# Patient Record
Sex: Female | Born: 1960
Health system: Southern US, Community
[De-identification: ages and names within clinical notes are randomized; demographics above are authoritative.]

## PROBLEM LIST (undated history)

## (undated) DIAGNOSIS — R0989 Other specified symptoms and signs involving the circulatory and respiratory systems: Secondary | ICD-10-CM

## (undated) DIAGNOSIS — H269 Unspecified cataract: Secondary | ICD-10-CM

## (undated) DIAGNOSIS — E119 Type 2 diabetes mellitus without complications: Secondary | ICD-10-CM

## (undated) DIAGNOSIS — E039 Hypothyroidism, unspecified: Secondary | ICD-10-CM

## (undated) DIAGNOSIS — M1990 Unspecified osteoarthritis, unspecified site: Secondary | ICD-10-CM

## (undated) DIAGNOSIS — E079 Disorder of thyroid, unspecified: Secondary | ICD-10-CM

## (undated) DIAGNOSIS — E785 Hyperlipidemia, unspecified: Secondary | ICD-10-CM

## (undated) DIAGNOSIS — R112 Nausea with vomiting, unspecified: Secondary | ICD-10-CM

## (undated) DIAGNOSIS — Z9889 Other specified postprocedural states: Secondary | ICD-10-CM

## (undated) DIAGNOSIS — J45909 Unspecified asthma, uncomplicated: Secondary | ICD-10-CM

## (undated) HISTORY — DX: Other specified symptoms and signs involving the circulatory and respiratory systems: R09.89

## (undated) HISTORY — PX: CATARACT EXTRACTION: SUR2

## (undated) HISTORY — PX: APPENDECTOMY: SHX54

## (undated) HISTORY — DX: Hyperlipidemia, unspecified: E78.5

## (undated) HISTORY — PX: BILATERAL CARPAL TUNNEL RELEASE: SHX6508

## (undated) HISTORY — DX: Type 2 diabetes mellitus without complications: E11.9

## (undated) HISTORY — DX: Unspecified cataract: H26.9

## (undated) HISTORY — PX: TUBAL LIGATION: SHX77

## (undated) HISTORY — DX: Disorder of thyroid, unspecified: E07.9

---

## 1998-01-28 ENCOUNTER — Other Ambulatory Visit: Admission: RE | Admit: 1998-01-28 | Discharge: 1998-01-28 | Payer: Self-pay | Admitting: Family Medicine

## 1999-10-29 ENCOUNTER — Encounter: Admission: RE | Admit: 1999-10-29 | Discharge: 2000-01-27 | Payer: Self-pay | Admitting: Endocrinology

## 2000-01-20 ENCOUNTER — Other Ambulatory Visit: Admission: RE | Admit: 2000-01-20 | Discharge: 2000-01-20 | Payer: Self-pay | Admitting: Family Medicine

## 2001-01-18 ENCOUNTER — Ambulatory Visit (HOSPITAL_BASED_OUTPATIENT_CLINIC_OR_DEPARTMENT_OTHER): Admission: RE | Admit: 2001-01-18 | Discharge: 2001-01-18 | Payer: Self-pay | Admitting: Orthopedic Surgery

## 2001-10-25 ENCOUNTER — Other Ambulatory Visit: Admission: RE | Admit: 2001-10-25 | Discharge: 2001-10-25 | Payer: Self-pay | Admitting: Family Medicine

## 2003-11-30 ENCOUNTER — Other Ambulatory Visit: Admission: RE | Admit: 2003-11-30 | Discharge: 2003-11-30 | Payer: Self-pay | Admitting: Family Medicine

## 2006-12-14 ENCOUNTER — Other Ambulatory Visit: Admission: RE | Admit: 2006-12-14 | Discharge: 2006-12-14 | Payer: Self-pay | Admitting: Family Medicine

## 2011-06-02 ENCOUNTER — Encounter (INDEPENDENT_AMBULATORY_CARE_PROVIDER_SITE_OTHER): Payer: Self-pay | Admitting: Internal Medicine

## 2011-06-02 ENCOUNTER — Ambulatory Visit (INDEPENDENT_AMBULATORY_CARE_PROVIDER_SITE_OTHER): Payer: BC Managed Care – PPO | Admitting: Internal Medicine

## 2011-06-02 VITALS — BP 130/60 | Temp 98.2°F | Ht 67.0 in | Wt 165.9 lb

## 2011-06-02 DIAGNOSIS — R197 Diarrhea, unspecified: Secondary | ICD-10-CM

## 2011-06-02 DIAGNOSIS — O2432 Unspecified pre-existing diabetes mellitus in childbirth: Secondary | ICD-10-CM

## 2011-06-02 NOTE — Patient Instructions (Signed)
Stop Miralax. Fiber pills. Try FiberOne bars. Stool diary and take pictures. OV in 1 month.

## 2011-06-02 NOTE — Progress Notes (Signed)
Subjective:     Patient ID: Alejandra Mcdonald, female   DOB: Dec 24, 1960, 50 y.o.   MRN: [id number]  HPI  Alejandra Mcdonald presents today with c/o  diarrhea  She says her stools are loose. She is having one loose stool a week.  She says she is passing a lot of gas.  She says her stools are never solid. This has been going on for about 5 months. She is having a BM about once a week.  She did take a Doculax this weekend and it was liquid.   Her appetite is good. No weight loss. No melena or bright red rectal bleeding.   She says she was having a BM about twice a week 5 months ago .Her last colonoscopy was 3-4 yrs ago and was normal by Dr. Gabriel Cirri. She has not been on recent antibiotics and she has not been out of the country.  Review of Systems See hpi Current Outpatient Prescriptions  Medication Sig Dispense Refill  . aspirin 81 MG tablet Take 81 mg by mouth daily.        . calcium citrate-vitamin D 200-200 MG-UNIT TABS Take by mouth daily.        . insulin lispro (HUMALOG) 100 UNIT/ML injection Inject into the skin 3 (three) times daily before meals.        Marland Kitchen levothyroxine (SYNTHROID, LEVOTHROID) 175 MCG tablet Take 175 mcg by mouth daily.        . Multiple Vitamins-Minerals (MULTIVITAMIN,TX-MINERALS) tablet Take 1 tablet by mouth daily.        . polyethylene glycol (MIRALAX / GLYCOLAX) packet Take 17 g by mouth daily.        . rosuvastatin (CRESTOR) 10 MG tablet Take 10 mg by mouth daily.         her History   Social History Narrative  . No narrative on file   History   Social History  . Marital Status: Married    Spouse Name: N/A    Number of Children: N/A  . Years of Education: N/A   Occupational History  . Not on file.   Social History Main Topics  . Smoking status: Never Smoker   . Smokeless tobacco: Not on file  . Alcohol Use: No  . Drug Use: No  . Sexually Active: Not on file   Other Topics Concern  . Not on file   Social History Narrative  . No narrative on file   No family  history on file. No family status information on file.   No Known Allergies     Objective:   Physical Exam   Filed Vitals:   06/02/11 1619  BP: 130/60  Temp: 98.2 F (36.8 C)  Height: 5\' 7"  (1.702 m)  Weight: 165 lb 14.4 oz (75.252 kg)    Alert and oriented. Skin warm and dry. Oral mucosa is moist. Natural teeth in good condition. Sclera anicteric, conjunctivae is pink. Thyroid not enlarged. No cervical lymphadenopathy. Lungs clear. Heart regular rate and rhythm.  Abdomen is soft. Bowel sounds are positive. No hepatomegaly. No abdominal masses felt. No tenderness.  No edema to lower extremities. Patient is alert and oriented. Stool brown and guaiac negative.     Assessment:    Diarrhea, change in stools. Stool guaiac negative today. No impaction.     Plan:     Fiber daily. Stop Miralax. Stool diary. Take pictures of stool when you have one.   I discussed this case with Dr. Karilyn Cota. She will have  OV in one month.

## 2011-06-30 ENCOUNTER — Ambulatory Visit (INDEPENDENT_AMBULATORY_CARE_PROVIDER_SITE_OTHER): Payer: BC Managed Care – PPO | Admitting: Internal Medicine

## 2012-11-07 ENCOUNTER — Encounter: Payer: Self-pay | Admitting: General Practice

## 2012-11-07 ENCOUNTER — Ambulatory Visit (INDEPENDENT_AMBULATORY_CARE_PROVIDER_SITE_OTHER): Payer: BC Managed Care – PPO | Admitting: General Practice

## 2012-11-07 VITALS — BP 130/73 | HR 78 | Temp 98.1°F | Ht 67.0 in | Wt 158.0 lb

## 2012-11-07 DIAGNOSIS — L0390 Cellulitis, unspecified: Secondary | ICD-10-CM

## 2012-11-07 DIAGNOSIS — L0291 Cutaneous abscess, unspecified: Secondary | ICD-10-CM

## 2012-11-07 MED ORDER — CIPROFLOXACIN HCL 500 MG PO TABS: 500.0000 mg | ORAL_TABLET | Freq: Two times a day (BID) | ORAL | Status: DC

## 2012-11-07 NOTE — Patient Instructions (Addendum)
Abscess An abscess is an infected area that contains a collection of pus and debris.It can occur in almost any part of the body. An abscess is also known as a furuncle or boil. CAUSES  An abscess occurs when tissue gets infected. This can occur from blockage of oil or sweat glands, infection of hair follicles, or a minor injury to the skin. As the body tries to fight the infection, pus collects in the area and creates pressure under the skin. This pressure causes pain. People with weakened immune systems have difficulty fighting infections and get certain abscesses more often.  SYMPTOMS Usually an abscess develops on the skin and becomes a painful mass that is red, warm, and tender. If the abscess forms under the skin, you may feel a moveable soft area under the skin. Some abscesses break open (rupture) on their own, but most will continue to get worse without care. The infection can spread deeper into the body and eventually into the bloodstream, causing you to feel ill.  DIAGNOSIS  Your caregiver will take your medical history and perform a physical exam. A sample of fluid may also be taken from the abscess to determine what is causing your infection. TREATMENT  Your caregiver may prescribe antibiotic medicines to fight the infection. However, taking antibiotics alone usually does not cure an abscess. Your caregiver may need to make a small cut (incision) in the abscess to drain the pus. In some cases, gauze is packed into the abscess to reduce pain and to continue draining the area. HOME CARE INSTRUCTIONS   Only take over-the-counter or prescription medicines for pain, discomfort, or fever as directed by your caregiver.  If you were prescribed antibiotics, take them as directed. Finish them even if you start to feel better.  If gauze is used, follow your caregiver's directions for changing the gauze.  To avoid spreading the infection:  Keep your draining abscess covered with a  bandage.  Wash your hands well.  Do not share personal care items, towels, or whirlpools with others.  Avoid skin contact with others.  Keep your skin and clothes clean around the abscess.  Keep all follow-up appointments as directed by your caregiver. SEEK MEDICAL CARE IF:   You have increased pain, swelling, redness, fluid drainage, or bleeding.  You have muscle aches, chills, or a general ill feeling.  You have a fever. MAKE SURE YOU:   Understand these instructions.  Will watch your condition.  Will get help right away if you are not doing well or get worse. Document Released: 05/13/2005 Document Revised: 02/02/2012 Document Reviewed: 10/16/2011 [employer] Patient Information 2013 [employer]. Cellulitis Cellulitis is an infection of the skin and the tissue beneath it. The infected area is usually red and tender. Cellulitis occurs most often in the arms and lower legs.  CAUSES  Cellulitis is caused by bacteria that enter the skin through cracks or cuts in the skin. The most common types of bacteria that cause cellulitis are Staphylococcus and Streptococcus. SYMPTOMS   Redness and warmth.  Swelling.  Tenderness or pain.  Fever. DIAGNOSIS  Your caregiver can usually determine what is wrong based on a physical exam. Blood tests may also be done. TREATMENT  Treatment usually involves taking an antibiotic medicine. HOME CARE INSTRUCTIONS   Take your antibiotics as directed. Finish them even if you start to feel better.  Keep the infected arm or leg elevated to reduce swelling.  Apply a warm cloth to the affected area up to 4   times per day to relieve pain.  Only take over-the-counter or prescription medicines for pain, discomfort, or fever as directed by your caregiver.  Keep all follow-up appointments as directed by your caregiver. SEEK MEDICAL CARE IF:   You notice red streaks coming from the infected area.  Your red area gets larger or turns dark in  color.  Your bone or joint underneath the infected area becomes painful after the skin has healed.  Your infection returns in the same area or another area.  You notice a swollen bump in the infected area.  You develop new symptoms. SEEK IMMEDIATE MEDICAL CARE IF:   You have a fever.  You feel very sleepy.  You develop vomiting or diarrhea.  You have a general ill feeling (malaise) with muscle aches and pains. MAKE SURE YOU:   Understand these instructions.  Will watch your condition.  Will get help right away if you are not doing well or get worse. Document Released: 05/13/2005 Document Revised: 02/02/2012 Document Reviewed: 10/19/2011 [employer] Patient Information 2013 [employer].   

## 2012-11-07 NOTE — Progress Notes (Signed)
  Subjective:    Patient ID: Alejandra Mcdonald, female    DOB: February 15, 1961, 52 y.o.   MRN: [id number]  HPI Patient reports area to abdomen (right lower) was red and tender upon removing needle from insulin pump. This was noticed on Wednesday morning. Reports greenish drainage, very small amount which only occurred once. Patient reports cleansing area with soap water, peroxide, and applied neosporin. Reports changing sites every three days. Reports pain in area, worse with clothes rubbing against site.     Review of Systems  Constitutional: Negative for fever and chills.  Respiratory: Negative for chest tightness and shortness of breath.   Cardiovascular: Negative for chest pain and palpitations.  Gastrointestinal: Negative for abdominal pain and abdominal distention.  Genitourinary: Negative for difficulty urinating.  Skin:       Right lower abdomen area red, swollen, and warm to touch.   Neurological: Negative for dizziness and headaches.  Psychiatric/Behavioral: Positive for behavioral problems.       Objective:   Physical Exam  Constitutional: She is oriented to person, place, and time. She appears well-developed and well-nourished.  Cardiovascular: Normal rate, regular rhythm, normal heart sounds and intact distal pulses.   No murmur heard. Pulmonary/Chest: Effort normal and breath sounds normal. No respiratory distress. She exhibits no tenderness.  Abdominal: Soft. Bowel sounds are normal. She exhibits no distension. There is no tenderness.  Neurological: She is alert and oriented to person, place, and time.  Skin: Skin is warm and dry. There is erythema.  Right lower quadrant area has erythematous, firm, 1 inch diameter and warm to touch. Negative drainage noted.   Psychiatric: She has a normal mood and affect.          Assessment & Plan:  Complete antibiotics even if feeling better Increase fluid intake Return to office prn Keep appointment scheduled for next week  Raymon Mutton, FNP-C

## 2012-11-12 ENCOUNTER — Encounter: Payer: Self-pay | Admitting: Physician Assistant

## 2012-11-12 ENCOUNTER — Ambulatory Visit (INDEPENDENT_AMBULATORY_CARE_PROVIDER_SITE_OTHER): Payer: BC Managed Care – PPO | Admitting: Physician Assistant

## 2012-11-12 VITALS — BP 120/70 | HR 71 | Temp 98.0°F | Ht 67.0 in | Wt 157.0 lb

## 2012-11-12 DIAGNOSIS — L0291 Cutaneous abscess, unspecified: Secondary | ICD-10-CM

## 2012-11-12 DIAGNOSIS — E119 Type 2 diabetes mellitus without complications: Secondary | ICD-10-CM

## 2012-11-12 MED ORDER — SULFAMETHOXAZOLE-TRIMETHOPRIM 800-160 MG PO TABS: 1.0000 | ORAL_TABLET | Freq: Two times a day (BID) | ORAL | Status: DC

## 2012-11-14 NOTE — Progress Notes (Signed)
  Subjective:    Patient ID: Alejandra Mcdonald, female    DOB: 1961/01/15, 52 y.o.   MRN: [id number]  HPI Diabetic, using an insulin pump; red warm lesion on RLQ of abdomin at site of insulin pump; using Cipro    Review of Systems  Constitutional: Positive for chills.  Skin: Positive for color change, pallor and wound.  All other systems reviewed and are negative.       Objective:   Physical Exam  Vitals reviewed. Constitutional: She is oriented to person, place, and time. She appears well-developed and well-nourished.  Neurological: She is alert and oriented to person, place, and time.  Skin: There is erythema.  6cm erythematous lesion on RLQ abdomin  Psychiatric: She has a normal mood and affect. Her behavior is normal. Judgment and thought content normal.          Assessment & Plan:  Cellulitis and abscess - Plan: sulfamethoxazole-trimethoprim (BACTRIM DS,SEPTRA DS) 800-160 MG per tablet  Diabetes - Plan: sulfamethoxazole-trimethoprim (BACTRIM DS,SEPTRA DS) 800-160 MG per tablet

## 2012-11-18 ENCOUNTER — Ambulatory Visit (INDEPENDENT_AMBULATORY_CARE_PROVIDER_SITE_OTHER): Payer: BC Managed Care – PPO | Admitting: Nurse Practitioner

## 2012-11-18 ENCOUNTER — Encounter: Payer: Self-pay | Admitting: Nurse Practitioner

## 2012-11-18 VITALS — BP 120/69 | HR 70 | Temp 98.4°F | Ht 67.0 in | Wt 155.0 lb

## 2012-11-18 DIAGNOSIS — E109 Type 1 diabetes mellitus without complications: Secondary | ICD-10-CM

## 2012-11-18 DIAGNOSIS — K219 Gastro-esophageal reflux disease without esophagitis: Secondary | ICD-10-CM

## 2012-11-18 DIAGNOSIS — E039 Hypothyroidism, unspecified: Secondary | ICD-10-CM

## 2012-11-18 DIAGNOSIS — K5900 Constipation, unspecified: Secondary | ICD-10-CM

## 2012-11-18 DIAGNOSIS — E1069 Type 1 diabetes mellitus with other specified complication: Secondary | ICD-10-CM | POA: Insufficient documentation

## 2012-11-18 DIAGNOSIS — E119 Type 2 diabetes mellitus without complications: Secondary | ICD-10-CM | POA: Insufficient documentation

## 2012-11-18 DIAGNOSIS — E785 Hyperlipidemia, unspecified: Secondary | ICD-10-CM | POA: Insufficient documentation

## 2012-11-18 LAB — COMPLETE METABOLIC PANEL WITH GFR
Albumin: 4.2 g/dL (ref 3.5–5.2)
BUN: 10 mg/dL (ref 6–23)
CO2: 31 mEq/L (ref 19–32)
Calcium: 10 mg/dL (ref 8.4–10.5)
Chloride: 100 mEq/L (ref 96–112)
GFR, Est Non African American: 72 mL/min
Glucose, Bld: 153 mg/dL — ABNORMAL HIGH (ref 70–99)
Potassium: 4.7 mEq/L (ref 3.5–5.3)

## 2012-11-18 LAB — THYROID PANEL WITH TSH: Free Thyroxine Index: 4.2 — ABNORMAL HIGH (ref 1.0–3.9)

## 2012-11-18 MED ORDER — SIMVASTATIN 10 MG PO TABS: 10.0000 mg | ORAL_TABLET | Freq: Every day | ORAL | Status: DC

## 2012-11-18 MED ORDER — LEVOTHYROXINE SODIUM 175 MCG PO TABS: 175.0000 ug | ORAL_TABLET | Freq: Every day | ORAL | Status: DC

## 2012-11-18 MED ORDER — LACTULOSE 20 GM/30ML PO SOLN: 17.0000 g | Freq: Three times a day (TID) | ORAL | Status: DC

## 2012-11-18 MED ORDER — OMEPRAZOLE 40 MG PO CPDR: 40.0000 mg | DELAYED_RELEASE_CAPSULE | Freq: Every day | ORAL | Status: DC

## 2012-11-18 NOTE — Progress Notes (Signed)
Subjective:    Patient ID: Alejandra Mcdonald, female    DOB: 1960/08/27, 52 y.o.   MRN: [id number]  Diabetes She presents for her follow-up diabetic visit. She has type 1 diabetes mellitus. No MedicAlert identification noted. The initial diagnosis of diabetes was made 33 years ago. Her disease course has been fluctuating. There are no hypoglycemic associated symptoms. Pertinent negatives for hypoglycemia include no confusion, dizziness, nervousness/anxiousness or tremors. Pertinent negatives for diabetes include no blurred vision, no chest pain, no fatigue, no foot ulcerations, no polydipsia, no polyphagia, no weakness and no weight loss. There are no hypoglycemic complications. Symptoms are stable. There are no diabetic complications. Risk factors for coronary artery disease include dyslipidemia. Current diabetic treatment includes insulin pump. She is compliant with treatment all of the time. Her weight is stable. She is following a generally healthy diet. When asked about meal planning, she reported none. She has not had a previous visit with a dietician. She participates in exercise daily. She monitors blood glucose at home 5+ x per day. Her home blood glucose trend is fluctuating dramatically. Her overall blood glucose range is >200 mg/dl. She does not see a podiatrist.Eye exam is current.  Hyperlipidemia This is a chronic problem. The current episode started more than 1 year ago. The problem is uncontrolled. Exacerbating diseases include diabetes and hypothyroidism. Pertinent negatives include no chest pain, leg pain, myalgias or shortness of breath. Current antihyperlipidemic treatment includes statins. There are no compliance problems.  Risk factors for coronary artery disease include diabetes mellitus.  Hypothyroidism This is a chronic problem. Currently on Zocor. Taking daily as Rx. Exercises 5x/week. Walks 10,000 steps a day. Weight watchers diet  Complaint of dry cough, worse at night and while  lying down. Was on Ace-Inhibitor but stopped due to cough but cough still remains.  Review of Systems  Constitutional: Negative for weight loss and fatigue.  Eyes: Negative for blurred vision.  Respiratory: Positive for cough (Nonproductive). Negative for shortness of breath.   Cardiovascular: Negative for chest pain.  Endocrine: Negative for polydipsia and polyphagia.  Musculoskeletal: Negative for myalgias.  Neurological: Negative for dizziness, tremors and weakness.  Psychiatric/Behavioral: Negative for confusion. The patient is not nervous/anxious.   All other systems reviewed and are negative.       Objective:   Physical Exam  Constitutional: She is oriented to person, place, and time. She appears well-developed and well-nourished.  HENT:  Head: Normocephalic.  Right Ear: External ear normal.  Left Ear: External ear normal.  Nose: Nose normal.  Mouth/Throat: Oropharynx is clear and moist.  Eyes: Conjunctivae and EOM are normal. Pupils are equal, round, and reactive to light.  Neck: Normal range of motion. Neck supple.  Cardiovascular: Normal rate, regular rhythm, normal heart sounds and intact distal pulses.   Pulmonary/Chest: Effort normal and breath sounds normal.  Abdominal: Soft. Bowel sounds are normal.  Musculoskeletal: Normal range of motion.  Neurological: She is alert and oriented to person, place, and time.  Skin: Skin is warm and dry.  Psychiatric: She has a normal mood and affect. Her behavior is normal. Judgment and thought content normal.     Results for orders placed in visit on 11/18/12  POCT GLYCOSYLATED HEMOGLOBIN (HGB A1C)      Result Value Range   Hemoglobin A1C 7.7     BP 120/69  Pulse 70  Temp(Src) 98.4 F (36.9 C) (Oral)  Ht 5\' 7"  (1.702 m)  Wt 155 lb (70.308 kg)  BMI 24.27 kg/m2  Assessment & Plan:  1. Other and unspecified hyperlipidemia Continue diet and exercise - COMPLETE METABOLIC PANEL WITH GFR - NMR Lipoprofile with  Lipids - simvastatin (ZOCOR) 10 MG tablet; Take 1 tablet (10 mg total) by mouth at bedtime.  Dispense: 30 tablet; Refill: 5  2. Unspecified hypothyroidism  - COMPLETE METABOLIC PANEL WITH GFR - NMR Lipoprofile with Lipids - Thyroid Panel With TSH - levothyroxine (SYNTHROID, LEVOTHROID) 175 MCG tablet; Take 1 tablet (175 mcg total) by mouth daily.  Dispense: 30 tablet; Refill: 5   3. Type 1 diabetes mellitus with target hemoglobin A1c of less than 7.5 percent Pt to increase basal dose Call if blood sugars not improving - Microalbumin, urine - POCT glycosylated hemoglobin (Hb A1C) - COMPLETE METABOLIC PANEL WITH GFR  5. GERD (gastroesophageal reflux disease) Avoid spicy and fatty foods Avoid eating 2 hrs prior to bedtime - omeprazole (PRILOSEC) 40 MG capsule; Take 1 capsule (40 mg total) by mouth daily.  Dispense: 30 capsule; Refill: 5  6. Unspecified constipation Increase fiber - Lactulose 20 GM/30ML SOLN; Take 25.5 mLs (17 g total) by mouth 3 (three) times daily.  Dispense: 90 mL; Refill: 5  Mary-Margaret Daphine Deutscher, FNP

## 2012-11-18 NOTE — Patient Instructions (Addendum)
Other and unspecified hyperlipidemia Continue diet and exercise - COMPLETE METABOLIC PANEL WITH GFR - NMR Lipoprofile with Lipids - simvastatin (ZOCOR) 10 MG tablet; Take 1 tablet (10 mg total) by mouth at bedtime.  Dispense: 30 tablet; Refill: 5  2. Unspecified hypothyroidism  - COMPLETE METABOLIC PANEL WITH GFR - NMR Lipoprofile with Lipids - Thyroid Panel With TSH - levothyroxine (SYNTHROID, LEVOTHROID) 175 MCG tablet; Take 1 tablet (175 mcg total) by mouth daily.  Dispense: 30 tablet; Refill: 5   3. Type 1 diabetes mellitus with target hemoglobin A1c of less than 7.5 percent Pt to increase basal dose Call if blood sugars not improving - Microalbumin, urine - POCT glycosylated hemoglobin (Hb A1C) - COMPLETE METABOLIC PANEL WITH GFR  5. GERD (gastroesophageal reflux disease) Avoid spicy and fatty foods Avoid eating 2 hrs prior to bedtime - omeprazole (PRILOSEC) 40 MG capsule; Take 1 capsule (40 mg total) by mouth daily.  Dispense: 30 capsule; Refill: 5  6. Unspecified constipation Increase fiber - Lactulose 20 GM/30ML SOLN; Take 25.5 mLs (17 g total) by mouth 3 (three) times daily.  Dispense: 90 mL; Refill: 5

## 2012-11-19 LAB — MICROALBUMIN, URINE: Microalb, Ur: 0.5 mg/dL (ref 0.00–1.89)

## 2012-11-21 ENCOUNTER — Other Ambulatory Visit: Payer: Self-pay | Admitting: Nurse Practitioner

## 2012-11-21 LAB — NMR LIPOPROFILE WITH LIPIDS
HDL Size: 10 nm (ref >=9.2)
LDL Particle Number: 739 nmol/L (ref <1000)
Large HDL-P: 14 umol/L (ref >=4.8)
Large VLDL-P: 0.8 nmol/L (ref <=2.7)
Small LDL Particle Number: 172 nmol/L (ref <=527)
VLDL Size: 43 nm (ref <=46.6)

## 2012-11-21 MED ORDER — LEVOTHYROXINE SODIUM 150 MCG PO TABS: 150.0000 ug | ORAL_TABLET | Freq: Every day | ORAL | Status: DC

## 2012-12-28 ENCOUNTER — Other Ambulatory Visit: Payer: Self-pay | Admitting: Nurse Practitioner

## 2013-01-23 ENCOUNTER — Other Ambulatory Visit: Payer: Self-pay | Admitting: Nurse Practitioner

## 2013-02-14 ENCOUNTER — Ambulatory Visit: Payer: BC Managed Care – PPO | Admitting: Family Medicine

## 2013-02-20 ENCOUNTER — Telehealth: Payer: Self-pay | Admitting: Family Medicine

## 2013-02-21 ENCOUNTER — Ambulatory Visit: Payer: BC Managed Care – PPO | Admitting: Nurse Practitioner

## 2013-03-17 ENCOUNTER — Other Ambulatory Visit: Payer: Self-pay | Admitting: Nurse Practitioner

## 2013-03-17 NOTE — Telephone Encounter (Signed)
Last seen 11/18/12 MMM last glucose level 11/18/12

## 2013-04-10 ENCOUNTER — Ambulatory Visit (INDEPENDENT_AMBULATORY_CARE_PROVIDER_SITE_OTHER): Payer: BC Managed Care – PPO | Admitting: Nurse Practitioner

## 2013-04-10 ENCOUNTER — Encounter: Payer: Self-pay | Admitting: Nurse Practitioner

## 2013-04-10 VITALS — BP 137/65 | HR 95 | Temp 97.7°F | Ht 67.0 in | Wt 158.0 lb

## 2013-04-10 DIAGNOSIS — M4306 Spondylolysis, lumbar region: Secondary | ICD-10-CM

## 2013-04-10 DIAGNOSIS — E785 Hyperlipidemia, unspecified: Secondary | ICD-10-CM

## 2013-04-10 DIAGNOSIS — E109 Type 1 diabetes mellitus without complications: Secondary | ICD-10-CM

## 2013-04-10 DIAGNOSIS — M4310 Spondylolisthesis, site unspecified: Secondary | ICD-10-CM

## 2013-04-10 DIAGNOSIS — E039 Hypothyroidism, unspecified: Secondary | ICD-10-CM

## 2013-04-10 DIAGNOSIS — K5900 Constipation, unspecified: Secondary | ICD-10-CM

## 2013-04-10 LAB — POCT GLYCOSYLATED HEMOGLOBIN (HGB A1C): Hemoglobin A1C: 6.7

## 2013-04-10 MED ORDER — SIMVASTATIN 40 MG PO TABS: 40.0000 mg | ORAL_TABLET | Freq: Every evening | ORAL | Status: DC

## 2013-04-10 MED ORDER — LINACLOTIDE 145 MCG PO CAPS: 145.0000 ug | ORAL_CAPSULE | Freq: Every day | ORAL | Status: DC

## 2013-04-10 MED ORDER — INSULIN GLULISINE 100 UNIT/ML IJ SOLN: INTRAMUSCULAR | Status: DC

## 2013-04-10 MED ORDER — CELECOXIB 200 MG PO CAPS: 200.0000 mg | ORAL_CAPSULE | Freq: Every day | ORAL | Status: DC

## 2013-04-10 MED ORDER — LEVOTHYROXINE SODIUM 200 MCG PO TABS: 200.0000 ug | ORAL_TABLET | Freq: Every day | ORAL | Status: DC

## 2013-04-10 NOTE — Progress Notes (Signed)
Subjective:    Patient ID: Alejandra Mcdonald, female    DOB: 24-Mar-1961, 52 y.o.   MRN: [id number]  Diabetes She presents for her follow-up diabetic visit. She has type 1 diabetes mellitus. No MedicAlert identification noted. The initial diagnosis of diabetes was made 33 years ago. Her disease course has been fluctuating. There are no hypoglycemic associated symptoms. Pertinent negatives for hypoglycemia include no confusion, dizziness, nervousness/anxiousness or tremors. Pertinent negatives for diabetes include no blurred vision, no chest pain, no fatigue, no foot ulcerations, no polydipsia, no polyphagia, no weakness and no weight loss. There are no hypoglycemic complications. Symptoms are stable. There are no diabetic complications. Risk factors for coronary artery disease include dyslipidemia. Current diabetic treatment includes insulin pump. She is compliant with treatment all of the time. Her weight is stable. She is following a generally healthy diet. When asked about meal planning, she reported none. She has not had a previous visit with a dietician. She participates in exercise daily. She monitors blood glucose at home 5+ x per day. Her home blood glucose trend is fluctuating dramatically. Her breakfast blood glucose is taken between 8-9 am. Her breakfast blood glucose range is generally 110-130 mg/dl. Her lunch blood glucose is taken between 1-2 pm. Her lunch blood glucose range is generally 130-140 mg/dl. Her dinner blood glucose is taken between 7-8 pm. Her dinner blood glucose range is generally 130-140 mg/dl. Her highest blood glucose is >200 mg/dl. Her overall blood glucose range is 130-140 mg/dl. She does not see a podiatrist.Eye exam is current.  Hyperlipidemia This is a chronic problem. The current episode started more than 1 year ago. The problem is uncontrolled. Exacerbating diseases include diabetes and hypothyroidism. Pertinent negatives include no chest pain, leg pain, myalgias or shortness  of breath. Current antihyperlipidemic treatment includes statins. There are no compliance problems.  Risk factors for coronary artery disease include diabetes mellitus.  Hypothyroidism This is a chronic problem. Currently on Zocor. Taking daily as Rx. Exercises 5x/week. Walks 10,000 steps a day. Weight watchers diet Lumbar Pars defect Recently diagnosed and is going to see specialist in  About 2 weeks. Constipation Wants to try linzess- Patient says she stays constipated Review of Systems  Constitutional: Negative for weight loss and fatigue.  Eyes: Negative for blurred vision.  Respiratory: Positive for cough (Nonproductive). Negative for shortness of breath.   Cardiovascular: Negative for chest pain.  Endocrine: Negative for polydipsia and polyphagia.  Musculoskeletal: Negative for myalgias.  Neurological: Negative for dizziness, tremors and weakness.  Psychiatric/Behavioral: Negative for confusion. The patient is not nervous/anxious.   All other systems reviewed and are negative.       Objective:   Physical Exam  Constitutional: She is oriented to person, place, and time. She appears well-developed and well-nourished.  HENT:  Head: Normocephalic.  Right Ear: External ear normal.  Left Ear: External ear normal.  Nose: Nose normal.  Mouth/Throat: Oropharynx is clear and moist.  Eyes: Conjunctivae and EOM are normal. Pupils are equal, round, and reactive to light.  Neck: Normal range of motion. Neck supple.  Cardiovascular: Normal rate, regular rhythm, normal heart sounds and intact distal pulses.   Pulmonary/Chest: Effort normal and breath sounds normal.  Abdominal: Soft. Bowel sounds are normal.  Musculoskeletal: Normal range of motion.  Neurological: She is alert and oriented to person, place, and time.  Skin: Skin is warm and dry.  Psychiatric: She has a normal mood and affect. Her behavior is normal. Judgment and thought content normal.  BP 137/65  Pulse 95   Temp(Src) 97.7 F (36.5 C) (Oral)  Ht 5\' 7"  (1.702 m)  Wt 158 lb (71.668 kg)  BMI 24.74 kg/m2     Results for orders placed in visit on 04/10/13  POCT GLYCOSYLATED HEMOGLOBIN (HGB A1C)      Result Value Range   Hemoglobin A1C 6.7%      Assessment & Plan:  1. Other and unspecified hyperlipidemia Continue diet and exercise - COMPLETE METABOLIC PANEL WITH GFR - NMR Lipoprofile with Lipids - simvastatin (ZOCOR) 10 MG tablet; Take 1 tablet (10 mg total) by mouth at bedtime.  Dispense: 30 tablet; Refill: 5  2. Unspecified hypothyroidism  - COMPLETE METABOLIC PANEL WITH GFR - NMR Lipoprofile with Lipids - Thyroid Panel With TSH - levothyroxine (SYNTHROID, LEVOTHROID) 175 MCG tablet; Take 1 tablet (175 mcg total) by mouth daily.  Dispense: 30 tablet; Refill: 5   3. Type 1 diabetes mellitus with target hemoglobin A1c of less than 7.5 percent Pt to increase basal dose Call if blood sugars not improving - Microalbumin, urine - POCT glycosylated hemoglobin (Hb A1C) - COMPLETE METABOLIC PANEL WITH GFR  5. GERD (gastroesophageal reflux disease) Avoid spicy and fatty foods Avoid eating 2 hrs prior to bedtime - omeprazole (PRILOSEC) 40 MG capsule; Take 1 capsule (40 mg total) by mouth daily.  Dispense: 30 capsule; Refill: 5  6. Unspecified constipation Increase fiber -linzess 145mg  1 po qd samples  Mary-Margaret Daphine Deutscher, FNP

## 2013-04-10 NOTE — Patient Instructions (Addendum)

## 2013-04-12 LAB — SPECIMEN STATUS REPORT

## 2013-04-12 LAB — CMP14+EGFR
AST: 24 IU/L (ref 0–40)
Albumin/Globulin Ratio: 2.4 (ref 1.1–2.5)
BUN: 10 mg/dL (ref 6–24)
CO2: 27 mmol/L (ref 18–29)
Calcium: 9.6 mg/dL (ref 8.7–10.2)
Creatinine, Ser: 0.75 mg/dL (ref 0.57–1.00)
GFR calc non Af Amer: 91 mL/min/{1.73_m2} (ref >59)
Globulin, Total: 1.7 g/dL (ref 1.5–4.5)
Glucose: 243 mg/dL — ABNORMAL HIGH (ref 65–99)
Sodium: 140 mmol/L (ref 134–144)
Total Protein: 5.8 g/dL — ABNORMAL LOW (ref 6.0–8.5)

## 2013-04-12 LAB — NMR, LIPOPROFILE
HDL Cholesterol by NMR: 84 mg/dL (ref >=40)
HDL Particle Number: 44.9 umol/L (ref >=30.5)
LDLC SERPL CALC-MCNC: 86 mg/dL (ref <100)
Small LDL Particle Number: <90 nmol/L (ref <=527)
Triglycerides by NMR: 132 mg/dL (ref <150)

## 2013-04-12 LAB — THYROID PANEL WITH TSH
Free Thyroxine Index: 3.1 (ref 1.2–4.9)
T4, Total: 8.7 ug/dL (ref 4.5–12.0)
TSH: 0.073 u[IU]/mL — ABNORMAL LOW (ref 0.450–4.500)

## 2013-04-24 ENCOUNTER — Telehealth: Payer: Self-pay | Admitting: Nurse Practitioner

## 2013-04-24 DIAGNOSIS — K5900 Constipation, unspecified: Secondary | ICD-10-CM

## 2013-04-24 NOTE — Telephone Encounter (Signed)
Give linzess samples 240 mg

## 2013-04-25 MED ORDER — LINACLOTIDE 145 MCG PO CAPS: 145.0000 ug | ORAL_CAPSULE | Freq: Every day | ORAL | Status: DC

## 2013-04-25 NOTE — Telephone Encounter (Signed)
Currently do not have any samples

## 2013-04-25 NOTE — Telephone Encounter (Signed)
Only have samples of 290mg 

## 2013-04-25 NOTE — Telephone Encounter (Signed)
Can you please call it in then?  She also has very chapped lips her dentist gave her her loungers but it made it worse can  You please think of something else she can do?

## 2013-04-25 NOTE — Telephone Encounter (Signed)
vaseline for lips Linzess rx sent to pharmacy

## 2013-06-07 ENCOUNTER — Encounter: Payer: Self-pay | Admitting: Family Medicine

## 2013-06-07 ENCOUNTER — Ambulatory Visit (INDEPENDENT_AMBULATORY_CARE_PROVIDER_SITE_OTHER): Payer: BC Managed Care – PPO | Admitting: Family Medicine

## 2013-06-07 VITALS — BP 139/76 | HR 80 | Temp 98.5°F | Ht 66.5 in | Wt 162.4 lb

## 2013-06-07 DIAGNOSIS — M129 Arthropathy, unspecified: Secondary | ICD-10-CM

## 2013-06-07 DIAGNOSIS — M1990 Unspecified osteoarthritis, unspecified site: Secondary | ICD-10-CM

## 2013-06-07 MED ORDER — MELOXICAM 7.5 MG PO TABS: 7.5000 mg | ORAL_TABLET | Freq: Every day | ORAL | Status: DC

## 2013-06-07 MED ORDER — KETOROLAC TROMETHAMINE 30 MG/ML IJ SOLN
30.0000 mg | Freq: Once | INTRAMUSCULAR | Status: AC
  Administered 2013-06-07: 30 mg via INTRAMUSCULAR

## 2013-06-07 NOTE — Patient Instructions (Addendum)
Shoulder Pain The shoulder is the joint that connects your arms to your body. The bones that form the shoulder joint include the upper arm bone (humerus), the shoulder blade (scapula), and the collarbone (clavicle). The top of the humerus is shaped like a ball and fits into a rather flat socket on the scapula (glenoid cavity). A combination of muscles and strong, fibrous tissues that connect muscles to bones (tendons) support your shoulder joint and hold the ball in the socket. Small, fluid-filled sacs (bursae) are located in different areas of the joint. They act as cushions between the bones and the overlying soft tissues and help reduce friction between the gliding tendons and the bone as you move your arm. Your shoulder joint allows a wide range of motion in your arm. This range of motion allows you to do things like scratch your back or throw a ball. However, this range of motion also makes your shoulder more prone to pain from overuse and injury. Causes of shoulder pain can originate from both injury and overuse and usually can be grouped in the following four categories:  Redness, swelling, and pain (inflammation) of the tendon (tendinitis) or the bursae (bursitis).  Instability, such as a dislocation of the joint.  Inflammation of the joint (arthritis).  Broken bone (fracture). HOME CARE INSTRUCTIONS   Apply ice to the sore area.  Put ice in a plastic bag.  Place a towel between your skin and the bag.  Leave the ice on for 15-20 minutes, 3-4 times per day for the first 2 days.  Stop using cold packs if they do not help with the pain.  If you have a shoulder sling or immobilizer, wear it as long as your caregiver instructs. Only remove it to shower or bathe. Move your arm as little as possible, but keep your hand moving to prevent swelling.  Squeeze a soft ball or foam pad as much as possible to help prevent swelling.  Only take over-the-counter or prescription medicines for pain,  discomfort, or fever as directed by your caregiver. SEEK MEDICAL CARE IF:   Your shoulder pain increases, or new pain develops in your arm, hand, or fingers.  Your hand or fingers become cold and numb.  Your pain is not relieved with medicines. SEEK IMMEDIATE MEDICAL CARE IF:   Your arm, hand, or fingers are numb or tingling.  Your arm, hand, or fingers are significantly swollen or turn white or blue. MAKE SURE YOU:   Understand these instructions.  Will watch your condition.  Will get help right away if you are not doing well or get worse. Document Released: 05/13/2005 Document Revised: 04/27/2012 Document Reviewed: 07/18/2011 [facility] Patient Information 2014 [place], [place]. Ketorolac injection What is this medicine? KETOROLAC (kee toe ROLE ak) is a non-steroidal anti-inflammatory drug (NSAID). It is used to treat moderate to severe pain for up to 5 days. It is commonly used after surgery. This medicine should not be used for more than 5 days. This medicine may be used for other purposes; ask your health care provider or pharmacist if you have questions. What should I tell my health care provider before I take this medicine? They need to know if you have any of these conditions: -asthma, especially aspirin-sensitive asthma -bleeding problems -kidney disease -stomach bleed, ulcer, or other problem -taking aspirin, other NSAID, or probenecid -an unusual or allergic reaction to ketorolac, tromethamine, aspirin, other NSAIDs, other medicines, foods, dyes or preservatives -pregnant or trying to get pregnant -breast-feeding How should  I use this medicine? This medicine is for injection into a muscle or into a vein. It is given by a health care professional in a hospital or clinic setting. Talk to your pediatrician regarding the use of this medicine in children. While this drug may be prescribed for children as young as 82 years old for selected conditions, precautions do  apply. Patients over 18 years old may have a stronger reaction and need a smaller dose. Overdosage: If you think you have taken too much of this medicine contact a poison control center or emergency room at once. NOTE: This medicine is only for you. Do not share this medicine with others. What if I miss a dose? This does not apply. What may interact with this medicine? Do not take this medicine with any of the following medications: -aspirin and aspirin-like medicines -cidofovir -methotrexate -NSAIDs, medicines for pain and inflammation, like ibuprofen or naproxen -pentoxifylline -probenecid This medicine may also interact with the following medications: -alcohol -alendronate -alprazolam -carbamazepine -diuretics -flavocoxid -fluoxetine -ginkgo -lithium -medicines for blood pressure like enalapril -medicines that affect platelets like pentoxifylline -medicines that treat or prevent blood clots like heparin, warfarin -muscle relaxants -pemetrexed -phenytoin -thiothixene This list may not describe all possible interactions. Give your health care provider a list of all the medicines, herbs, non-prescription drugs, or dietary supplements you use. Also tell them if you smoke, drink alcohol, or use illegal drugs. Some items may interact with your medicine. What should I watch for while using this medicine? Tell your doctor or healthcare professional if your symptoms do not start to get better or if they get worse. This medicine does not prevent heart attack or stroke. In fact, this medicine may increase the chance of a heart attack or stroke. The chance may increase with longer use of this medicine and in people who have heart disease. If you take aspirin to prevent heart attack or stroke, talk with your doctor or health care professional. Do not take medicines such as ibuprofen and naproxen with this medicine. Side effects such as stomach upset, nausea, or ulcers may be more likely to  occur. Many medicines available without a prescription should not be taken with this medicine. This medicine can cause ulcers and bleeding in the stomach and intestines at any time during treatment. Do not smoke cigarettes or drink alcohol. These increase irritation to your stomach and can make it more susceptible to damage from this medicine. Ulcers and bleeding can happen without warning symptoms and can cause death. This medicine can cause you to bleed more easily. Try to avoid damage to your teeth and gums when you brush or floss your teeth. What side effects may I notice from receiving this medicine? Side effects that you should report to your doctor or health care professional as soon as possible: -allergic reactions like skin rash, itching or hives, swelling of the face, lips, or tongue -black or tarry stools -breathing problems -changes in vision -chest pain -high blood pressure -nausea, vomiting -redness, blistering, peeling or loosening of the skin, including inside the mouth -severe abdominal pain -slurred speech or weakness on one side of the body -trouble passing urine or change in the amount of urine -unexplained weight gain or swelling -unusual bleeding or bruising -unusually weak or tired -yellowing of eyes or skin Side effects that usually do not require medical attention (report to your doctor or health care professional if they continue or are bothersome): -diarrhea -dizziness -headache -heartburn This list may not describe all  possible side effects. Call your doctor for medical advice about side effects. You may report side effects to FDA at 1-800-FDA-1088. Where should I keep my medicine? This drug is given in a hospital or clinic and will not be stored at home. NOTE: This sheet is a summary. It may not cover all possible information. If you have questions about this medicine, talk to your doctor, pharmacist, or health care provider.  2013, [employer]vier/[employer].  (12/22/2007 5:24:50 PM)

## 2013-06-07 NOTE — Progress Notes (Signed)
Tolerated toradol 30mg  well without difficulty

## 2013-06-07 NOTE — Progress Notes (Signed)
  Subjective:    Patient ID: Alejandra Mcdonald, female    DOB: [birth year], 52 y.o.   MRN: [id number]  HPI This 52 y.o. female presents for evaluation of right shoulder discomfort for 2 days.  She denies Any injury.  She has hx of OA and she is having arthritis issues especially when she gets up in the Am.   Review of Systems C/o arthritis No chest pain, SOB, HA, dizziness, vision change, N/V, diarrhea, constipation, dysuria, urinary urgency  or rash.     Objective:   Physical Exam Vital signs noted  Well developed well nourished female.  HEENT - Head atraumatic Normocephalic                Eyes - PERRLA, Conjuctiva - clear Sclera- Clear EOMI                Ears - EAC's Wnl TM's Wnl Gross Hearing WNL                Nose - Nares patent                 Throat - oropharanx wnl Respiratory - Lungs CTA bilateral Cardiac - RRR S1 and S2 without murmur MS - Tenderness with abduction, external and internal rotation right arm.       Assessment & Plan:  Arthritis - Plan: ketorolac (TORADOL) 30 MG/ML injection 30 mg, meloxicam (MOBIC) 7.5 MG tablet, DISCONTINUED: meloxicam (MOBIC) 7.5 MG tablet Discussed doing circumduction and walk up the wall exercises.  Discussed follow up if not better.  Deatra Canter FNP

## 2013-07-19 ENCOUNTER — Other Ambulatory Visit: Payer: Self-pay | Admitting: Nurse Practitioner

## 2013-08-28 ENCOUNTER — Emergency Department (HOSPITAL_COMMUNITY)
Admission: EM | Admit: 2013-08-28 | Discharge: 2013-08-28 | Disposition: A | Discharge status: Home / Self Care | Payer: BC Managed Care – PPO | Attending: Emergency Medicine | Admitting: Emergency Medicine

## 2013-08-28 ENCOUNTER — Emergency Department (HOSPITAL_COMMUNITY): Payer: BC Managed Care – PPO

## 2013-08-28 ENCOUNTER — Encounter (HOSPITAL_COMMUNITY): Payer: Self-pay | Admitting: Emergency Medicine

## 2013-08-28 ENCOUNTER — Ambulatory Visit (INDEPENDENT_AMBULATORY_CARE_PROVIDER_SITE_OTHER): Payer: BC Managed Care – PPO | Admitting: Family Medicine

## 2013-08-28 ENCOUNTER — Encounter: Payer: Self-pay | Admitting: Family Medicine

## 2013-08-28 VITALS — BP 143/77 | HR 81 | Temp 98.7°F | Ht 67.0 in | Wt 162.4 lb

## 2013-08-28 DIAGNOSIS — Z79899 Other long term (current) drug therapy: Secondary | ICD-10-CM | POA: Insufficient documentation

## 2013-08-28 DIAGNOSIS — E119 Type 2 diabetes mellitus without complications: Secondary | ICD-10-CM | POA: Insufficient documentation

## 2013-08-28 DIAGNOSIS — Z7982 Long term (current) use of aspirin: Secondary | ICD-10-CM | POA: Insufficient documentation

## 2013-08-28 DIAGNOSIS — J209 Acute bronchitis, unspecified: Secondary | ICD-10-CM | POA: Insufficient documentation

## 2013-08-28 DIAGNOSIS — E079 Disorder of thyroid, unspecified: Secondary | ICD-10-CM | POA: Insufficient documentation

## 2013-08-28 DIAGNOSIS — J40 Bronchitis, not specified as acute or chronic: Secondary | ICD-10-CM

## 2013-08-28 DIAGNOSIS — R059 Cough, unspecified: Secondary | ICD-10-CM

## 2013-08-28 DIAGNOSIS — Z8669 Personal history of other diseases of the nervous system and sense organs: Secondary | ICD-10-CM | POA: Insufficient documentation

## 2013-08-28 DIAGNOSIS — Z794 Long term (current) use of insulin: Secondary | ICD-10-CM | POA: Insufficient documentation

## 2013-08-28 DIAGNOSIS — R05 Cough: Secondary | ICD-10-CM

## 2013-08-28 DIAGNOSIS — E785 Hyperlipidemia, unspecified: Secondary | ICD-10-CM | POA: Insufficient documentation

## 2013-08-28 LAB — URINALYSIS, ROUTINE W REFLEX MICROSCOPIC
BILIRUBIN URINE: NEGATIVE
Glucose, UA: NEGATIVE mg/dL
Hgb urine dipstick: NEGATIVE
KETONES UR: 15 mg/dL — AB
Leukocytes, UA: NEGATIVE
Nitrite: NEGATIVE
PROTEIN: NEGATIVE mg/dL
Specific Gravity, Urine: 1.01 (ref 1.005–1.030)
UROBILINOGEN UA: 0.2 mg/dL (ref 0.0–1.0)
pH: 6 (ref 5.0–8.0)

## 2013-08-28 LAB — CBC
HEMATOCRIT: 41.1 % (ref 36.0–46.0)
HEMOGLOBIN: 14.6 g/dL (ref 12.0–15.0)
MCH: 32.2 pg (ref 26.0–34.0)
MCHC: 35.5 g/dL (ref 30.0–36.0)
MCV: 90.7 fL (ref 78.0–100.0)
Platelets: 239 10*3/uL (ref 150–400)
RBC: 4.53 MIL/uL (ref 3.87–5.11)
RDW: 12.1 % (ref 11.5–15.5)
WBC: 4.6 10*3/uL (ref 4.0–10.5)

## 2013-08-28 LAB — GLUCOSE, CAPILLARY
Glucose-Capillary: 128 mg/dL — ABNORMAL HIGH (ref 70–99)
Glucose-Capillary: 182 mg/dL — ABNORMAL HIGH (ref 70–99)

## 2013-08-28 LAB — POCT INFLUENZA A/B
Influenza A, POC: NEGATIVE
Influenza B, POC: NEGATIVE

## 2013-08-28 LAB — BASIC METABOLIC PANEL
BUN: 10 mg/dL (ref 6–23)
CO2: 31 meq/L (ref 19–32)
CREATININE: 0.79 mg/dL (ref 0.50–1.10)
Calcium: 9.4 mg/dL (ref 8.4–10.5)
Chloride: 98 mEq/L (ref 96–112)
GFR calc Af Amer: >90 mL/min (ref >90)
Glucose, Bld: 141 mg/dL — ABNORMAL HIGH (ref 70–99)
Potassium: 4 mEq/L (ref 3.7–5.3)
SODIUM: 141 meq/L (ref 137–147)

## 2013-08-28 LAB — TROPONIN I

## 2013-08-28 MED ORDER — AMOXICILLIN 875 MG PO TABS: 875.0000 mg | ORAL_TABLET | Freq: Two times a day (BID) | ORAL | Status: DC

## 2013-08-28 MED ORDER — LEVOFLOXACIN 500 MG PO TABS: ORAL_TABLET | ORAL | Status: DC

## 2013-08-28 MED ORDER — GUAIFENESIN-DM 100-10 MG/5ML PO SYRP
5.0000 mL | ORAL_SOLUTION | Freq: Once | ORAL | Status: AC
  Administered 2013-08-28: 5 mL via ORAL
  Filled 2013-08-28: qty 5

## 2013-08-28 MED ORDER — SODIUM CHLORIDE 0.9 % IV BOLUS (SEPSIS)
1000.0000 mL | Freq: Once | INTRAVENOUS | Status: AC
  Administered 2013-08-28: 1000 mL via INTRAVENOUS

## 2013-08-28 MED ORDER — ALBUTEROL SULFATE (2.5 MG/3ML) 0.083% IN NEBU: 2.5000 mg | INHALATION_SOLUTION | Freq: Four times a day (QID) | RESPIRATORY_TRACT | Status: DC | PRN

## 2013-08-28 MED ORDER — LEVALBUTEROL HCL 1.25 MG/0.5ML IN NEBU: 1.2500 mg | INHALATION_SOLUTION | Freq: Once | RESPIRATORY_TRACT | Status: DC

## 2013-08-28 MED ORDER — HYDROCOD POLST-CHLORPHEN POLST 10-8 MG/5ML PO LQCR: ORAL | Status: DC

## 2013-08-28 NOTE — ED Notes (Signed)
MD in to speak with patient and her spouse- will discharge

## 2013-08-28 NOTE — Discharge Instructions (Signed)
Follow up if not improving.  Do not fill the amoxil,  You have been given a different prescription

## 2013-08-28 NOTE — ED Notes (Addendum)
Pt states cold symptoms began over the weekend, then became very SOB over the weekend. PT states CBG's have also been running high. Current CBG of 128. Pt states, "I was sent over here by [facility] so I could be admitted" NAD at this time. Pt also states dizziness. Pt is not orthostatic on arrival. Pt also states chest discomfort.

## 2013-08-28 NOTE — ED Notes (Signed)
Ambulatory to bathroom, pt c/o feeling weak.  Continues to have constant dry cough

## 2013-08-28 NOTE — ED Provider Notes (Signed)
CSN: [id number]     Arrival date & time 08/28/13  1621 History   First MD Initiated Contact with Patient 08/28/13 1850     Chief Complaint  Patient presents with  . Shortness of Breath  . Hyperglycemia   (Consider location/radiation/quality/duration/timing/severity/associated sxs/prior Treatment) Patient is a 53 y.o. female presenting with shortness of breath and hyperglycemia. The history is provided by the patient (the pt complains of  a cough and fatique).  Shortness of Breath Severity:  Moderate Onset quality:  Sudden Timing:  Intermittent Progression:  Waxing and waning Chronicity:  New Context: activity   Relieved by:  Nothing Associated symptoms: no abdominal pain, no chest pain, no cough, no headaches and no rash   Hyperglycemia Associated symptoms: shortness of breath   Associated symptoms: no abdominal pain, no chest pain and no fatigue     Past Medical History  Diagnosis Date  . Diabetes mellitus without complication   . Thyroid disease   . Hyperlipidemia   . Vasomotor phenomenon    Past Surgical History  Procedure Laterality Date  . Appendectomy    . Tubal ligation     Family History  Problem Relation Age of Onset  . Hypertension Mother   . Diabetes Mother   . Hypertension Father   . Healthy Brother   . Healthy Brother   . Healthy Brother    History  Substance Use Topics  . Smoking status: Never Smoker   . Smokeless tobacco: Not on file  . Alcohol Use: No   OB History   Grav Para Term Preterm Abortions TAB SAB Ect Mult Living                 Review of Systems  Constitutional: Negative for appetite change and fatigue.  HENT: Negative for congestion, ear discharge and sinus pressure.   Eyes: Negative for discharge.  Respiratory: Positive for shortness of breath. Negative for cough.   Cardiovascular: Negative for chest pain.  Gastrointestinal: Negative for abdominal pain and diarrhea.  Genitourinary: Negative for frequency and hematuria.   Musculoskeletal: Negative for back pain.  Skin: Negative for rash.  Neurological: Negative for seizures and headaches.  Psychiatric/Behavioral: Negative for hallucinations.    Allergies  Ace inhibitors; Codeine; and Lipitor  Home Medications   Current Outpatient Rx  Name  Route  Sig  Dispense  Refill  . albuterol (PROVENTIL) (2.5 MG/3ML) 0.083% nebulizer solution   Nebulization   Take 3 mLs (2.5 mg total) by nebulization every 6 (six) hours as needed for wheezing or shortness of breath.   150 mL   1   . aspirin 81 MG tablet   Oral   Take 81 mg by mouth daily.           . calcium citrate-vitamin D 200-200 MG-UNIT TABS   Oral   Take 1 tablet by mouth 2 (two) times daily.          . insulin glulisine (APIDRA) 100 UNIT/ML injection   Pump Prime   by Pump Prime route continuous.         Marland Kitchen levothyroxine (SYNTHROID, LEVOTHROID) 200 MCG tablet   Oral   Take 1 tablet (200 mcg total) by mouth daily before breakfast.   30 tablet   5   . meloxicam (MOBIC) 7.5 MG tablet   Oral   Take 1 tablet (7.5 mg total) by mouth daily.   30 tablet   5   . Multiple Vitamins-Minerals (MULTIVITAMIN,TX-MINERALS) tablet   Oral   Take  1 tablet by mouth daily.           . polyethylene glycol (MIRALAX / GLYCOLAX) packet   Oral   Take 17 g by mouth 3 (three) times daily.         . simvastatin (ZOCOR) 40 MG tablet   Oral   Take 1 tablet (40 mg total) by mouth every evening.   30 tablet   5   . amoxicillin (AMOXIL) 875 MG tablet   Oral   Take 1 tablet (875 mg total) by mouth 2 (two) times daily.   20 tablet   0   . chlorpheniramine-HYDROcodone (TUSSIONEX PENNKINETIC ER) 10-8 MG/5ML LQCR      Take one teaspoon every 12 hours for cougn   115 mL   0   . levofloxacin (LEVAQUIN) 500 MG tablet      Take one pill a day   7 tablet   0    BP 134/71  Pulse 81  Temp(Src) 98.5 F (36.9 C) (Oral)  Resp 20  Ht 5\' 6"  (1.676 m)  Wt 160 lb (72.576 kg)  BMI 25.84 kg/m2  SpO2  99% Physical Exam  Constitutional: She is oriented to person, place, and time. She appears well-developed.  HENT:  Head: Normocephalic.  Eyes: Conjunctivae and EOM are normal. No scleral icterus.  Neck: Neck supple. No thyromegaly present.  Cardiovascular: Normal rate and regular rhythm.  Exam reveals no gallop and no friction rub.   No murmur heard. Pulmonary/Chest: No stridor. She has no wheezes. She has no rales. She exhibits no tenderness.  Abdominal: She exhibits no distension. There is no tenderness. There is no rebound.  Musculoskeletal: Normal range of motion. She exhibits no edema.  Lymphadenopathy:    She has no cervical adenopathy.  Neurological: She is oriented to person, place, and time. She exhibits normal muscle tone. Coordination normal.  Skin: No rash noted. No erythema.  Psychiatric: She has a normal mood and affect. Her behavior is normal.    ED Course  Procedures (including critical care time) Labs Review Labs Reviewed  BASIC METABOLIC PANEL - Abnormal; Notable for the following:    Glucose, Bld 141 (*)    All other components within normal limits  URINALYSIS, ROUTINE W REFLEX MICROSCOPIC - Abnormal; Notable for the following:    Ketones, ur 15 (*)    All other components within normal limits  GLUCOSE, CAPILLARY - Abnormal; Notable for the following:    Glucose-Capillary 128 (*)    All other components within normal limits  GLUCOSE, CAPILLARY - Abnormal; Notable for the following:    Glucose-Capillary 182 (*)    All other components within normal limits  CBC  TROPONIN I   Imaging Review Dg Chest 2 View  08/28/2013   CLINICAL DATA:  Cough. Shortness of breath. . Labile blood sugar levels. Diabetes.  EXAM: CHEST  2 VIEW  COMPARISON:  CT ABD-PELV W/O CM dated 10/22/2007; DG CHEST 1V PORT dated 08/28/2011  FINDINGS: Upper thoracic spondylosis. The lungs appear clear. Cardiac and mediastinal contours normal. No pleural effusion identified.  IMPRESSION: 1. No acute  thoracic findings.  Mild upper thoracic spondylosis.   Electronically Signed   By: Sherryl Barters M.D.   On: 08/28/2013 17:21    EKG Interpretation    Date/Time:  Monday August 28 2013 17:01:30 EST Ventricular Rate:  85 PR Interval:  118 QRS Duration: 82 QT Interval:  364 QTC Calculation: 433 R Axis:   63 Text Interpretation:  Normal sinus  rhythm Normal ECG No previous ECGs available Confirmed by Laurie Lovejoy  MD, Daeshawn Redmann ([id number]) on 08/28/2013 9:57:59 PM            MDM   1. Bronchitis       Maudry Diego, MD 08/28/13 2158

## 2013-08-28 NOTE — Addendum Note (Signed)
Addended by: Pollyann Kennedy F on: 08/28/2013 04:11 PM   Modules accepted: Orders

## 2013-08-28 NOTE — ED Notes (Signed)
Constant dry hacking cough, patient states she feels worse now than when she arrived.  Also, c/o back pain, body aches

## 2013-08-28 NOTE — Patient Instructions (Signed)

## 2013-08-28 NOTE — Progress Notes (Signed)
   Subjective:    Patient ID: Alejandra Mcdonald, female    DOB: 28-Dec-1960, 53 y.o.   MRN: [id number]  HPI This 54 y.o. female presents for evaluation of persistent cough and uri sx's.  She is a  typer 1 diabetic and is on an insulin pump.  Her fsbs have been elevated up to 500 yesterday And she has adjusted her meds and now are running in the 200's.  She is short of breath And is coughing alot.  She has hx of asthma as child.  She states she has DDD of the Lumbar spine and has an appointment to see a back doctor.   Review of Systems C/o uri and cough and SOB No chest pain, HA, dizziness, vision change, N/V, diarrhea, constipation, dysuria, urinary urgency or frequency, myalgias, arthralgias or rash.     Objective:   Physical Exam Vital signs noted  Well developed well nourished female.  HEENT - Head atraumatic Normocephalic                Eyes - PERRLA, Conjuctiva - clear Sclera- Clear EOMI                Ears - EAC's Wnl TM's Wnl Gross Hearing WNL                Throat - oropharanx wnl Respiratory - Lungs diminished bilateral. Cardiac - RRR S1 and S2 without murmur GI - Abdomen soft Nontender and bowel sounds active x 4 Extremities - No edema. Neuro - Grossly intact.   Post neb tx - Patient c/o orthostasis and has to be lied in recumbant position.  She is taken to get CXR and cannot stand up and feels faint and states she will faint if she stands up.    Assessment & Plan:  Bronchitis - Plan: albuterol (PROVENTIL) (2.5 MG/3ML) 0.083% nebulizer solution, DME Nebulizer machine, amoxicillin (AMOXIL) 875 MG tablet  Cough - Plan: She Cannot tolerate standing up to get cxr in office.  She is dyspniec.  She has resp rate Of 30 and her oxygen saturation is 95% on room air.  She is weak and feels terrible.  I discuss with her That she needs to go to the ED since she is so SOB and feels so bad.  Her husband is called and he Will take her [facility] the ED at [facility].  She does not want to go  via ambulance.  I recommend she be  Evaluated in the ED first  Lysbeth Penner FNP

## 2013-10-25 ENCOUNTER — Telehealth: Payer: Self-pay | Admitting: Nurse Practitioner

## 2013-10-25 MED ORDER — INSULIN ASPART 100 UNIT/ML ~~LOC~~ SOLN: SUBCUTANEOUS | Status: DC

## 2013-10-25 NOTE — Telephone Encounter (Signed)
rx sent to pharmacy

## 2013-12-06 ENCOUNTER — Telehealth: Payer: Self-pay | Admitting: Nurse Practitioner

## 2013-12-06 MED ORDER — LINACLOTIDE 290 MCG PO CAPS: 290.0000 ug | ORAL_CAPSULE | Freq: Every day | ORAL | Status: DC

## 2013-12-06 NOTE — Telephone Encounter (Signed)
rx sent to pharmacy

## 2013-12-08 ENCOUNTER — Other Ambulatory Visit: Payer: Self-pay | Admitting: *Deleted

## 2013-12-08 DIAGNOSIS — E039 Hypothyroidism, unspecified: Secondary | ICD-10-CM

## 2013-12-08 MED ORDER — LEVOTHYROXINE SODIUM 200 MCG PO TABS: 200.0000 ug | ORAL_TABLET | Freq: Every day | ORAL | Status: DC

## 2013-12-25 ENCOUNTER — Encounter: Payer: Self-pay | Admitting: Nurse Practitioner

## 2013-12-25 ENCOUNTER — Ambulatory Visit (INDEPENDENT_AMBULATORY_CARE_PROVIDER_SITE_OTHER): Payer: BC Managed Care – PPO

## 2013-12-25 ENCOUNTER — Ambulatory Visit (INDEPENDENT_AMBULATORY_CARE_PROVIDER_SITE_OTHER): Payer: BC Managed Care – PPO | Admitting: Nurse Practitioner

## 2013-12-25 ENCOUNTER — Telehealth: Payer: Self-pay | Admitting: Nurse Practitioner

## 2013-12-25 VITALS — BP 127/68 | Temp 99.1°F | Wt 159.0 lb

## 2013-12-25 DIAGNOSIS — K649 Unspecified hemorrhoids: Secondary | ICD-10-CM

## 2013-12-25 DIAGNOSIS — K5900 Constipation, unspecified: Secondary | ICD-10-CM

## 2013-12-25 MED ORDER — HYDROCORTISONE ACETATE 25 MG RE SUPP: 25.0000 mg | Freq: Two times a day (BID) | RECTAL | Status: DC

## 2013-12-25 MED ORDER — LUBIPROSTONE 24 MCG PO CAPS: 24.0000 ug | ORAL_CAPSULE | Freq: Two times a day (BID) | ORAL | Status: DC

## 2013-12-25 NOTE — Patient Instructions (Signed)
Hemorrhoids Hemorrhoids are swollen veins around the rectum or anus. There are two types of hemorrhoids:   Internal hemorrhoids. These occur in the veins just inside the rectum. They may poke through to the outside and become irritated and painful.  External hemorrhoids. These occur in the veins outside the anus and can be felt as a painful swelling or hard lump near the anus. CAUSES  Pregnancy.   Obesity.   Constipation or diarrhea.   Straining to have a bowel movement.   Sitting for long periods on the toilet.  Heavy lifting or other activity that caused you to strain.  Anal intercourse. SYMPTOMS   Pain.   Anal itching or irritation.   Rectal bleeding.   Fecal leakage.   Anal swelling.   One or more lumps around the anus.  DIAGNOSIS  Your caregiver may be able to diagnose hemorrhoids by visual examination. Other examinations or tests that may be performed include:   Examination of the rectal area with a gloved hand (digital rectal exam).   Examination of anal canal using a small tube (scope).   A blood test if you have lost a significant amount of blood.  A test to look inside the colon (sigmoidoscopy or colonoscopy). TREATMENT Most hemorrhoids can be treated at home. However, if symptoms do not seem to be getting better or if you have a lot of rectal bleeding, your caregiver may perform a procedure to help make the hemorrhoids get smaller or remove them completely. Possible treatments include:   Placing a rubber band at the base of the hemorrhoid to cut off the circulation (rubber band ligation).   Injecting a chemical to shrink the hemorrhoid (sclerotherapy).   Using a tool to burn the hemorrhoid (infrared light therapy).   Surgically removing the hemorrhoid (hemorrhoidectomy).   Stapling the hemorrhoid to block blood flow to the tissue (hemorrhoid stapling).  HOME CARE INSTRUCTIONS   Eat foods with fiber, such as whole grains, beans,  nuts, fruits, and vegetables. Ask your doctor about taking products with added fiber in them (fibersupplements).  Increase fluid intake. Drink enough water and fluids to keep your urine clear or pale yellow.   Exercise regularly.   Go to the bathroom when you have the urge to have a bowel movement. Do not wait.   Avoid straining to have bowel movements.   Keep the anal area dry and clean. Use wet toilet paper or moist towelettes after a bowel movement.   Medicated creams and suppositories may be used or applied as directed.   Only take over-the-counter or prescription medicines as directed by your caregiver.   Take warm sitz baths for 15 20 minutes, 3 4 times a day to ease pain and discomfort.   Place ice packs on the hemorrhoids if they are tender and swollen. Using ice packs between sitz baths may be helpful.   Put ice in a plastic bag.   Place a towel between your skin and the bag.   Leave the ice on for 15 20 minutes, 3 4 times a day.   Do not use a donut-shaped pillow or sit on the toilet for long periods. This increases blood pooling and pain.  SEEK MEDICAL CARE IF:  You have increasing pain and swelling that is not controlled by treatment or medicine.  You have uncontrolled bleeding.  You have difficulty or you are unable to have a bowel movement.  You have pain or inflammation outside the area of the hemorrhoids. MAKE SURE YOU:    Understand these instructions.  Will watch your condition.  Will get help right away if you are not doing well or get worse. Document Released: 07/31/2000 Document Revised: 07/20/2012 Document Reviewed: 06/07/2012 [employer] Patient Information 2014 [employer].  

## 2013-12-25 NOTE — Telephone Encounter (Signed)
appt given for today at 2

## 2013-12-25 NOTE — Progress Notes (Signed)
   Subjective:    Patient ID: Alejandra Mcdonald, female    DOB: [birth year], 54 y.o.   MRN: [id number]  HPI Patient in c/o hemorrhoids that are starting to bleed- She has a long history of constipation currently on linzess which helps a little. SHe always strains to go to the restroom.    Review of Systems  Constitutional: Negative.   Respiratory: Negative.   Cardiovascular: Negative.   Gastrointestinal: Positive for constipation.  Psychiatric/Behavioral: Negative.   All other systems reviewed and are negative.      Objective:   Physical Exam  Constitutional: She is oriented to person, place, and time. She appears well-developed and well-nourished.  Cardiovascular: Normal rate, regular rhythm and normal heart sounds.   Pulmonary/Chest: Effort normal and breath sounds normal.  Abdominal: Soft. Bowel sounds are normal. There is tenderness.  Diminished breaths sounds  Genitourinary:  External mildly thrombosed hemorrhoids  Neurological: She is alert and oriented to person, place, and time.  Skin: Skin is warm and dry.  Psychiatric: She has a normal mood and affect. Her behavior is normal. Judgment and thought content normal.   BP 127/68  Temp(Src) 99.1 F (37.3 C) (Oral)  Wt 159 lb (72.122 kg)        Assessment & Plan:   1. Constipation   2. Hemorrhoids    Meds ordered this encounter  Medications  . hydrocortisone (ANUSOL-HC) 25 MG suppository    Sig: Place 1 suppository (25 mg total) rectally 2 (two) times daily.    Dispense:  12 suppository    Refill:  0    Order Specific Question:  Supervising Provider    Answer:  Chipper Herb [[id number]]  . lubiprostone (AMITIZA) 24 MCG capsule    Sig: Take 1 capsule (24 mcg total) by mouth 2 (two) times daily with a meal.    Dispense:  60 capsule    Refill:  2    Order Specific Question:  Supervising Provider    Answer:  Joycelyn Man   Discussed use of comfrey tea bath for hemorrhoids Increase fiber in diet Added  amitza and d/c'd linzess Follow up prn Patient to make appointment for CPE, PAP and diabetes check up. Mary-Margaret Hassell Done, FNP

## 2014-01-04 ENCOUNTER — Telehealth: Payer: Self-pay | Admitting: Nurse Practitioner

## 2014-01-04 ENCOUNTER — Encounter: Payer: Self-pay | Admitting: Family Medicine

## 2014-01-04 ENCOUNTER — Ambulatory Visit (INDEPENDENT_AMBULATORY_CARE_PROVIDER_SITE_OTHER): Payer: BC Managed Care – PPO | Admitting: Family Medicine

## 2014-01-04 VITALS — BP 122/67 | HR 73 | Temp 98.5°F | Ht 66.0 in | Wt 157.6 lb

## 2014-01-04 DIAGNOSIS — R21 Rash and other nonspecific skin eruption: Secondary | ICD-10-CM

## 2014-01-04 MED ORDER — METHYLPREDNISOLONE (PAK) 4 MG PO TABS: ORAL_TABLET | ORAL | Status: DC

## 2014-01-04 MED ORDER — HYDROXYZINE HCL 25 MG PO TABS: 25.0000 mg | ORAL_TABLET | Freq: Three times a day (TID) | ORAL | Status: DC | PRN

## 2014-01-04 NOTE — Telephone Encounter (Signed)
Patient will call back around 5

## 2014-01-04 NOTE — Progress Notes (Signed)
   Subjective:    Patient ID: Alejandra Mcdonald, female    DOB: [birth year], 53 y.o.   MRN: [id number]  HPI  This 52 y.o. female presents for evaluation of rash on chest and face.  She is not aware of any  Irritants nor has she been taking any new medications.  Review of Systems C/o rash   No chest pain, SOB, HA, dizziness, vision change, N/V, diarrhea, constipation, dysuria, urinary urgency or frequency, myalgias, arthralgias.  Objective:   Physical Exam   Vital signs noted  Well developed well nourished female.  HEENT - Head atraumatic Normocephalic                Eyes - PERRLA, Conjuctiva - clear Sclera- Clear EOMI                Ears - EAC's Wnl TM's Wnl Gross Hearing WNL                Throat - oropharanx wnl Respiratory - Lungs CTA bilateral Cardiac - RRR S1 and S2 without murmur GI - Abdomen soft Nontender and bowel sounds active x 4 Skin - Rash on face and chest     Assessment & Plan:  Rash and nonspecific skin eruption - Plan: methylPREDNIsolone (MEDROL DOSPACK) 4 MG tablet, hydrOXYzine (ATARAX/VISTARIL) 25 MG tablet  Lysbeth Penner FNP

## 2014-01-31 ENCOUNTER — Ambulatory Visit: Payer: BC Managed Care – PPO | Admitting: Nurse Practitioner

## 2014-02-02 ENCOUNTER — Telehealth: Payer: Self-pay | Admitting: Nurse Practitioner

## 2014-02-14 NOTE — Telephone Encounter (Signed)
Will need to go to PCP to sign - I don't regularly see this patient.  I will be glad to assist PCP with filling out paper work. Needs A1c.

## 2014-02-15 NOTE — Telephone Encounter (Signed)
i have nit gotten anything about a pump on her .

## 2014-02-15 NOTE — Telephone Encounter (Signed)
Patient aware.

## 2014-02-15 NOTE — Telephone Encounter (Signed)
Please call patient and ell her i have not gotten rquest fr new pm- Needs hgba1c

## 2014-02-27 ENCOUNTER — Encounter: Payer: Self-pay | Admitting: Nurse Practitioner

## 2014-02-27 ENCOUNTER — Ambulatory Visit (INDEPENDENT_AMBULATORY_CARE_PROVIDER_SITE_OTHER): Payer: BC Managed Care – PPO | Admitting: Nurse Practitioner

## 2014-02-27 ENCOUNTER — Ambulatory Visit (INDEPENDENT_AMBULATORY_CARE_PROVIDER_SITE_OTHER): Payer: BC Managed Care – PPO

## 2014-02-27 VITALS — BP 126/72 | HR 78 | Temp 97.6°F | Ht 66.0 in | Wt 163.0 lb

## 2014-02-27 DIAGNOSIS — Z0000 Encounter for general adult medical examination without abnormal findings: Secondary | ICD-10-CM

## 2014-02-27 DIAGNOSIS — E109 Type 1 diabetes mellitus without complications: Secondary | ICD-10-CM

## 2014-02-27 DIAGNOSIS — M25519 Pain in unspecified shoulder: Secondary | ICD-10-CM

## 2014-02-27 DIAGNOSIS — E039 Hypothyroidism, unspecified: Secondary | ICD-10-CM

## 2014-02-27 DIAGNOSIS — E785 Hyperlipidemia, unspecified: Secondary | ICD-10-CM

## 2014-02-27 DIAGNOSIS — M25511 Pain in right shoulder: Secondary | ICD-10-CM

## 2014-02-27 DIAGNOSIS — Z01419 Encounter for gynecological examination (general) (routine) without abnormal findings: Secondary | ICD-10-CM

## 2014-02-27 DIAGNOSIS — K5900 Constipation, unspecified: Secondary | ICD-10-CM

## 2014-02-27 LAB — POCT URINALYSIS DIPSTICK
BILIRUBIN UA: NEGATIVE
Blood, UA: NEGATIVE
Glucose, UA: NEGATIVE
KETONES UA: NEGATIVE
Nitrite, UA: NEGATIVE
PROTEIN UA: NEGATIVE
SPEC GRAV UA: 1.01
Urobilinogen, UA: NEGATIVE
pH, UA: 7.5

## 2014-02-27 LAB — POCT UA - MICROALBUMIN: Microalbumin Ur, POC: NEGATIVE mg/L

## 2014-02-27 LAB — POCT UA - MICROSCOPIC ONLY
Bacteria, U Microscopic: NEGATIVE
Casts, Ur, LPF, POC: NEGATIVE
Crystals, Ur, HPF, POC: NEGATIVE
MUCUS UA: NEGATIVE
RBC, URINE, MICROSCOPIC: NEGATIVE
Yeast, UA: NEGATIVE

## 2014-02-27 LAB — POCT GLYCOSYLATED HEMOGLOBIN (HGB A1C): HEMOGLOBIN A1C: 7.7

## 2014-02-27 MED ORDER — MELOXICAM 15 MG PO TABS: 15.0000 mg | ORAL_TABLET | Freq: Every day | ORAL | Status: DC

## 2014-02-27 MED ORDER — SIMVASTATIN 40 MG PO TABS: 40.0000 mg | ORAL_TABLET | Freq: Every evening | ORAL | Status: DC

## 2014-02-27 MED ORDER — POLYETHYLENE GLYCOL 3350 17 G PO PACK: 17.0000 g | PACK | Freq: Three times a day (TID) | ORAL | Status: DC

## 2014-02-27 NOTE — Progress Notes (Signed)
Subjective:    Patient ID: Alejandra Mcdonald, female    DOB: 06-20-1961, 53 y.o.   MRN: [id number]  Patient is here today for annual physical with pap. She is complaining of right shoulder pain- rates pain 7-9/10- hurts all the time- movement eases it some- worse at night. Diabetes She presents for her follow-up diabetic visit. She has type 1 diabetes mellitus. No MedicAlert identification noted. The initial diagnosis of diabetes was made 33 years ago. Her disease course has been fluctuating. There are no hypoglycemic associated symptoms. Pertinent negatives for hypoglycemia include no confusion, dizziness, nervousness/anxiousness or tremors. Pertinent negatives for diabetes include no blurred vision, no chest pain, no fatigue, no foot ulcerations, no polydipsia, no polyphagia, no weakness and no weight loss. There are no hypoglycemic complications. Symptoms are stable. There are no diabetic complications. Risk factors for coronary artery disease include dyslipidemia. Current diabetic treatment includes insulin pump. She is compliant with treatment all of the time. Her weight is stable. She is following a generally healthy diet. When asked about meal planning, she reported none. She has not had a previous visit with a dietician. She participates in exercise daily. She monitors blood glucose at home 5+ x per day. Her home blood glucose trend is fluctuating dramatically. Her breakfast blood glucose is taken between 8-9 am. Her breakfast blood glucose range is generally 110-130 mg/dl. Her lunch blood glucose is taken between 1-2 pm. Her lunch blood glucose range is generally 130-140 mg/dl. Her dinner blood glucose is taken between 7-8 pm. Her dinner blood glucose range is generally 130-140 mg/dl. Her highest blood glucose is >200 mg/dl. Her overall blood glucose range is 130-140 mg/dl. She does not see a podiatrist.Eye exam is current.  Hyperlipidemia This is a chronic problem. The current episode started more than  1 year ago. The problem is uncontrolled. Exacerbating diseases include diabetes and hypothyroidism. Pertinent negatives include no chest pain, leg pain, myalgias or shortness of breath. Current antihyperlipidemic treatment includes statins. There are no compliance problems.  Risk factors for coronary artery disease include diabetes mellitus.  Hypothyroidism This is a chronic problem. Currently on Zocor. Taking daily as Rx. Exercises 5x/week. Walks 10,000 steps a day. Weight watchers diet Lumbar Pars defect Recently diagnosed and is going to see specialist in  About 2 weeks. Constipation Has tried linzess- miralax is the only thing that has helped her   Review of Systems  Constitutional: Negative for weight loss and fatigue.  Eyes: Negative for blurred vision.  Respiratory: Positive for cough (Nonproductive). Negative for shortness of breath.   Cardiovascular: Negative for chest pain.  Endocrine: Negative for polydipsia and polyphagia.  Musculoskeletal: Negative for myalgias.  Neurological: Negative for dizziness, tremors and weakness.  Psychiatric/Behavioral: Negative for confusion. The patient is not nervous/anxious.   All other systems reviewed and are negative.      Objective:   Physical Exam  Constitutional: She is oriented to person, place, and time. She appears well-developed and well-nourished.  HENT:  Head: Normocephalic.  Right Ear: External ear normal.  Left Ear: External ear normal.  Nose: Nose normal.  Mouth/Throat: Oropharynx is clear and moist.  Eyes: Conjunctivae and EOM are normal. Pupils are equal, round, and reactive to light.  Neck: Normal range of motion. Neck supple.  Cardiovascular: Normal rate, regular rhythm, normal heart sounds and intact distal pulses.   Pulmonary/Chest: Effort normal and breath sounds normal.  Abdominal: Soft. Bowel sounds are normal.  Musculoskeletal: Normal range of motion.  Neurological: She is alert  and oriented to person, place, and  time.  Skin: Skin is warm and dry.  Psychiatric: She has a normal mood and affect. Her behavior is normal. Judgment and thought content normal.     BP 126/72  Pulse 78  Temp(Src) 97.6 F (36.4 C) (Oral)  Ht 5' 6"  (1.676 m)  Wt 163 lb (73.936 kg)  BMI 26.32 kg/m2 Results for orders placed in visit on 02/27/14  POCT GLYCOSYLATED HEMOGLOBIN (HGB A1C)      Result Value Ref Range   Hemoglobin A1C 7.7    POCT UA - MICROSCOPIC ONLY      Result Value Ref Range   WBC, Ur, HPF, POC 5-10     RBC, urine, microscopic neg     Bacteria, U Microscopic neg     Mucus, UA neg     Epithelial cells, urine per micros occ     Crystals, Ur, HPF, POC neg     Casts, Ur, LPF, POC neg     Yeast, UA neg    POCT URINALYSIS DIPSTICK      Result Value Ref Range   Color, UA yellow     Clarity, UA clear     Glucose, UA neg     Bilirubin, UA neg     Ketones, UA neg     Spec Grav, UA 1.010     Blood, UA neg     pH, UA 7.5     Protein, UA neg     Urobilinogen, UA negative     Nitrite, UA neg     Leukocytes, UA Trace    POCT UA - MICROALBUMIN      Result Value Ref Range   Microalbumin Ur, POC neg       Right shoulder x ray- normal-Preliminary reading by Ronnald Collum, FNP  [facility]     Assessment & Plan:   1. Other and unspecified hyperlipidemia   2. Encounter for routine gynecological examination   3. Annual physical exam   4. Type 1 diabetes mellitus with target hemoglobin A1c of less than 7.5 percent   5. Unspecified hypothyroidism   6. Constipation, unspecified constipation type   7. Right shoulder pain    Orders Placed This Encounter  Procedures  . DG Shoulder Right    Standing Status: Future     Number of Occurrences: 1     Standing Expiration Date: 04/29/2015    Order Specific Question:  Reason for Exam (SYMPTOM  OR DIAGNOSIS REQUIRED)    Answer:  right  shoulder pain    Order Specific Question:  Is the patient pregnant?    Answer:  No    Order Specific Question:  Preferred imaging  location?    Answer:  Internal  . CMP14+EGFR  . NMR, lipoprofile  . C-peptide  . POCT glycosylated hemoglobin (Hb A1C)  . POCT UA - Microscopic Only  . POCT urinalysis dipstick  . POCT UA - Microalbumin   Meds ordered this encounter  Medications  . meloxicam (MOBIC) 15 MG tablet    Sig: Take 1 tablet (15 mg total) by mouth daily.    Dispense:  30 tablet    Refill:  3    Order Specific Question:  Supervising Provider    Answer:  Chipper Herb [[id number]]  . simvastatin (ZOCOR) 40 MG tablet    Sig: Take 1 tablet (40 mg total) by mouth every evening.    Dispense:  30 tablet    Refill:  5  Order Specific Question:  Supervising Provider    Answer:  Chipper Herb [[id number]]  . polyethylene glycol (MIRALAX / GLYCOLAX) packet    Sig: Take 17 g by mouth 3 (three) times daily.    Dispense:  14 each    Refill:  11    Order Specific Question:  Supervising Provider    Answer:  Chipper Herb [[id number]]    Increased dose of mobic to 39m- mosit heat to shoulder- if no bettter needs MRI or ortho referral Labs pending Health maintenance reviewed Diet and exercise encouraged Continue all meds Follow up  In 3 months   MHardy FNP

## 2014-02-27 NOTE — Patient Instructions (Signed)

## 2014-02-28 LAB — CMP14+EGFR
A/G RATIO: 2.4 (ref 1.1–2.5)
ALBUMIN: 4.3 g/dL (ref 3.5–5.5)
ALT: 27 IU/L (ref 0–32)
AST: 22 IU/L (ref 0–40)
Alkaline Phosphatase: 69 IU/L (ref 39–117)
BUN/Creatinine Ratio: 14 (ref 9–23)
BUN: 12 mg/dL (ref 6–24)
CO2: 27 mmol/L (ref 18–29)
Calcium: 9.7 mg/dL (ref 8.7–10.2)
Chloride: 96 mmol/L — ABNORMAL LOW (ref 97–108)
Creatinine, Ser: 0.83 mg/dL (ref 0.57–1.00)
GFR, EST AFRICAN AMERICAN: 92 mL/min/{1.73_m2} (ref >59)
GFR, EST NON AFRICAN AMERICAN: 80 mL/min/{1.73_m2} (ref >59)
GLUCOSE: 158 mg/dL — AB (ref 65–99)
Globulin, Total: 1.8 g/dL (ref 1.5–4.5)
Potassium: 4.7 mmol/L (ref 3.5–5.2)
Sodium: 138 mmol/L (ref 134–144)
TOTAL PROTEIN: 6.1 g/dL (ref 6.0–8.5)
Total Bilirubin: 0.6 mg/dL (ref 0.0–1.2)

## 2014-02-28 LAB — NMR, LIPOPROFILE
CHOLESTEROL: 242 mg/dL — AB (ref 100–199)
HDL Cholesterol by NMR: 99 mg/dL (ref >39)
HDL Particle Number: 41.8 umol/L (ref >=30.5)
LDL PARTICLE NUMBER: 1185 nmol/L — AB (ref <1000)
LDL Size: 21.4 nm (ref >20.5)
LDLC SERPL CALC-MCNC: 130 mg/dL — ABNORMAL HIGH (ref 0–99)
LP-IR Score: <25 (ref <=45)
Small LDL Particle Number: <90 nmol/L (ref <=527)
TRIGLYCERIDES BY NMR: 67 mg/dL (ref 0–149)

## 2014-02-28 LAB — PAP IG W/ RFLX HPV ASCU: PAP Smear Comment: 0

## 2014-02-28 LAB — C-PEPTIDE: C-Peptide: <0.1 ng/mL — ABNORMAL LOW (ref 1.1–4.4)

## 2014-03-02 ENCOUNTER — Telehealth: Payer: Self-pay | Admitting: Family Medicine

## 2014-03-02 NOTE — Telephone Encounter (Signed)
Message copied by Waverly Ferrari on Fri Mar 02, 2014  9:19 AM ------      Message from: Chevis Pretty      Created: Fri Mar 02, 2014  8:56 AM       PAP normal- repeat in 2 years      microalbumin negative      Urine- clear      Kidney and liver function stable      LDL particle numbers look good- ldl somewhat elevated      c peptide normal ------

## 2014-04-09 ENCOUNTER — Other Ambulatory Visit: Payer: Self-pay | Admitting: Nurse Practitioner

## 2014-05-31 ENCOUNTER — Ambulatory Visit: Payer: BC Managed Care – PPO | Admitting: Nurse Practitioner

## 2014-07-04 ENCOUNTER — Other Ambulatory Visit: Payer: Self-pay | Admitting: Nurse Practitioner

## 2014-08-27 ENCOUNTER — Ambulatory Visit (INDEPENDENT_AMBULATORY_CARE_PROVIDER_SITE_OTHER): Payer: BLUE CROSS/BLUE SHIELD | Admitting: *Deleted

## 2014-08-27 ENCOUNTER — Ambulatory Visit (INDEPENDENT_AMBULATORY_CARE_PROVIDER_SITE_OTHER): Payer: BLUE CROSS/BLUE SHIELD | Admitting: Nurse Practitioner

## 2014-08-27 ENCOUNTER — Encounter: Payer: Self-pay | Admitting: Nurse Practitioner

## 2014-08-27 VITALS — BP 124/76 | HR 85 | Temp 97.1°F | Ht 66.0 in | Wt 165.0 lb

## 2014-08-27 DIAGNOSIS — E785 Hyperlipidemia, unspecified: Secondary | ICD-10-CM

## 2014-08-27 DIAGNOSIS — E039 Hypothyroidism, unspecified: Secondary | ICD-10-CM

## 2014-08-27 DIAGNOSIS — K5900 Constipation, unspecified: Secondary | ICD-10-CM

## 2014-08-27 DIAGNOSIS — E109 Type 1 diabetes mellitus without complications: Secondary | ICD-10-CM

## 2014-08-27 DIAGNOSIS — Z23 Encounter for immunization: Secondary | ICD-10-CM

## 2014-08-27 DIAGNOSIS — M4306 Spondylolysis, lumbar region: Secondary | ICD-10-CM

## 2014-08-27 LAB — POCT GLYCOSYLATED HEMOGLOBIN (HGB A1C): Hemoglobin A1C: 7.5

## 2014-08-27 MED ORDER — SIMVASTATIN 40 MG PO TABS: 40.0000 mg | ORAL_TABLET | Freq: Every evening | ORAL | Status: DC

## 2014-08-27 MED ORDER — POLYETHYLENE GLYCOL 3350 17 GM/SCOOP PO POWD: 1.0000 | Freq: Once | ORAL | Status: DC

## 2014-08-27 MED ORDER — LEVOTHYROXINE SODIUM 200 MCG PO TABS: ORAL_TABLET | ORAL | Status: DC

## 2014-08-27 MED ORDER — INSULIN ASPART 100 UNIT/ML ~~LOC~~ SOLN: SUBCUTANEOUS | Status: DC

## 2014-08-27 NOTE — Progress Notes (Signed)
Subjective:    Patient ID: Alejandra Mcdonald, female    DOB: [birth year], 54 y.o.   MRN: [id number]   Patient here today for follow up of chronic medical problems. She says that her blood sugar has been running really high- when she bolus with pump blood sugar will not come down but if she gives herself a shot it comes down. She thinks she needs a new pump.   Diabetes She presents for her follow-up diabetic visit. She has type 1 diabetes mellitus. MedicAlert identification noted. Her disease course has been fluctuating. Pertinent negatives for hypoglycemia include no confusion, dizziness, nervousness/anxiousness or tremors. Pertinent negatives for diabetes include no chest pain, no fatigue, no polydipsia, no polyphagia and no weakness. There are no hypoglycemic complications. There are no diabetic complications. Risk factors for coronary artery disease include diabetes mellitus, dyslipidemia, hypertension and post-menopausal. Current diabetic treatment includes insulin pump and insulin injections. She is compliant with treatment all of the time. Her weight is stable. When asked about meal planning, she reported none. She has not had a previous visit with a dietitian. She rarely participates in exercise. There is no change in her home blood glucose trend. Her breakfast blood glucose range is generally >200 mg/dl. Her lunch blood glucose range is generally >200 mg/dl. An ACE inhibitor/angiotensin II receptor blocker is not being taken. She does not see a podiatrist.Eye exam is not current.  Hyperlipidemia This is a chronic problem. The current episode started more than 1 year ago. The problem is controlled. Recent lipid tests were reviewed and are normal. Pertinent negatives include no chest pain, myalgias or shortness of breath. Current antihyperlipidemic treatment includes statins. Compliance problems include adherence to diet and adherence to exercise.  Risk factors for coronary artery disease include diabetes  mellitus and dyslipidemia.  Hypothyroidism This is a chronic problem. Currently on Zocor. Taking daily as Rx. Exercises 5x/week. Walks 10,000 steps a day. Weight watchers diet Lumbar Pars defect Recently diagnosed and is going to see specialist in  About 2 weeks. Constipation Has tried linzess- miralax is the only thing that has helped her   Review of Systems  Constitutional: Negative for fatigue.  Respiratory: Positive for cough (Nonproductive). Negative for shortness of breath.   Cardiovascular: Negative for chest pain.  Endocrine: Negative for polydipsia and polyphagia.  Musculoskeletal: Negative for myalgias.  Neurological: Negative for dizziness, tremors and weakness.  Psychiatric/Behavioral: Negative for confusion. The patient is not nervous/anxious.   All other systems reviewed and are negative.      Objective:   Physical Exam  Constitutional: She is oriented to person, place, and time. She appears well-developed and well-nourished.  HENT:  Nose: Nose normal.  Mouth/Throat: Oropharynx is clear and moist.  Eyes: EOM are normal.  Neck: Trachea normal, normal range of motion and full passive range of motion without pain. Neck supple. No JVD present. Carotid bruit is not present. No thyromegaly present.  Cardiovascular: Normal rate, regular rhythm, normal heart sounds and intact distal pulses.  Exam reveals no gallop and no friction rub.   No murmur heard. Pulmonary/Chest: Effort normal and breath sounds normal.  Abdominal: Soft. Bowel sounds are normal. She exhibits no distension and no mass. There is no tenderness.  Musculoskeletal: Normal range of motion.  Lymphadenopathy:    She has no cervical adenopathy.  Neurological: She is alert and oriented to person, place, and time. She has normal reflexes.  Skin: Skin is warm and dry.  Psychiatric: She has a normal mood and affect.  Her behavior is normal. Judgment and thought content normal.     BP 140/77 mmHg  Pulse 85   Temp(Src) 97.1 F (36.2 C) (Oral)  Ht 5' 6"  (1.676 m)  Wt 165 lb (74.844 kg)  BMI 26.64 kg/m2 Results for orders placed or performed in visit on 08/27/14  POCT glycosylated hemoglobin (Hb A1C)  Result Value Ref Range   Hemoglobin A1C 7.5%           Assessment & Plan:   1. Type 1 diabetes mellitus with target hemoglobin A1c of less than 7.5 percent Patient will call company about getting new pum and will let me know what she needs for me to do. - POCT glycosylated hemoglobin (Hb A1C) - CMP14+EGFR - NMR, lipoprofile - insulin aspart (NOVOLOG) 100 UNIT/ML injection; Per pump  Dispense: 3 vial; Refill: 11  2. Hypothyroidism, unspecified hypothyroidism type - levothyroxine (SYNTHROID, LEVOTHROID) 200 MCG tablet; TAKE 1 TABLET BY MOUTH EVERY DAY BEFORE BREAKFAST  Dispense: 30 tablet; Refill: 5  3. Lumbar pars defect  4. Hyperlipidemia with target LDL less than 100 Low fta diet - simvastatin (ZOCOR) 40 MG tablet; Take 1 tablet (40 mg total) by mouth every evening.  Dispense: 30 tablet; Refill: 5  5. Constipation, unspecified constipation type Continue miralax - polyethylene glycol powder (GLYCOLAX/MIRALAX) powder; Take 255 g by mouth once.  Dispense: 850 g; Refill: 2    Labs pending Health maintenance reviewed Diet and exercise encouraged Continue all meds Follow up  In 3 months   Maury, FNP

## 2014-08-27 NOTE — Patient Instructions (Signed)
Bone Health Our bones do many things. They provide structure, protect organs, anchor muscles, and store calcium. Adequate calcium in your diet and weight-bearing physical activity help build strong bones, improve bone amounts, and may reduce the risk of weakening of bones (osteoporosis) later in life. PEAK BONE MASS By age 55, the average woman has acquired most of her skeletal bone mass. A large decline occurs in older adults which increases the risk of osteoporosis. In women this occurs around the time of menopause. It is important for young girls to reach their peak bone mass in order to maintain bone health throughout life. A person with high bone mass as a young adult will be more likely to have a higher bone mass later in life. Not enough calcium consumption and physical activity early on could result in a failure to achieve optimum bone mass in adulthood. OSTEOPOROSIS Osteoporosis is a disease of the bones. It is defined as low bone mass with deterioration of bone structure. Osteoporosis leads to an increase risk of fractures with falls. These fractures commonly happen in the wrist, hip, and spine. While men and women of all ages and background can develop osteoporosis, some of the risk factors for osteoporosis are:  Female.  White.  Postmenopausal.  Older adults.  Small in body size.  Eating a diet low in calcium.  Physically inactive.  Smoking.  Use of some medications.  Family history. CALCIUM Calcium is a mineral needed by the body for healthy bones, teeth, and proper function of the heart, muscles, and nerves. The body cannot produce calcium so it must be absorbed through food. Good sources of calcium include:  Dairy products (low fat or nonfat milk, cheese, and yogurt).  Dark green leafy vegetables (bok choy and broccoli).  Calcium fortified foods (orange juice, cereal, bread, soy beverages, and tofu products).  Nuts (almonds). Recommended amounts of calcium vary  for individuals. RECOMMENDED CALCIUM INTAKES Age and Amount in mg per day  Children 1 to 3 years / 700 mg  Children 4 to 8 years / 1,000 mg  Children 9 to 13 years / 1,300 mg  Teens 14 to 18 years / 1,300 mg  Adults 19 to 50 years / 1,000 mg  Adult women 51 to 70 years / 1,200 mg  Adults 71 years and older / 1,200 mg  Pregnant and breastfeeding teens / 1,300 mg  Pregnant and breastfeeding adults / 1,000 mg Vitamin D also plays an important role in healthy bone development. Vitamin D helps in the absorption of calcium. WEIGHT-BEARING PHYSICAL ACTIVITY Regular physical activity has many positive health benefits. Benefits include strong bones. Weight-bearing physical activity early in life is important in reaching peak bone mass. Weight-bearing physical activities cause muscles and bones to work against gravity. Some examples of weight bearing physical activities include:  Walking, jogging, or running.  Field Hockey.  Jumping rope.  Dancing.  Soccer.  Tennis or Racquetball.  Stair climbing.  Basketball.  Hiking.  Weight lifting.  Aerobic fitness classes. Including weight-bearing physical activity into an exercise plan is a great way to keep bones healthy. Adults: Engage in at least 30 minutes of moderate physical activity on most, preferably all, days of the week. Children: Engage in at least 60 minutes of moderate physical activity on most, preferably all, days of the week. FOR MORE INFORMATION [place] Department of Agriculture, Center for Nutrition Policy and Promotion: www.cnpp.usda.gov National Osteoporosis Foundation: www.nof.org Document Released: 10/24/2003 Document Revised: 11/28/2012 Document Reviewed: 01/23/2009 Exit[employer] Patient Information   2015 [employer]. This information is not intended to replace advice given to you by your health care provider. Make sure you discuss any questions you have with your health care provider.  

## 2014-08-28 LAB — NMR, LIPOPROFILE
CHOLESTEROL: 227 mg/dL — AB (ref 100–199)
HDL CHOLESTEROL BY NMR: 87 mg/dL (ref >39)
HDL PARTICLE NUMBER: 43.3 umol/L (ref >=30.5)
LDL Particle Number: 1048 nmol/L — ABNORMAL HIGH (ref <1000)
LDL Size: 21.5 nm (ref >20.5)
LDL-C: 121 mg/dL — ABNORMAL HIGH (ref 0–99)
LP-IR Score: <25 (ref <=45)
Triglycerides by NMR: 95 mg/dL (ref 0–149)

## 2014-08-28 LAB — CMP14+EGFR
A/G RATIO: 2 (ref 1.1–2.5)
ALT: 27 IU/L (ref 0–32)
AST: 27 IU/L (ref 0–40)
Albumin: 4.2 g/dL (ref 3.5–5.5)
Alkaline Phosphatase: 78 IU/L (ref 39–117)
BUN/Creatinine Ratio: 11 (ref 9–23)
BUN: 9 mg/dL (ref 6–24)
CALCIUM: 10 mg/dL (ref 8.7–10.2)
CO2: 26 mmol/L (ref 18–29)
Chloride: 100 mmol/L (ref 97–108)
Creatinine, Ser: 0.81 mg/dL (ref 0.57–1.00)
GFR calc Af Amer: 95 mL/min/{1.73_m2} (ref >59)
GFR, EST NON AFRICAN AMERICAN: 83 mL/min/{1.73_m2} (ref >59)
GLUCOSE: 208 mg/dL — AB (ref 65–99)
Globulin, Total: 2.1 g/dL (ref 1.5–4.5)
Potassium: 4.6 mmol/L (ref 3.5–5.2)
Sodium: 142 mmol/L (ref 134–144)
TOTAL PROTEIN: 6.3 g/dL (ref 6.0–8.5)
Total Bilirubin: 0.3 mg/dL (ref 0.0–1.2)

## 2014-10-29 ENCOUNTER — Ambulatory Visit (INDEPENDENT_AMBULATORY_CARE_PROVIDER_SITE_OTHER): Payer: BLUE CROSS/BLUE SHIELD | Admitting: Nurse Practitioner

## 2014-10-29 ENCOUNTER — Ambulatory Visit: Payer: BLUE CROSS/BLUE SHIELD | Admitting: Nurse Practitioner

## 2014-10-29 ENCOUNTER — Encounter (INDEPENDENT_AMBULATORY_CARE_PROVIDER_SITE_OTHER): Payer: Self-pay

## 2014-10-29 ENCOUNTER — Encounter: Payer: Self-pay | Admitting: Nurse Practitioner

## 2014-10-29 VITALS — BP 144/69 | HR 86 | Temp 97.3°F | Ht 66.0 in | Wt 167.0 lb

## 2014-10-29 DIAGNOSIS — J069 Acute upper respiratory infection, unspecified: Secondary | ICD-10-CM | POA: Diagnosis not present

## 2014-10-29 NOTE — Patient Instructions (Signed)

## 2014-10-29 NOTE — Progress Notes (Signed)
   Subjective:    Patient ID: ONDRIA OSWALD, female    DOB: [birth year], 54 y.o.   MRN: [id number]  HPI Patienyt in c/o of a tickle or "Hairball" in her throat . Was coughing so hard this morning that it made her throw up. SHe denies fever,cough and congestion.    Review of Systems  Constitutional: Negative for fever, appetite change and fatigue.  HENT: Negative for congestion, ear discharge, facial swelling, postnasal drip and sore throat.   Respiratory: Positive for cough.   Cardiovascular: Negative.   Gastrointestinal: Negative.   Genitourinary: Negative.   Neurological: Negative.   Psychiatric/Behavioral: Negative.   All other systems reviewed and are negative.      Objective:   Physical Exam  Constitutional: She is oriented to person, place, and time. She appears well-developed and well-nourished.  HENT:  Right Ear: External ear normal.  Left Ear: External ear normal.  Nose: Nose normal.  Mouth/Throat: Oropharynx is clear and moist.  Eyes: Pupils are equal, round, and reactive to light.  Neck: Normal range of motion. Neck supple.  Cardiovascular: Normal rate, regular rhythm and normal heart sounds.   Pulmonary/Chest: Effort normal and breath sounds normal.  Neurological: She is alert and oriented to person, place, and time.  Skin: Skin is warm and dry.  Psychiatric: She has a normal mood and affect. Her behavior is normal. Judgment and thought content normal.    BP 144/69 mmHg  Pulse 86  Temp(Src) 97.3 F (36.3 C) (Oral)  Ht 5\' 6"  (1.676 m)  Wt 167 lb (75.751 kg)  BMI 26.97 kg/m2         Assessment & Plan:   1. Viral upper respiratory infection    1. Take meds as prescribed 2. Use a cool mist humidifier especially during the winter months and when heat has been humid. 3. Use saline nose sprays frequently 4. Saline irrigations of the nose can be very helpful if done frequently.  * 4X daily for 1 week*  * Use of a nettie pot can be helpful with this.  Follow directions with this* 5. Drink plenty of fluids 6. Keep thermostat turn down low 7.For any cough or congestion  Use plain Mucinex- regular strength or max strength is fine   * Children- consult with Pharmacist for dosing 8. For fever or aces or pains- take tylenol or ibuprofen appropriate for age and weight.  * for fevers greater than 101 orally you may alternate ibuprofen and tylenol every  3 hours.   Mary-Margaret Hassell Done, FNP

## 2015-01-03 ENCOUNTER — Other Ambulatory Visit: Payer: Self-pay | Admitting: Nurse Practitioner

## 2015-01-31 ENCOUNTER — Other Ambulatory Visit: Payer: Self-pay | Admitting: Nurse Practitioner

## 2015-03-07 ENCOUNTER — Other Ambulatory Visit: Payer: Self-pay | Admitting: Nurse Practitioner

## 2015-03-07 NOTE — Telephone Encounter (Signed)
Last seen 10/29/14 MMM  Last thyroid level 04/10/13

## 2015-03-07 NOTE — Telephone Encounter (Signed)
no more refills without being seen Patient NTBS for follow up and lab work

## 2015-03-18 HISTORY — PX: BACK SURGERY: SHX140

## 2015-03-23 ENCOUNTER — Other Ambulatory Visit: Payer: Self-pay | Admitting: Nurse Practitioner

## 2015-03-23 NOTE — Telephone Encounter (Signed)
no more refills without being seen  

## 2015-03-23 NOTE — Telephone Encounter (Signed)
No TSH since 2014

## 2015-03-26 ENCOUNTER — Other Ambulatory Visit: Payer: Self-pay | Admitting: Orthopedic Surgery

## 2015-04-02 ENCOUNTER — Other Ambulatory Visit (HOSPITAL_COMMUNITY): Payer: Self-pay

## 2015-04-03 ENCOUNTER — Ambulatory Visit: Payer: BLUE CROSS/BLUE SHIELD | Admitting: Nurse Practitioner

## 2015-04-03 ENCOUNTER — Encounter (HOSPITAL_COMMUNITY)
Admission: RE | Admit: 2015-04-03 | Discharge: 2015-04-03 | Disposition: A | Discharge status: Home / Self Care | Payer: BLUE CROSS/BLUE SHIELD | Source: Ambulatory Visit | Attending: Orthopedic Surgery | Admitting: Orthopedic Surgery

## 2015-04-03 ENCOUNTER — Encounter (HOSPITAL_COMMUNITY): Payer: Self-pay

## 2015-04-03 DIAGNOSIS — Z01818 Encounter for other preprocedural examination: Secondary | ICD-10-CM

## 2015-04-03 HISTORY — DX: Unspecified osteoarthritis, unspecified site: M19.90

## 2015-04-03 HISTORY — DX: Other specified postprocedural states: Z98.890

## 2015-04-03 HISTORY — DX: Hypothyroidism, unspecified: E03.9

## 2015-04-03 HISTORY — DX: Nausea with vomiting, unspecified: R11.2

## 2015-04-03 HISTORY — DX: Unspecified asthma, uncomplicated: J45.909

## 2015-04-03 LAB — CBC WITH DIFFERENTIAL/PLATELET
BASOS ABS: 0.1 10*3/uL (ref 0.0–0.1)
Basophils Relative: 1 % (ref 0–1)
EOS PCT: 3 % (ref 0–5)
Eosinophils Absolute: 0.2 10*3/uL (ref 0.0–0.7)
HEMATOCRIT: 42.6 % (ref 36.0–46.0)
HEMOGLOBIN: 14.4 g/dL (ref 12.0–15.0)
LYMPHS ABS: 1.8 10*3/uL (ref 0.7–4.0)
LYMPHS PCT: 28 % (ref 12–46)
MCH: 30.6 pg (ref 26.0–34.0)
MCHC: 33.8 g/dL (ref 30.0–36.0)
MCV: 90.4 fL (ref 78.0–100.0)
Monocytes Absolute: 0.4 10*3/uL (ref 0.1–1.0)
Monocytes Relative: 6 % (ref 3–12)
NEUTROS ABS: 3.8 10*3/uL (ref 1.7–7.7)
NEUTROS PCT: 62 % (ref 43–77)
Platelets: 289 10*3/uL (ref 150–400)
RBC: 4.71 MIL/uL (ref 3.87–5.11)
RDW: 12.2 % (ref 11.5–15.5)
WBC: 6.3 10*3/uL (ref 4.0–10.5)

## 2015-04-03 LAB — COMPREHENSIVE METABOLIC PANEL
ALK PHOS: 73 U/L (ref 38–126)
ALT: 24 U/L (ref 14–54)
AST: 25 U/L (ref 15–41)
Albumin: 4.1 g/dL (ref 3.5–5.0)
Anion gap: 11 (ref 5–15)
BILIRUBIN TOTAL: 0.6 mg/dL (ref 0.3–1.2)
BUN: 11 mg/dL (ref 6–20)
CALCIUM: 9.7 mg/dL (ref 8.9–10.3)
CHLORIDE: 102 mmol/L (ref 101–111)
CO2: 26 mmol/L (ref 22–32)
CREATININE: 0.74 mg/dL (ref 0.44–1.00)
Glucose, Bld: 121 mg/dL — ABNORMAL HIGH (ref 65–99)
Potassium: 3.9 mmol/L (ref 3.5–5.1)
Sodium: 139 mmol/L (ref 135–145)
Total Protein: 6.8 g/dL (ref 6.5–8.1)

## 2015-04-03 LAB — URINALYSIS, ROUTINE W REFLEX MICROSCOPIC
Bilirubin Urine: NEGATIVE
GLUCOSE, UA: NEGATIVE mg/dL
Hgb urine dipstick: NEGATIVE
KETONES UR: NEGATIVE mg/dL
LEUKOCYTES UA: NEGATIVE
NITRITE: NEGATIVE
PH: 6.5 (ref 5.0–8.0)
PROTEIN: NEGATIVE mg/dL
Specific Gravity, Urine: 1.004 — ABNORMAL LOW (ref 1.005–1.030)
Urobilinogen, UA: 0.2 mg/dL (ref 0.0–1.0)

## 2015-04-03 LAB — SURGICAL PCR SCREEN
MRSA, PCR: NEGATIVE
STAPHYLOCOCCUS AUREUS: POSITIVE — AB

## 2015-04-03 LAB — PROTIME-INR
INR: 0.9 (ref 0.00–1.49)
PROTHROMBIN TIME: 12.4 s (ref 11.6–15.2)

## 2015-04-03 LAB — TYPE AND SCREEN
ABO/RH(D): A POS
Antibody Screen: NEGATIVE

## 2015-04-03 LAB — ABO/RH: ABO/RH(D): A POS

## 2015-04-03 LAB — APTT: APTT: 30 s (ref 24–37)

## 2015-04-03 LAB — GLUCOSE, CAPILLARY: Glucose-Capillary: 164 mg/dL — ABNORMAL HIGH (ref 65–99)

## 2015-04-03 MED ORDER — POVIDONE-IODINE 7.5 % EX SOLN
CUTANEOUS | Status: DC
  Filled 2015-04-03: qty 118

## 2015-04-03 MED ORDER — CEFAZOLIN SODIUM-DEXTROSE 2-3 GM-% IV SOLR
2.0000 g | INTRAVENOUS | Status: AC
  Administered 2015-04-04: 2 g via INTRAVENOUS
  Filled 2015-04-03: qty 50

## 2015-04-03 NOTE — H&P (Signed)
     PREOPERATIVE H&P  Chief Complaint: Right leg pain  HPI: Alejandra Mcdonald is a 54 y.o. female who presents with ongoing pain in the right leg  MRI reveals stenosis at L4/5 and xrays reveal instability  Patient has failed multiple forms of conservative care and continues to have pain (see office notes for additional details regarding the patient's full course of treatment)  Past Medical History  Diagnosis Date  . Diabetes mellitus without complication   . Thyroid disease   . Hyperlipidemia   . Vasomotor phenomenon   . PONV (postoperative nausea and vomiting)   . Asthma     as child   no problem in 3 years  . Hypothyroidism   . Arthritis    Past Surgical History  Procedure Laterality Date  . Appendectomy    . Tubal ligation    . Bilateral carpal tunnel release    . Cesarean section     Social History   Social History  . Marital Status: Married    Spouse Name: N/A  . Number of Children: N/A  . Years of Education: N/A   Social History Main Topics  . Smoking status: Never Smoker   . Smokeless tobacco: Not on file  . Alcohol Use: No  . Drug Use: No  . Sexual Activity: Not on file   Other Topics Concern  . Not on file   Social History Narrative   Family History  Problem Relation Age of Onset  . Hypertension Mother   . Diabetes Mother   . Hypertension Father   . Healthy Brother   . Healthy Brother   . Healthy Brother    Allergies  Allergen Reactions  . Ace Inhibitors Cough  . Codeine     Vomiting and nausea.  . Lipitor [Atorvastatin] Other (See Comments)    Myalgias   Prior to Admission medications   Medication Sig Start Date End Date Taking? Authorizing Provider  calcium citrate-vitamin D 200-200 MG-UNIT TABS Take 1 tablet by mouth 2 (two) times daily.    Yes Historical Provider, MD  insulin aspart (NOVOLOG) 100 UNIT/ML injection Per pump 08/27/14  Yes Mary-Margaret Hassell Done, FNP  levothyroxine (SYNTHROID, LEVOTHROID) 200 MCG tablet TAKE 1 TABLET BY  MOUTH DAILY BEFORE BREAKFAST 03/23/15  Yes Mary-Margaret Hassell Done, FNP  Multiple Vitamins-Minerals (MULTIVITAMIN,TX-MINERALS) tablet Take 1 tablet by mouth daily.     Yes Historical Provider, MD  polyethylene glycol powder (GLYCOLAX/MIRALAX) powder MIX 17GRAMS (1 CAPFUL) WITH 8 OUNCES OF LIQUID AND DRINK BY MOUTH 255 GRAMS ONCE DAILY 01/04/15  Yes Mary-Margaret Hassell Done, FNP  simvastatin (ZOCOR) 40 MG tablet Take 1 tablet (40 mg total) by mouth every evening. 08/27/14  Yes Mary-Margaret Hassell Done, FNP  aspirin 81 MG tablet Take 81 mg by mouth daily.      Historical Provider, MD     All other systems have been reviewed and were otherwise negative with the exception of those mentioned in the HPI and as above.  Physical Exam: There were no vitals filed for this visit.  General: Alert, no acute distress Cardiovascular: No pedal edema Respiratory: No cyanosis, no use of accessory musculature Skin: No lesions in the area of chief complaint Neurologic: Sensation intact distally Psychiatric: Patient is competent for consent with normal mood and affect Lymphatic: No axillary or cervical lymphadenopathy  MUSCULOSKELETAL: + SLR on right  Assessment/Plan: Right leg pain Plan for Procedure(s): POSTERIOR LUMBAR FUSION 1 LEVEL   Sinclair Ship, MD 04/03/2015 10:35 AM

## 2015-04-03 NOTE — Pre-Procedure Instructions (Addendum)
Alejandra Mcdonald  04/03/2015      [id number] - [place], [place] [address] [address] Phone: [phone] Fax: [phone]  [id number] - [place], [place] [address] [place] [address] Phone: [phone] Fax: [phone]    Your procedure is scheduled on Thursday, April 04, 2015  Report to [facility] Admitting at 5:30 A.M.  Call this number if you have problems the morning of surgery:  [phone]   Remember:  Do not eat food or drink liquids after midnight.  Take these medicines the morning of surgery with A SIP OF WATER :levothyroxine (SYNTHROID, LEVOTHROID) Stop taking Aspirin, vitamins and herbal medications. Do not take any NSAIDs ie: Ibuprofen, Advil, Naproxen or any medication containing Aspirin.  Do not wear jewelry, make-up or nail polish.  Do not wear lotions, powders, or perfumes.  You may not wear deodorant.  Do not shave 48 hours prior to surgery.    Do not bring valuables to the hospital.  [facility] is not responsible for any belongings or valuables.  Contacts, dentures or bridgework may not be worn into surgery.  Leave your suitcase in the car.  After surgery it may be brought to your room.  For patients admitted to the hospital, discharge time will be determined by your treatment team.  Patients discharged the day of surgery will not be allowed to drive home.   Name and phone number of your driver:  Special instructions: Shower the night before surgery and the morning of surgery with CHG.  Please read over the following fact sheets that you were given. Pain Booklet, Coughing and Deep Breathing, Blood Transfusion Information, MRSA Information and Surgical Site Infection Prevention      How to Manage Your Diabetes Before Surgery   Why is it important to control my blood sugar before and after surgery?   Improving blood  sugar levels before and after surgery helps healing and can limit problems.  A way of improving blood sugar control is eating a healthy diet by:  - Eating less sugar and carbohydrates  - Increasing activity/exercise  - Talk with your doctor about reaching your blood sugar goals  High blood sugars (greater than 180 mg/dL) can raise your risk of infections and slow down your recovery so you will need to focus on controlling your diabetes during the weeks before surgery.  Make sure that the doctor who takes care of your diabetes knows about your planned surgery including the date and location.  How do I manage my blood sugars before surgery?   Check your blood sugar at least 4 times a day, 2 days before surgery to make sure that they are not too high or low.   Check your blood sugar the morning of your surgery when you wake up and every 2               hours until you get to the Short-Stay unit.  If your blood sugar is less than 70 mg/dL, you will need to treat for low blood sugar by:  Treat a low blood sugar (less than 70 mg/dL) with 1/2 cup of clear juice (cranberry or apple), 4 glucose tablets, OR glucose gel.  Recheck blood sugar in 15 minutes after treatment (to make sure it is greater than 70 mg/dL).  If blood sugar is not greater than 70 mg/dL on  re-check, call [phone] for further instructions.   Report your blood sugar to the Short-Stay nurse when you get to Short-Stay.  References:  [facility], 2007 "How to Manage your Diabetes Before and After Surgery".  What do I do about my diabetes medications?   Do not take oral diabetes medicines (pills) the morning of surgery.  THE NIGHT BEFORE SURGERY,     THE MORNING OF SURGERY    Do not take other diabetes injectables the day of surgery including Byetta, Victoza, Bydureon, and Trulicity.    If your CBG is greater than 220 mg/dL, you may take 1/2 of your sliding scale (correction) dose of  insulin.   For patients with "Insulin Pumps":  Contact your diabetes doctor for specific instructions before surgery.   Decrease basal insulin rates by 20% at midnight the night before surgery.  Note that if your surgery is planned to be longer than 2 hours, your insulin pump will be removed and intravenous (IV) insulin will be started and managed by the nurses and anesthesiologist.  You will be able to restart your insulin pump once you are awake and able to manage it.  Make sure to bring insulin pump supplies to the hospital with you in case your site needs to be changed.

## 2015-04-03 NOTE — Progress Notes (Signed)
NOTIFIED DIABETES COORDINATOR (ANN) OF PATIENT HAVING INSULIN PUMP AND SCHEDULED FOR SURGERY TOMORROW 04/04/15 730 AM.

## 2015-04-04 ENCOUNTER — Inpatient Hospital Stay (HOSPITAL_COMMUNITY): Payer: BLUE CROSS/BLUE SHIELD

## 2015-04-04 ENCOUNTER — Inpatient Hospital Stay (HOSPITAL_COMMUNITY): Payer: BLUE CROSS/BLUE SHIELD | Admitting: Certified Registered"

## 2015-04-04 ENCOUNTER — Encounter (HOSPITAL_COMMUNITY)
Admission: RE | Disposition: A | Discharge status: Home / Self Care | Payer: BLUE CROSS/BLUE SHIELD | Source: Ambulatory Visit | Attending: Orthopedic Surgery

## 2015-04-04 ENCOUNTER — Inpatient Hospital Stay (HOSPITAL_COMMUNITY)
Admission: RE | Admit: 2015-04-04 | Discharge: 2015-04-06 | DRG: 460 | Disposition: A | Discharge status: Home / Self Care | Payer: BLUE CROSS/BLUE SHIELD | Source: Ambulatory Visit | Attending: Orthopedic Surgery | Admitting: Orthopedic Surgery

## 2015-04-04 ENCOUNTER — Encounter (HOSPITAL_COMMUNITY): Payer: Self-pay | Admitting: *Deleted

## 2015-04-04 DIAGNOSIS — M5416 Radiculopathy, lumbar region: Secondary | ICD-10-CM | POA: Diagnosis present

## 2015-04-04 DIAGNOSIS — Z7982 Long term (current) use of aspirin: Secondary | ICD-10-CM

## 2015-04-04 DIAGNOSIS — E785 Hyperlipidemia, unspecified: Secondary | ICD-10-CM | POA: Diagnosis present

## 2015-04-04 DIAGNOSIS — Z9641 Presence of insulin pump (external) (internal): Secondary | ICD-10-CM | POA: Diagnosis present

## 2015-04-04 DIAGNOSIS — E039 Hypothyroidism, unspecified: Secondary | ICD-10-CM | POA: Diagnosis present

## 2015-04-04 DIAGNOSIS — M4316 Spondylolisthesis, lumbar region: Secondary | ICD-10-CM | POA: Diagnosis present

## 2015-04-04 DIAGNOSIS — R059 Cough, unspecified: Secondary | ICD-10-CM

## 2015-04-04 DIAGNOSIS — M1990 Unspecified osteoarthritis, unspecified site: Secondary | ICD-10-CM | POA: Diagnosis present

## 2015-04-04 DIAGNOSIS — M4806 Spinal stenosis, lumbar region: Principal | ICD-10-CM | POA: Diagnosis present

## 2015-04-04 DIAGNOSIS — Z885 Allergy status to narcotic agent status: Secondary | ICD-10-CM

## 2015-04-04 DIAGNOSIS — Z419 Encounter for procedure for purposes other than remedying health state, unspecified: Secondary | ICD-10-CM

## 2015-04-04 DIAGNOSIS — Z79899 Other long term (current) drug therapy: Secondary | ICD-10-CM

## 2015-04-04 DIAGNOSIS — M79604 Pain in right leg: Secondary | ICD-10-CM | POA: Diagnosis present

## 2015-04-04 DIAGNOSIS — R05 Cough: Secondary | ICD-10-CM

## 2015-04-04 DIAGNOSIS — Z794 Long term (current) use of insulin: Secondary | ICD-10-CM

## 2015-04-04 DIAGNOSIS — M5410 Radiculopathy, site unspecified: Secondary | ICD-10-CM | POA: Diagnosis present

## 2015-04-04 DIAGNOSIS — E119 Type 2 diabetes mellitus without complications: Secondary | ICD-10-CM | POA: Diagnosis present

## 2015-04-04 DIAGNOSIS — Z888 Allergy status to other drugs, medicaments and biological substances status: Secondary | ICD-10-CM | POA: Diagnosis not present

## 2015-04-04 DIAGNOSIS — J40 Bronchitis, not specified as acute or chronic: Secondary | ICD-10-CM

## 2015-04-04 LAB — GLUCOSE, CAPILLARY
GLUCOSE-CAPILLARY: 105 mg/dL — AB (ref 65–99)
GLUCOSE-CAPILLARY: 128 mg/dL — AB (ref 65–99)
GLUCOSE-CAPILLARY: 188 mg/dL — AB (ref 65–99)
GLUCOSE-CAPILLARY: 189 mg/dL — AB (ref 65–99)
Glucose-Capillary: 181 mg/dL — ABNORMAL HIGH (ref 65–99)
Glucose-Capillary: 163 mg/dL — ABNORMAL HIGH (ref 65–99)
Glucose-Capillary: 156 mg/dL — ABNORMAL HIGH (ref 65–99)
Glucose-Capillary: 142 mg/dL — ABNORMAL HIGH (ref 65–99)
Glucose-Capillary: 178 mg/dL — ABNORMAL HIGH (ref 65–99)
Glucose-Capillary: 98 mg/dL (ref 65–99)

## 2015-04-04 LAB — HEMOGLOBIN A1C
HEMOGLOBIN A1C: 7.4 % — AB (ref 4.8–5.6)
MEAN PLASMA GLUCOSE: 166 mg/dL

## 2015-04-04 SURGERY — POSTERIOR LUMBAR FUSION 1 LEVEL
Anesthesia: General | Site: Back | Laterality: Right

## 2015-04-04 MED ORDER — MENTHOL 3 MG MT LOZG: 1.0000 | LOZENGE | OROMUCOSAL | Status: DC | PRN

## 2015-04-04 MED ORDER — LACTATED RINGERS IV SOLN
INTRAVENOUS | Status: DC | PRN
  Administered 2015-04-04: 07:00:00 via INTRAVENOUS

## 2015-04-04 MED ORDER — PROPOFOL INFUSION 10 MG/ML OPTIME
INTRAVENOUS | Status: DC | PRN
  Administered 2015-04-04: 100 ug/kg/min via INTRAVENOUS

## 2015-04-04 MED ORDER — ONDANSETRON HCL 4 MG/2ML IJ SOLN
4.0000 mg | INTRAMUSCULAR | Status: DC | PRN
  Administered 2015-04-05 (×4): 4 mg via INTRAVENOUS
  Filled 2015-04-04 (×4): qty 2

## 2015-04-04 MED ORDER — METHYLENE BLUE 1 % INJ SOLN
INTRAMUSCULAR | Status: AC
  Filled 2015-04-04: qty 10

## 2015-04-04 MED ORDER — SIMVASTATIN 40 MG PO TABS
40.0000 mg | ORAL_TABLET | Freq: Every evening | ORAL | Status: DC
  Administered 2015-04-05: 40 mg via ORAL
  Filled 2015-04-04 (×3): qty 1

## 2015-04-04 MED ORDER — PROPOFOL 10 MG/ML IV BOLUS
INTRAVENOUS | Status: DC | PRN
  Administered 2015-04-04: 150 mg via INTRAVENOUS

## 2015-04-04 MED ORDER — ZOLPIDEM TARTRATE 5 MG PO TABS: 5.0000 mg | ORAL_TABLET | Freq: Every evening | ORAL | Status: DC | PRN

## 2015-04-04 MED ORDER — HYDROMORPHONE HCL 1 MG/ML IJ SOLN
INTRAMUSCULAR | Status: AC
  Filled 2015-04-04: qty 1

## 2015-04-04 MED ORDER — PROMETHAZINE HCL 25 MG/ML IJ SOLN
INTRAMUSCULAR | Status: AC
  Filled 2015-04-04: qty 1

## 2015-04-04 MED ORDER — CEFAZOLIN SODIUM 1-5 GM-% IV SOLN
1.0000 g | Freq: Three times a day (TID) | INTRAVENOUS | Status: AC
  Administered 2015-04-04 (×2): 1 g via INTRAVENOUS
  Filled 2015-04-04 (×2): qty 50

## 2015-04-04 MED ORDER — ACETAMINOPHEN 325 MG PO TABS
ORAL_TABLET | ORAL | Status: DC | PRN
  Administered 2015-04-04: 1500 mg via ORAL

## 2015-04-04 MED ORDER — SUGAMMADEX SODIUM 200 MG/2ML IV SOLN
INTRAVENOUS | Status: DC | PRN
  Administered 2015-04-04: 152.2 mg via INTRAVENOUS

## 2015-04-04 MED ORDER — PROMETHAZINE HCL 25 MG/ML IJ SOLN
12.5000 mg | INTRAMUSCULAR | Status: DC | PRN
  Administered 2015-04-04: 12.5 mg via INTRAVENOUS
  Filled 2015-04-04: qty 1

## 2015-04-04 MED ORDER — PROPOFOL 10 MG/ML IV BOLUS
INTRAVENOUS | Status: AC
  Filled 2015-04-04: qty 20

## 2015-04-04 MED ORDER — LEVOTHYROXINE SODIUM 200 MCG PO TABS
200.0000 ug | ORAL_TABLET | Freq: Every day | ORAL | Status: DC
  Administered 2015-04-05 – 2015-04-06 (×2): 200 ug via ORAL
  Filled 2015-04-04 (×3): qty 1

## 2015-04-04 MED ORDER — SODIUM CHLORIDE 0.9 % IJ SOLN: 3.0000 mL | INTRAMUSCULAR | Status: DC | PRN

## 2015-04-04 MED ORDER — FENTANYL CITRATE (PF) 100 MCG/2ML IJ SOLN
INTRAMUSCULAR | Status: DC | PRN
  Administered 2015-04-04 (×2): 100 ug via INTRAVENOUS
  Administered 2015-04-04 (×2): 150 ug via INTRAVENOUS

## 2015-04-04 MED ORDER — MUPIROCIN 2 % EX OINT
1.0000 "application " | TOPICAL_OINTMENT | Freq: Two times a day (BID) | CUTANEOUS | Status: DC
  Administered 2015-04-04 – 2015-04-05 (×3): 1 via NASAL

## 2015-04-04 MED ORDER — KETOROLAC TROMETHAMINE 30 MG/ML IJ SOLN
INTRAMUSCULAR | Status: DC | PRN
  Administered 2015-04-04: 30 mg via INTRAVENOUS

## 2015-04-04 MED ORDER — SODIUM CHLORIDE 0.9 % IV SOLN
INTRAVENOUS | Status: DC
  Administered 2015-04-04: 75 mL/h via INTRAVENOUS

## 2015-04-04 MED ORDER — SODIUM CHLORIDE 0.9 % IV SOLN: 250.0000 mL | INTRAVENOUS | Status: DC

## 2015-04-04 MED ORDER — ROCURONIUM BROMIDE 100 MG/10ML IV SOLN
INTRAVENOUS | Status: DC | PRN
  Administered 2015-04-04: 50 mg via INTRAVENOUS

## 2015-04-04 MED ORDER — LEVALBUTEROL HCL 1.25 MG/0.5ML IN NEBU
1.2500 mg | INHALATION_SOLUTION | Freq: Once | RESPIRATORY_TRACT | Status: DC
  Filled 2015-04-04: qty 0.5

## 2015-04-04 MED ORDER — ACETAMINOPHEN 500 MG PO TABS
ORAL_TABLET | ORAL | Status: AC
  Filled 2015-04-04: qty 3

## 2015-04-04 MED ORDER — ACETAMINOPHEN 650 MG RE SUPP: 650.0000 mg | RECTAL | Status: DC | PRN

## 2015-04-04 MED ORDER — MIDAZOLAM HCL 2 MG/2ML IJ SOLN: 0.5000 mg | Freq: Once | INTRAMUSCULAR | Status: DC | PRN

## 2015-04-04 MED ORDER — BUPIVACAINE-EPINEPHRINE 0.25% -1:200000 IJ SOLN
INTRAMUSCULAR | Status: DC | PRN
  Administered 2015-04-04: 27 mL

## 2015-04-04 MED ORDER — MORPHINE SULFATE (PF) 2 MG/ML IV SOLN
1.0000 mg | INTRAVENOUS | Status: DC | PRN
  Administered 2015-04-04 – 2015-04-05 (×3): 2 mg via INTRAVENOUS
  Filled 2015-04-04 (×3): qty 1

## 2015-04-04 MED ORDER — PROMETHAZINE HCL 25 MG PO TABS
25.0000 mg | ORAL_TABLET | ORAL | Status: DC | PRN
  Administered 2015-04-04: 25 mg via ORAL
  Filled 2015-04-04: qty 1

## 2015-04-04 MED ORDER — 0.9 % SODIUM CHLORIDE (POUR BTL) OPTIME
TOPICAL | Status: DC | PRN
  Administered 2015-04-04: 1000 mL

## 2015-04-04 MED ORDER — PROMETHAZINE HCL 25 MG RE SUPP
25.0000 mg | RECTAL | Status: DC | PRN
  Filled 2015-04-04: qty 1

## 2015-04-04 MED ORDER — SENNOSIDES-DOCUSATE SODIUM 8.6-50 MG PO TABS: 1.0000 | ORAL_TABLET | Freq: Every evening | ORAL | Status: DC | PRN

## 2015-04-04 MED ORDER — ADULT MULTIVITAMIN W/MINERALS CH
1.0000 | ORAL_TABLET | Freq: Every day | ORAL | Status: DC
Start: 2015-04-05 — End: 2015-04-06
  Administered 2015-04-05: 1 via ORAL
  Filled 2015-04-04 (×3): qty 1

## 2015-04-04 MED ORDER — HYDROMORPHONE HCL 1 MG/ML IJ SOLN
0.2500 mg | INTRAMUSCULAR | Status: DC | PRN
  Administered 2015-04-04 (×4): 0.5 mg via INTRAVENOUS

## 2015-04-04 MED ORDER — DIAZEPAM 5 MG PO TABS
5.0000 mg | ORAL_TABLET | Freq: Four times a day (QID) | ORAL | Status: DC | PRN
  Administered 2015-04-04 – 2015-04-05 (×3): 5 mg via ORAL
  Filled 2015-04-04 (×3): qty 1

## 2015-04-04 MED ORDER — ACETAMINOPHEN 325 MG PO TABS
650.0000 mg | ORAL_TABLET | ORAL | Status: DC | PRN
  Administered 2015-04-05: 650 mg via ORAL
  Filled 2015-04-04: qty 2

## 2015-04-04 MED ORDER — LIDOCAINE HCL (CARDIAC) 20 MG/ML IV SOLN
INTRAVENOUS | Status: DC | PRN
  Administered 2015-04-04: 80 mg via INTRAVENOUS

## 2015-04-04 MED ORDER — FLEET ENEMA 7-19 GM/118ML RE ENEM: 1.0000 | ENEMA | Freq: Once | RECTAL | Status: DC | PRN

## 2015-04-04 MED ORDER — THROMBIN 20000 UNITS EX SOLR
CUTANEOUS | Status: AC
  Filled 2015-04-04: qty 20000

## 2015-04-04 MED ORDER — SODIUM CHLORIDE 0.9 % IV SOLN
250.0000 [IU] | INTRAVENOUS | Status: DC | PRN
  Administered 2015-04-04: 2.6 [IU]/h via INTRAVENOUS

## 2015-04-04 MED ORDER — PROMETHAZINE HCL 25 MG/ML IJ SOLN
6.2500 mg | INTRAMUSCULAR | Status: DC | PRN
  Administered 2015-04-04: 6.25 mg via INTRAVENOUS

## 2015-04-04 MED ORDER — ONDANSETRON HCL 4 MG/2ML IJ SOLN
INTRAMUSCULAR | Status: DC | PRN
  Administered 2015-04-04: 12 mg via INTRAVENOUS

## 2015-04-04 MED ORDER — ALUM & MAG HYDROXIDE-SIMETH 200-200-20 MG/5ML PO SUSP: 30.0000 mL | Freq: Four times a day (QID) | ORAL | Status: DC | PRN

## 2015-04-04 MED ORDER — FENTANYL CITRATE (PF) 250 MCG/5ML IJ SOLN
INTRAMUSCULAR | Status: AC
  Filled 2015-04-04: qty 5

## 2015-04-04 MED ORDER — MIDAZOLAM HCL 2 MG/2ML IJ SOLN
INTRAMUSCULAR | Status: AC
  Filled 2015-04-04: qty 4

## 2015-04-04 MED ORDER — SUCCINYLCHOLINE CHLORIDE 20 MG/ML IJ SOLN
INTRAMUSCULAR | Status: DC | PRN
  Administered 2015-04-04: 100 mg via INTRAVENOUS

## 2015-04-04 MED ORDER — PHENOL 1.4 % MT LIQD: 1.0000 | OROMUCOSAL | Status: DC | PRN

## 2015-04-04 MED ORDER — SCOPOLAMINE 1 MG/3DAYS TD PT72
MEDICATED_PATCH | TRANSDERMAL | Status: AC
  Administered 2015-04-04: 1 via TRANSDERMAL
  Filled 2015-04-04: qty 1

## 2015-04-04 MED ORDER — SODIUM CHLORIDE 0.9 % IV SOLN
INTRAVENOUS | Status: DC
  Filled 2015-04-04: qty 2.5

## 2015-04-04 MED ORDER — CALCIUM CARBONATE-VITAMIN D 500-200 MG-UNIT PO TABS
1.0000 | ORAL_TABLET | Freq: Two times a day (BID) | ORAL | Status: DC
  Administered 2015-04-05: 1 via ORAL
  Filled 2015-04-04 (×5): qty 1

## 2015-04-04 MED ORDER — SODIUM CHLORIDE 0.9 % IJ SOLN
3.0000 mL | Freq: Two times a day (BID) | INTRAMUSCULAR | Status: DC
  Administered 2015-04-05: 3 mL via INTRAVENOUS

## 2015-04-04 MED ORDER — BISACODYL 5 MG PO TBEC: 5.0000 mg | DELAYED_RELEASE_TABLET | Freq: Every day | ORAL | Status: DC | PRN

## 2015-04-04 MED ORDER — MIDAZOLAM HCL 5 MG/5ML IJ SOLN
INTRAMUSCULAR | Status: DC | PRN
  Administered 2015-04-04 (×2): 1 mg via INTRAVENOUS

## 2015-04-04 MED ORDER — BUPIVACAINE-EPINEPHRINE (PF) 0.25% -1:200000 IJ SOLN
INTRAMUSCULAR | Status: AC
  Filled 2015-04-04: qty 30

## 2015-04-04 MED ORDER — THROMBIN 20000 UNITS EX KIT
PACK | CUTANEOUS | Status: DC | PRN
  Administered 2015-04-04: 20000 [IU] via TOPICAL

## 2015-04-04 MED ORDER — SODIUM CHLORIDE 0.9 % IV SOLN
76.1000 mg | INTRAVENOUS | Status: DC | PRN
  Administered 2015-04-04: 76.1 mg via INTRAVENOUS

## 2015-04-04 MED ORDER — OXYCODONE-ACETAMINOPHEN 5-325 MG PO TABS: 1.0000 | ORAL_TABLET | ORAL | Status: DC | PRN

## 2015-04-04 MED ORDER — SUGAMMADEX SODIUM 200 MG/2ML IV SOLN
INTRAVENOUS | Status: AC
  Filled 2015-04-04: qty 2

## 2015-04-04 MED ORDER — MEPERIDINE HCL 25 MG/ML IJ SOLN: 6.2500 mg | INTRAMUSCULAR | Status: DC | PRN

## 2015-04-04 MED ORDER — DOCUSATE SODIUM 100 MG PO CAPS
100.0000 mg | ORAL_CAPSULE | Freq: Two times a day (BID) | ORAL | Status: DC
  Administered 2015-04-05: 100 mg via ORAL
  Filled 2015-04-04: qty 1

## 2015-04-04 MED ORDER — PHENYLEPHRINE HCL 10 MG/ML IJ SOLN
INTRAMUSCULAR | Status: DC | PRN
  Administered 2015-04-04 (×3): 40 ug via INTRAVENOUS

## 2015-04-04 SURGICAL SUPPLY — 82 items
APL SKNCLS STERI-STRIP NONHPOA (GAUZE/BANDAGES/DRESSINGS) ×1
BENZOIN TINCTURE PRP APPL 2/3 (GAUZE/BANDAGES/DRESSINGS) ×2 IMPLANT
BLADE SURG ROTATE 9660 (MISCELLANEOUS) IMPLANT
BUR PRESCISION 1.7 ELITE (BURR) ×1 IMPLANT
BUR ROUND PRECISION 4.0 (BURR) IMPLANT
BUR SABER RD CUTTING 3.0 (BURR) IMPLANT
CAGE BULLET CONCORDE 9X8X23 (Cage) ×1 IMPLANT
CARTRIDGE OIL MAESTRO DRILL (MISCELLANEOUS) ×1 IMPLANT
CLSR STERI-STRIP ANTIMIC 1/2X4 (GAUZE/BANDAGES/DRESSINGS) ×1 IMPLANT
CONT SPEC STER OR (MISCELLANEOUS) ×2 IMPLANT
COVER MAYO STAND STRL (DRAPES) ×4 IMPLANT
COVER SURGICAL LIGHT HANDLE (MISCELLANEOUS) ×2 IMPLANT
DIFFUSER DRILL AIR PNEUMATIC (MISCELLANEOUS) ×2 IMPLANT
DRAIN CHANNEL 15F RND FF W/TCR (WOUND CARE) IMPLANT
DRAPE C-ARM 42X72 X-RAY (DRAPES) ×2 IMPLANT
DRAPE C-ARMOR (DRAPES) IMPLANT
DRAPE POUCH INSTRU U-SHP 10X18 (DRAPES) ×2 IMPLANT
DRAPE SURG 17X23 STRL (DRAPES) ×8 IMPLANT
DURAPREP 26ML APPLICATOR (WOUND CARE) ×2 IMPLANT
ELECT BLADE 4.0 EZ CLEAN MEGAD (MISCELLANEOUS) ×2
ELECT CAUTERY BLADE 6.4 (BLADE) ×2 IMPLANT
ELECT REM PT RETURN 9FT ADLT (ELECTROSURGICAL) ×2
ELECTRODE BLDE 4.0 EZ CLN MEGD (MISCELLANEOUS) ×1 IMPLANT
ELECTRODE REM PT RTRN 9FT ADLT (ELECTROSURGICAL) ×1 IMPLANT
EVACUATOR SILICONE 100CC (DRAIN) IMPLANT
GAUZE SPONGE 4X4 12PLY STRL (GAUZE/BANDAGES/DRESSINGS) ×2 IMPLANT
GAUZE SPONGE 4X4 16PLY XRAY LF (GAUZE/BANDAGES/DRESSINGS) ×8 IMPLANT
GLOVE BIO SURGEON STRL SZ7 (GLOVE) ×2 IMPLANT
GLOVE BIO SURGEON STRL SZ8 (GLOVE) ×2 IMPLANT
GLOVE BIOGEL PI IND STRL 7.0 (GLOVE) ×1 IMPLANT
GLOVE BIOGEL PI IND STRL 8 (GLOVE) ×1 IMPLANT
GLOVE BIOGEL PI INDICATOR 7.0 (GLOVE) ×1
GLOVE BIOGEL PI INDICATOR 8 (GLOVE) ×1
GOWN STRL REUS W/ TWL LRG LVL3 (GOWN DISPOSABLE) ×2 IMPLANT
GOWN STRL REUS W/ TWL XL LVL3 (GOWN DISPOSABLE) ×1 IMPLANT
GOWN STRL REUS W/TWL LRG LVL3 (GOWN DISPOSABLE) ×4
GOWN STRL REUS W/TWL XL LVL3 (GOWN DISPOSABLE) ×2
IV CATH 14GX2 1/4 (CATHETERS) ×2 IMPLANT
KIT BASIN OR (CUSTOM PROCEDURE TRAY) ×2 IMPLANT
KIT POSITION SURG JACKSON T1 (MISCELLANEOUS) ×2 IMPLANT
KIT ROOM TURNOVER OR (KITS) ×2 IMPLANT
MARKER SKIN DUAL TIP RULER LAB (MISCELLANEOUS) ×2 IMPLANT
MILL MEDIUM DISP (BLADE) ×1 IMPLANT
MIX DBX 10CC 35% BONE (Bone Implant) ×1 IMPLANT
NDL HYPO 25GX1X1/2 BEV (NEEDLE) ×1 IMPLANT
NDL SAFETY ECLIPSE 18X1.5 (NEEDLE) ×1 IMPLANT
NDL SPNL 18GX3.5 QUINCKE PK (NEEDLE) ×2 IMPLANT
NEEDLE 22X1 1/2 (OR ONLY) (NEEDLE) ×2 IMPLANT
NEEDLE HYPO 18GX1.5 SHARP (NEEDLE) ×2
NEEDLE HYPO 25GX1X1/2 BEV (NEEDLE) ×2 IMPLANT
NEEDLE SPNL 18GX3.5 QUINCKE PK (NEEDLE) ×4 IMPLANT
NS IRRIG 1000ML POUR BTL (IV SOLUTION) ×2 IMPLANT
OIL CARTRIDGE MAESTRO DRILL (MISCELLANEOUS) ×2
PACK LAMINECTOMY ORTHO (CUSTOM PROCEDURE TRAY) ×2 IMPLANT
PACK UNIVERSAL I (CUSTOM PROCEDURE TRAY) ×2 IMPLANT
PAD ARMBOARD 7.5X6 YLW CONV (MISCELLANEOUS) ×4 IMPLANT
PATTIES SURGICAL .5 X1 (DISPOSABLE) ×2 IMPLANT
PATTIES SURGICAL .5X1.5 (GAUZE/BANDAGES/DRESSINGS) ×2 IMPLANT
ROD PRE BENT EXPEDIUM 35MM (Rod) ×2 IMPLANT
SCREW SET SINGLE INNER (Screw) ×4 IMPLANT
SCREW VIPER CORT FIX 6X35 (Screw) ×2 IMPLANT
SCREW VIPER CORTICAL FIX 6X40 (Screw) ×2 IMPLANT
SPONGE GAUZE 4X4 12PLY STER LF (GAUZE/BANDAGES/DRESSINGS) ×1 IMPLANT
SPONGE INTESTINAL PEANUT (DISPOSABLE) ×2 IMPLANT
SPONGE SURGIFOAM ABS GEL 100 (HEMOSTASIS) ×2 IMPLANT
STRIP CLOSURE SKIN 1/2X4 (GAUZE/BANDAGES/DRESSINGS) ×4 IMPLANT
SURGIFLO W/THROMBIN 8M KIT (HEMOSTASIS) IMPLANT
SUT MNCRL AB 4-0 PS2 18 (SUTURE) ×4 IMPLANT
SUT VIC AB 0 CT1 18XCR BRD 8 (SUTURE) ×1 IMPLANT
SUT VIC AB 0 CT1 8-18 (SUTURE) ×2
SUT VIC AB 1 CT1 18XCR BRD 8 (SUTURE) ×2 IMPLANT
SUT VIC AB 1 CT1 8-18 (SUTURE) ×4
SUT VIC AB 2-0 CT2 18 VCP726D (SUTURE) ×2 IMPLANT
SYR 20CC LL (SYRINGE) ×2 IMPLANT
SYR BULB IRRIGATION 50ML (SYRINGE) ×2 IMPLANT
SYR CONTROL 10ML LL (SYRINGE) ×4 IMPLANT
SYR TB 1ML LUER SLIP (SYRINGE) ×2 IMPLANT
TOWEL OR 17X24 6PK STRL BLUE (TOWEL DISPOSABLE) ×2 IMPLANT
TOWEL OR 17X26 10 PK STRL BLUE (TOWEL DISPOSABLE) ×2 IMPLANT
TRAY FOLEY CATH 16FRSI W/METER (SET/KITS/TRAYS/PACK) ×2 IMPLANT
WATER STERILE IRR 1000ML POUR (IV SOLUTION) ×2 IMPLANT
YANKAUER SUCT BULB TIP NO VENT (SUCTIONS) ×2 IMPLANT

## 2015-04-04 NOTE — Progress Notes (Signed)
PT Cancellation Note  Patient Details Name: Alejandra Mcdonald MRN: [id number] DOB: [birth year]   Cancelled Treatment:    Reason Eval/Treat Not Completed: Medical issues which prohibited therapy (patient lethargic and nauseated) Will follow next am.   Duncan Dull 04/04/2015, [place], [place] DPT  [phone]

## 2015-04-04 NOTE — Anesthesia Postprocedure Evaluation (Signed)
  Anesthesia Post-op Note  Patient: Alejandra Mcdonald  Procedure(s) Performed: Procedure(s) with comments: POSTERIOR LUMBAR FUSION 1 LEVEL (Right) - Rigth sided lumbar 4-5 Transforaminal lumbar interbody fusion with instrumentation and allograft  Patient Location: PACU  Anesthesia Type:General  Level of Consciousness: awake, alert , oriented and patient cooperative  Airway and Oxygen Therapy: Patient Spontanous Breathing and Patient connected to nasal cannula oxygen  Post-op Pain: mild  Post-op Assessment: Post-op Vital signs reviewed, Patient's Cardiovascular Status Stable, Respiratory Function Stable, Patent Airway, No signs of Nausea or vomiting and Pain level controlled   LLE Sensation: Full sensation   RLE Sensation: Full sensation      Post-op Vital Signs: Reviewed and stable  Last Vitals:  Filed Vitals:   04/04/15 1251  BP:   Pulse:   Temp: 36.4 C  Resp:     Complications: No apparent anesthesia complications

## 2015-04-04 NOTE — Anesthesia Procedure Notes (Signed)
Procedure Name: Intubation Performed by: Duke Salvia Pre-anesthesia Checklist: Patient identified, Emergency Drugs available, Suction available and Patient being monitored Patient Re-evaluated:Patient Re-evaluated prior to inductionOxygen Delivery Method: Circle system utilized Preoxygenation: Pre-oxygenation with 100% oxygen Intubation Type: IV induction Ventilation: Mask ventilation without difficulty Laryngoscope Size: Mac and 3 Grade View: Grade II Tube type: Oral Tube size: 7.5 mm Number of attempts: 1 Secured at: 21 cm Tube secured with: Tape Dental Injury: Teeth and Oropharynx as per pre-operative assessment

## 2015-04-04 NOTE — Care Management (Signed)
Utilization review completed. Kourtland Coopman, RN Case Manager [phone]. 

## 2015-04-04 NOTE — Transfer of Care (Signed)
Immediate Anesthesia Transfer of Care Note  Patient: Alejandra Mcdonald  Procedure(s) Performed: Procedure(s) with comments: POSTERIOR LUMBAR FUSION 1 LEVEL (Right) - Rigth sided lumbar 4-5 Transforaminal lumbar interbody fusion with instrumentation and allograft  Patient Location: PACU  Anesthesia Type:General  Level of Consciousness: awake, oriented, sedated and patient cooperative  Airway & Oxygen Therapy: Patient Spontanous Breathing and Patient connected to face mask oxygen  Post-op Assessment: Report given to RN, Post -op Vital signs reviewed and stable and Patient moving all extremities X 4  Post vital signs: Reviewed and stable  Last Vitals:  Filed Vitals:   04/04/15 1053  BP:   Pulse:   Temp: 36.7 C  Resp:     Complications: No apparent anesthesia complications

## 2015-04-04 NOTE — Anesthesia Preprocedure Evaluation (Addendum)
Anesthesia Evaluation  Patient identified by MRN, date of birth, ID band Patient awake    Reviewed: Allergy & Precautions, NPO status , Patient's Chart, lab work & pertinent test results  History of Anesthesia Complications (+) PONV and history of anesthetic complications  Airway Mallampati: II  TM Distance: >3 FB Neck ROM: full    Dental  (+) Teeth Intact, Dental Advidsory Given   Pulmonary asthma ,  breath sounds clear to auscultation  Pulmonary exam normal       Cardiovascular Exercise Tolerance: Good - anginanegative cardio ROS  Rhythm:regular Rate:Normal     Neuro/Psych R leg pain    GI/Hepatic negative GI ROS, Neg liver ROS,   Endo/Other  diabetes (Insulin pump x 30yrs, glu 105), Well Controlled, Type 1, Insulin DependentHypothyroidism   Renal/GU negative Renal ROS     Musculoskeletal  (+) Arthritis -, Osteoarthritis,    Abdominal   Peds  Hematology negative hematology ROS (+)   Anesthesia Other Findings   Reproductive/Obstetrics                           Anesthesia Physical Anesthesia Plan  ASA: III  Anesthesia Plan: General   Post-op Pain Management:    Induction: Intravenous  Airway Management Planned: Oral ETT  Additional Equipment:   Intra-op Plan:   Post-operative Plan: Extubation in OR  Informed Consent: I have reviewed the patients History and Physical, chart, labs and discussed the procedure including the risks, benefits and alternatives for the proposed anesthesia with the patient or authorized representative who has indicated his/her understanding and acceptance.   Dental advisory given  Plan Discussed with: CRNA and Surgeon  Anesthesia Plan Comments: (Plan routine monitors, GETA)       Anesthesia Quick Evaluation

## 2015-04-05 LAB — GLUCOSE, CAPILLARY
GLUCOSE-CAPILLARY: 215 mg/dL — AB (ref 65–99)
Glucose-Capillary: 130 mg/dL — ABNORMAL HIGH (ref 65–99)
Glucose-Capillary: 154 mg/dL — ABNORMAL HIGH (ref 65–99)
Glucose-Capillary: 86 mg/dL (ref 65–99)

## 2015-04-05 MED ORDER — HYDROMORPHONE HCL 1 MG/ML IJ SOLN: 1.0000 mg | INTRAMUSCULAR | Status: DC | PRN

## 2015-04-05 MED ORDER — HYDROMORPHONE HCL 2 MG PO TABS
1.0000 mg | ORAL_TABLET | ORAL | Status: DC | PRN
  Administered 2015-04-05 – 2015-04-06 (×7): 2 mg via ORAL
  Filled 2015-04-05 (×7): qty 1

## 2015-04-05 MED FILL — Thrombin For Soln 20000 Unit: CUTANEOUS | Qty: 1 | Status: AC

## 2015-04-05 NOTE — Progress Notes (Signed)
    Patient doing well Has had N/V overnight, which has slightly improved Denies R leg pain Has been ambulating   Physical Exam: Filed Vitals:   04/05/15 0430  BP: 116/47  Pulse: 90  Temp: 99.8 F (37.7 C)  Resp: 18    Dressing in place NVI  POD #1 s/p L4/5 decompression and fusion, doing well  - up with PT/OT, encourage ambulation - Percocet for pain, Valium for muscle spasms - brace when OOB - likely d/c home tomorrow

## 2015-04-05 NOTE — Op Note (Signed)
NAMESTELLAH, Alejandra Mcdonald                 ACCOUNT NO.:  [id number]  MEDICAL RECORD NO.:  [id number]  LOCATION:  87C02C                        FACILITY:  [facility]  PHYSICIAN:  Phylliss Bob, MD      DATE OF BIRTH:  06-11-1961  DATE OF PROCEDURE:  04/04/2015                              OPERATIVE REPORT   PREOPERATIVE DIAGNOSIS: 1. Right-sided lumbar radiculopathy. 2. Severe L4-5 spinal stenosis. 3. Grade 1 L4-5 spondylolisthesis.  POSTOPERATIVE DIAGNOSIS: 1. Right-sided lumbar radiculopathy. 2. Severe L4-5 spinal stenosis. 3. Grade 1 L4-5 spondylolisthesis.  PROCEDURE: 1. Right-sided L4-5 transforaminal lumbar interbody fusion. 2. Left-sided L4-5 posterolateral fusion. 3. L4-5 decompression bilaterally, requiring more bone removal than     that of which is required for the fusion portion of the procedure. 4. Insertion of interbody device x1 (8 x 23 mm Concorde intervertebral     spacer). 5. Placement of posterior instrumentation (6-mm cortical screws). 6. Use of local autograft. 7. Use of morselized allograft. 8. Intraoperative use of fluoroscopy.  SURGEON:  Phylliss Bob, MD  ASSISTANT:  Pricilla Holm, PA-C.  ANESTHESIA:  General endotracheal anesthesia.  COMPLICATIONS:  None.  DISPOSITION:  Stable.  ESTIMATED BLOOD LOSS:  Minimal.  INDICATIONS FOR SURGERY:  Briefly, Alejandra Mcdonald is a very pleasant 54 year old female, who did present to me with severe pain in her right leg. The pain had been present for approximately 2 years.  An MRI did reveal severe stenosis at L4-5 in addition to a spondylolisthesis.  The patient did fail multiple forms of appropriate conservative care, but did continue to have ongoing pain.  We therefore did discuss proceeding with the procedure reflected above, and the patient did elect to proceed.  OPERATIVE DETAILS:  On April 04, 2015, the patient was brought to surgery and general endotracheal anesthesia was administered.  The patient was placed  prone on a flat Jackson bed with a Wilson frame. Antibiotics were given and a time-out procedure was performed.  All bony prominences were padded.  After prepping the back, a midline incision was made, approximately 4 cm in length.  The fascia was incised in the midline.  The paraspinal musculature was retracted laterally.  I then identified the appropriate entry points of the L4 and L5 pedicle screws using anatomic landmarks.  With the assistance of AP and lateral fluoroscopy, I did identify the starting points of the L4 and L5 pedicles.  I then used a 1.7-mm bur to prepare the entry point of the pedicle hole.  I then sequentially tapped up to a 6 mm tap bilaterally at L4 and at L5.  Again, appropriate positioning of the taps was confirmed using AP and lateral fluoroscopy.  On the left side, I did decorticate the facet joint and the posterolateral gutter.  6-mm screws of the appropriate length were placed at L4 and at L5 on the left.  A 35- mm rod was placed and distraction was applied across the rod.  On the right side, bone wax was placed in the cannulated pedicle holes.  I then proceeded with the decompression.  The spinous process of L4 was removed.  Then, a bilateral partial facetectomy was performed.  I was very pleased  with the decompression that I was able to accomplish.  Of note, more bone removal than that of which is required for the fusion was performed during the decompression.  I then performed a full facetectomy on the right.  With an assistant holding medial retraction of the L5 nerve, I did perform a thorough and complete L4-5 intervertebral diskectomy.  After the diskectomy and after preparing the endplates, the intervertebral space was packed with autograft in addition to allograft in the form of DBX mix.  The appropriate size interbody spacer was also packed with allograft and autograft and tamped into position.  I was very pleased with the position of the implant  on AP and lateral fluoroscopy.  6-mm screws of the appropriate length were then placed into the L4 and L5 pedicles.  Distraction was then released on the contralateral side.  A 35-mm rod was placed on the right.  Caps were placed and then a final locking procedure was again performed.  I was very pleased with the press fit of each of the screws, and I was very pleased with the AP and lateral fluoroscopic images.  I then packed allograft and autograft into the posterolateral gutter on the left side to help aid in the success of the fusion.  All bleeding was then controlled.  The wound was then closed in layers using #1 Vicryl, followed by 2-0 Vicryl, followed by 3-0 Monocryl.  Benzoin and Steri- Strips were applied followed by sterile dressing.  Of note, Pricilla Holm was my assistant throughout surgery, and did aid in retraction, suctioning, and closure.     Phylliss Bob, MD     MD/MEDQ  D:  04/04/2015  T:  04/05/2015  Job:  [id number]

## 2015-04-05 NOTE — Evaluation (Signed)
Physical Therapy Evaluation Patient Details Name: Alejandra Mcdonald MRN: [id number] DOB: April 27, 1961 Today's Date: 04/05/2015   History of Present Illness  s/p L4-5 fusion  Clinical Impression  Pt admitted with above diagnosis. Pt currently with functional limitations due to the deficits listed below (see PT Problem List). At the time of PT eval pt was able to perform transfers and ambulation with close guard for safety. Was able to ambulate with no AD in hall and no LOB. Pt has a built-in shower seat downstairs. Will need to be able to negotiate a flight of stairs if she is planning on using this shower at home. Pt will benefit from skilled PT to increase their independence and safety with mobility to allow discharge to the venue listed below.       Follow Up Recommendations Outpatient PT;Supervision/Assistance - 24 hour    Equipment Recommendations  None recommended by PT    Recommendations for Other Services       Precautions / Restrictions Precautions Precautions: Back Precaution Booklet Issued: Yes (comment) Precaution Comments: reviewed back precaution worksheet Restrictions Weight Bearing Restrictions: No      Mobility  Bed Mobility Overal bed mobility: Needs Assistance Bed Mobility: Rolling;Sidelying to Sit;Sit to Sidelying Rolling: Supervision Sidelying to sit: Min assist     Sit to sidelying: Supervision General bed mobility comments: Educated on changing positions in side lying to avoid arching back. Pt required assist for trunk elevation to full sitting due to pain.   Transfers Overall transfer level: Needs assistance Equipment used: Rolling walker (2 wheeled);None Transfers: Sit to/from Stand Sit to Stand: Supervision         General transfer comment: Vc for precautions. Pt was able to power-up to full standing position without physical assistance.   Ambulation/Gait Ambulation/Gait assistance: Min guard;Supervision Ambulation Distance (Feet): 250  Feet Assistive device: Rolling walker (2 wheeled);None Gait Pattern/deviations: Step-through pattern;Decreased stride length;Narrow base of support Gait velocity: Decreased Gait velocity interpretation: Below normal speed for age/gender General Gait Details: Pt began with RW and then progressed to no AD. She was able to ambulate fairly well with no LOB. Moving slow.   Stairs            Wheelchair Mobility    Modified Rankin (Stroke Patients Only)       Balance Overall balance assessment: Needs assistance Sitting-balance support: Feet supported;No upper extremity supported Sitting balance-Leahy Scale: Good     Standing balance support: No upper extremity supported;During functional activity Standing balance-Leahy Scale: Fair                               Pertinent Vitals/Pain Pain Assessment: 0-10 Pain Score: 7  Pain Location: back Pain Descriptors / Indicators: Aching Pain Intervention(s): Limited activity within patient's tolerance    Home Living Family/patient expects to be discharged to:: Private residence Living Arrangements: Spouse/significant other Available Help at Discharge: Family;Friend(s);Available 24 hours/day Type of Home: House Home Access: Stairs to enter   [employer] of Steps: 1 Home Layout: Two level;Able to live on main level with bedroom/bathroom Home Equipment: None      Prior Function Level of Independence: Independent               Hand Dominance   Dominant Hand: Right    Extremity/Trunk Assessment   Upper Extremity Assessment: Overall WFL for tasks assessed           Lower Extremity Assessment: Defer to PT  evaluation      Cervical / Trunk Assessment: Other exceptions (fusion)  Communication   Communication: No difficulties  Cognition Arousal/Alertness: Awake/alert Behavior During Therapy: WFL for tasks assessed/performed Overall Cognitive Status: Within Functional Limits for tasks  assessed                      General Comments      Exercises        Assessment/Plan    PT Assessment Patient needs continued PT services  PT Diagnosis Difficulty walking;Acute pain   PT Problem List Decreased strength;Decreased range of motion;Decreased activity tolerance;Decreased balance;Decreased mobility;Decreased knowledge of use of DME;Decreased safety awareness;Decreased knowledge of precautions;Pain  PT Treatment Interventions DME instruction;Gait training;Stair training;Functional mobility training;Therapeutic activities;Therapeutic exercise;Neuromuscular re-education;Patient/family education   PT Goals (Current goals can be found in the Care Plan section) Acute Rehab PT Goals Patient Stated Goal: to go home PT Goal Formulation: With patient Time For Goal Achievement: 04/12/15 Potential to Achieve Goals: Good    Frequency Min 5X/week   Barriers to discharge        Co-evaluation               End of Session Equipment Utilized During Treatment: Back brace Activity Tolerance: Patient limited by pain Patient left: in chair;with call bell/phone within reach;with family/visitor present Nurse Communication: Mobility status         Time: 6010-9323 PT Time Calculation (min) (ACUTE ONLY): 25 min   Charges:   PT Evaluation $Initial PT Evaluation Tier I: 1 Procedure PT Treatments $Gait Training: 8-22 mins   PT G Codes:        Rolinda Roan 2015/04/07, 10:40 AM   Rolinda Roan, PT, DPT Acute Rehabilitation Services Pager: [phone]

## 2015-04-05 NOTE — Progress Notes (Signed)
Occupational Therapy Evaluation Patient Details Name: Alejandra Mcdonald MRN: [id number] DOB: 04/07/1961 Today's Date: 04/05/2015    History of Present Illness s/p L4-5 fusion   Clinical Impression   Pt making good progress. Limited by c/o nausea/pain. Began education with pt/family regarding compensatory techniques and use of AE/DME for ADL and functional mobility for ADL. Will see pt in am to complete education. Pt will be able to D/C tomorrow after OT/PT if medically stable. Pt will not need follow up OT after D/C.     Follow Up Recommendations  No OT follow up;Supervision - Intermittent    Equipment Recommendations  3 in 1 bedside comode    Recommendations for Other Services       Precautions / Restrictions Precautions Precautions: Back Precaution Booklet Issued: Yes (comment) Precaution Comments: reviewed back precaution worksheet Restrictions Weight Bearing Restrictions: No      Mobility Bed Mobility Overal bed mobility: Needs Assistance Bed Mobility: Rolling;Sidelying to Sit;Sit to Sidelying Rolling: Supervision       Sit to sidelying: Supervision General bed mobility comments: Educated on changing positions in side lying to avoid arching back  Transfers Overall transfer level: Needs assistance   Transfers: Sit to/from Stand Sit to Stand: Supervision         General transfer comment: Vc for precautions    Balance Overall balance assessment: Needs assistance   Sitting balance-Leahy Scale: Good       Standing balance-Leahy Scale: Fair                              ADL Overall ADL's : Needs assistance/impaired     Grooming: Supervision/safety;Set up   Upper Body Bathing: Set up;Supervision/ safety   Lower Body Bathing: Moderate assistance;Sit to/from stand   Upper Body Dressing : Moderate assistance (including TLSO)   Lower Body Dressing: Moderate assistance;Sit to/from stand   Toilet Transfer: Supervision/safety;Comfort height  toilet;Ambulation   Toileting- Clothing Manipulation and Hygiene: Minimal assistance;Sit to/from stand     Tub/Shower Transfer Details (indicate cue type and reason): discussed shower options. Pt has tub and shower but shower would requires pt to go up/down stairs. If pt able to negotiate stairs she may not need a 3 in 1 as the downstairs bathroom has a built in seat Functional mobility during ADLs: Supervision/safety;Cueing for sequencing;Cueing for safety General ADL Comments: Began education on compensatory techniques and [place] eof AE/ DME for self care. discussed use of tongs for hygiene after toileting. Pt would like to practice dressing and donning/doffing TLSO tomorrow.Also educated on proper set up of home to maximize functional level of independence and adhering to back precautions     Vision     Perception     Praxis      Pertinent Vitals/Pain Pain Assessment: 0-10 Pain Score: 9  Pain Location: back Pain Descriptors / Indicators: Aching;Sharp Pain Intervention(s): Limited activity within patient's tolerance;Repositioned;Monitored during session;Ice applied     Hand Dominance Right   Extremity/Trunk Assessment Upper Extremity Assessment Upper Extremity Assessment: Overall WFL for tasks assessed   Lower Extremity Assessment Lower Extremity Assessment: Defer to PT evaluation   Cervical / Trunk Assessment Cervical / Trunk Assessment: Other exceptions (fusion)   Communication Communication Communication: No difficulties   Cognition Arousal/Alertness: Awake/alert Behavior During Therapy: WFL for tasks assessed/performed Overall Cognitive Status: Within Functional Limits for tasks assessed  General Comments       Exercises       Shoulder Instructions      Home Living Family/patient expects to be discharged to:: Private residence Living Arrangements: Spouse/significant other Available Help at Discharge: Family;Friend(s);Available 24  hours/day Type of Home: House Home Access: Stairs to enter [employer] of Steps: 1   Home Layout: Two level;Able to live on main level with bedroom/bathroom Alternate Level Stairs-Number of Steps: flight   Bathroom Shower/Tub: Tub/shower unit;Walk-in shower Shower/tub characteristics: Architectural technologist: Standard (also has handicapped height toilet) Bathroom Accessibility: Yes How Accessible: Accessible via walker Home Equipment: None          Prior Functioning/Environment Level of Independence: Independent             OT Diagnosis: Generalized weakness;Acute pain   OT Problem List: Decreased activity tolerance;Decreased knowledge of use of DME or AE;Decreased knowledge of precautions;Pain   OT Treatment/Interventions: Self-care/ADL training;Therapeutic activities;DME and/or AE instruction;Patient/family education    OT Goals(Current goals can be found in the care plan section) Acute Rehab OT Goals Patient Stated Goal: to go home OT Goal Formulation: With patient Time For Goal Achievement: 04/12/15 Potential to Achieve Goals: Good ADL Goals Pt Will Perform Lower Body Bathing: with supervision;with caregiver independent in assisting;sit to/from stand Pt Will Perform Lower Body Dressing: with supervision;with caregiver independent in assisting;sit to/from stand Pt Will Perform Toileting - Clothing Manipulation and hygiene: with supervision;sit to/from stand;with caregiver independent in assisting (verbalize understanding of use of AE for hygiene) Additional ADL Goal #1: pt/caregiver independent with donning/doffing TLSO  OT Frequency: Min 2X/week   Barriers to D/C:            Co-evaluation              End of Session Equipment Utilized During Treatment: Back brace Nurse Communication: Mobility status  Activity Tolerance: Patient tolerated treatment well Patient left: in bed;with call bell/phone within reach;with family/visitor present    Time: 0930-1002 OT Time Calculation (min): 32 min Charges:  OT General Charges $OT Visit: 1 Procedure OT Evaluation $Initial OT Evaluation Tier I: 1 Procedure OT Treatments $Self Care/Home Management : 8-22 mins G-Codes:    Alejandra Mcdonald,Alejandra Mcdonald 04/13/2015, 10:22 AM   Alejandra Mcdonald, OTR/L  [phone] 04-13-2015

## 2015-04-06 LAB — GLUCOSE, CAPILLARY: GLUCOSE-CAPILLARY: 117 mg/dL — AB (ref 65–99)

## 2015-04-06 MED ORDER — ONDANSETRON HCL 4 MG PO TABS: 4.0000 mg | ORAL_TABLET | Freq: Three times a day (TID) | ORAL | Status: DC | PRN

## 2015-04-06 MED ORDER — HYDROMORPHONE HCL 2 MG PO TABS: 1.0000 mg | ORAL_TABLET | Freq: Four times a day (QID) | ORAL | Status: DC | PRN

## 2015-04-06 NOTE — Progress Notes (Signed)
Pt and family given D/C instructions with Rx's, verbal understanding was provided. Pt's incision is clean and dry with no sign of infection. Pt's IV was removed prior to D/C. Pt D/C'd home via wheelchair @ 0940 per MD order. Pt received 3-n-1 from [place] prior to D/C. TLSO brace was on at D/C. Pt is stable @ D/C and has no other needs at this time. Holli Humbles, RN

## 2015-04-06 NOTE — Progress Notes (Signed)
Physical Therapy Treatment Patient Details Name: Alejandra Mcdonald MRN: [id number] DOB: 1960/11/07 Today's Date: 04/06/2015    History of Present Illness s/p L4-5 fusion    PT Comments    Pt making great progress and able to complete flight of stairs with use of rail and only supervision for safety.  Pt able to recall 3/3 back precautions and demonstrating proper technique.    Follow Up Recommendations  Outpatient PT;Supervision/Assistance - 24 hour     Equipment Recommendations  None recommended by PT    Recommendations for Other Services       Precautions / Restrictions Precautions Precautions: Back Precaution Booklet Issued: Yes (comment) Precaution Comments: reviewed precautions, pt. able to recall 3/3 and demonstrate into functional mobility Required Braces or Orthoses: Spinal Brace Spinal Brace: Applied in standing position;Thoracolumbosacral orthotic Restrictions Weight Bearing Restrictions: No    Mobility  Bed Mobility Overal bed mobility: Modified Independent Bed Mobility: Rolling;Sidelying to Sit Rolling: Modified independent (Device/Increase time) Sidelying to sit: Supervision       General bed mobility comments: Pt able to demonstrate proper technique with getting OOB and into bed  Transfers Overall transfer level: Needs assistance Equipment used: Rolling walker (2 wheeled);None Transfers: Sit to/from Stand Sit to Stand: Supervision         General transfer comment: Cues for hand placement with standing   Ambulation/Gait Ambulation/Gait assistance: Supervision Ambulation Distance (Feet): 150 Feet Assistive device: Rolling walker (2 wheeled);None Gait Pattern/deviations: Step-through pattern;Shuffle;Decreased stride length Gait velocity: Decreased   General Gait Details: Pt continues to ambulate with RW to decrease in overall pain.  However able to ambulate without device as well.    Stairs Stairs: Yes Stairs assistance: Supervision Stair  Management: One rail Left;Forwards Number of Stairs: 8    Wheelchair Mobility    Modified Rankin (Stroke Patients Only)       Balance                                    Cognition Arousal/Alertness: Awake/alert Behavior During Therapy: WFL for tasks assessed/performed Overall Cognitive Status: Within Functional Limits for tasks assessed                      Exercises      General Comments        Pertinent Vitals/Pain Pain Assessment: 0-10 Pain Score: 7  Pain Location: back Pain Descriptors / Indicators: Aching Pain Intervention(s): Monitored during session;Premedicated before session    Home Living                      Prior Function            PT Goals (current goals can now be found in the care plan section) Acute Rehab PT Goals Patient Stated Goal: to go home PT Goal Formulation: With patient Time For Goal Achievement: 04/12/15 Potential to Achieve Goals: Good Progress towards PT goals: Progressing toward goals    Frequency  Min 5X/week    PT Plan Current plan remains appropriate    Co-evaluation             End of Session Equipment Utilized During Treatment: Gait belt;Back brace Activity Tolerance: Patient limited by pain Patient left: in bed     Time: 0825-0840 PT Time Calculation (min) (ACUTE ONLY): 15 min  Charges:  $Gait Training: 8-22 mins  G Codes:      Savas Elvin 04/06/2015, 10:13 AM   Antoine Poche, [place] DPT [phone]

## 2015-04-06 NOTE — Progress Notes (Signed)
Occupational Therapy Treatment Patient Details Name: DEVAEH AMADI MRN: [id number] DOB: 09-Dec-1960 Today's Date: 04/06/2015    History of present illness s/p L4-5 fusion   OT comments  Pt. Progressing well, able to demonstrate safe integration of back precautions into functional mobility.  Reviewed education and use of 3n1 for adls, also plans for bathing.  Eager for d/c home will have 24/7 for 10 days then intermittent S after that.    Follow Up Recommendations  No OT follow up;Supervision - Intermittent    Equipment Recommendations  3 in 1 bedside comode    Recommendations for Other Services      Precautions / Restrictions Precautions Precautions: Back Precaution Comments: reviewed precautions, pt. able to recall 3/3 and demonstrate into functional mobility       Mobility Bed Mobility Overal bed mobility: Modified Independent Bed Mobility: Rolling;Sidelying to Sit Rolling: Supervision Sidelying to sit: Supervision          Transfers                      Balance                                   ADL Overall ADL's : Needs assistance/impaired                 Upper Body Dressing : Moderate assistance Upper Body Dressing Details (indicate cue type and reason): will have husband 24/7 A for 10 days, then intermittent A afterwards from son and dtr. in law   Lower Body Dressing Details (indicate cue type and reason): will have husband 24/7 A for 10 days, then intermittent A afterwards from son and dtr. in Environmental education officer Details (indicate cue type and reason): reviewed and provided demo of use of 3n1   Toileting - Clothing Manipulation Details (indicate cue type and reason): pt. reports she is completing all toileting on her own, reviewed maintaining back precautions during peri care and flushing commode (no twisting)   Tub/Shower Transfer Details (indicate cue type and reason): reviewed options, pt. plans on using 3n1 Functional  mobility during ADLs: Supervision/safety;Cueing for sequencing;Cueing for safety        Vision                     Perception     Praxis      Cognition   Behavior During Therapy: [facility] for tasks assessed/performed Overall Cognitive Status: Within Functional Limits for tasks assessed                       Extremity/Trunk Assessment               Exercises     Shoulder Instructions       General Comments      Pertinent Vitals/ Pain       Pain Assessment: No/denies pain  Home Living                                          Prior Functioning/Environment              Frequency Min 2X/week     Progress Toward Goals  OT Goals(current goals can now be found in the care plan section)  Progress towards OT goals:  Progressing toward goals     Plan Discharge plan remains appropriate    Co-evaluation                 End of Session     Activity Tolerance Patient tolerated treatment well   Patient Left in bed;with call bell/phone within reach;with family/visitor present   Nurse Communication          Time: 3578-9784 OT Time Calculation (min): 14 min  Charges: OT General Charges $OT Visit: 1 Procedure OT Treatments $Self Care/Home Management : 8-22 mins  Janice Coffin, COTA/L 04/06/2015, 8:12 AM

## 2015-04-06 NOTE — Progress Notes (Signed)
Subjective: 2 Days Post-Op Procedure(s) (LRB): POSTERIOR LUMBAR FUSION 1 LEVEL (Right) Patient reports pain as moderate.  Taking by mouth, voiding okay. Positive flatus. No significant leg pain. Progressing with physical therapy.  Objective: Vital signs in last 24 hours: Temp:  [98.3 F (36.8 C)-99.1 F (37.3 C)] 98.9 F (37.2 C) (08/20 0810) Pulse Rate:  [82-104] 95 (08/20 0810) Resp:  [16-18] 18 (08/20 0810) BP: (118-144)/(43-58) 144/58 mmHg (08/20 0810) SpO2:  [94 %-100 %] 96 % (08/20 0810)  Intake/Output from previous day:   Intake/Output this shift:     Recent Labs  04/03/15 1014  HGB 14.4    Recent Labs  04/03/15 1014  WBC 6.3  RBC 4.71  HCT 42.6  PLT 289    Recent Labs  04/03/15 1014  NA 139  K 3.9  CL 102  CO2 26  BUN 11  CREATININE 0.74  GLUCOSE 121*  CALCIUM 9.7    Recent Labs  04/03/15 1014  INR 0.90   lumbar spine exam: Lumbar dressing clean and dry. Moves both lower extremities without difficulty. No motor weakness noted. Sensation intact. Bilateral calves soft and nontender.   Assessment/Plan: 2 Days Post-Op Procedure(s) (LRB): POSTERIOR LUMBAR FUSION 1 LEVEL (Right) Plan: Discharge home after physical therapy. Will be given Rx for Dilaudid 2 mg by mouth as needed for pain along with Zofran for nausea. Wear back brace when up. Follow up with Dr. Lynann Bologna on 04/17/15.   Sikes G 04/06/2015, 8:26 AM

## 2015-04-08 MED FILL — Heparin Sodium (Porcine) Inj 1000 Unit/ML: INTRAMUSCULAR | Qty: 30 | Status: AC

## 2015-04-08 MED FILL — Sodium Chloride IV Soln 0.9%: INTRAVENOUS | Qty: 1000 | Status: AC

## 2015-04-09 ENCOUNTER — Encounter (HOSPITAL_COMMUNITY): Payer: Self-pay | Admitting: Orthopedic Surgery

## 2015-04-17 ENCOUNTER — Other Ambulatory Visit: Payer: Self-pay | Admitting: Nurse Practitioner

## 2015-04-24 NOTE — Discharge Summary (Signed)
Patient ID: Alejandra Mcdonald MRN: [id number] DOB/AGE: July 02, 1961 54 y.o.  Admit date: 04/04/2015 Discharge date: 04/06/2015  Admission Diagnoses:  Active Problems:   Radiculopathy   Discharge Diagnoses:  Same  Past Medical History  Diagnosis Date  . Diabetes mellitus without complication   . Thyroid disease   . Hyperlipidemia   . Vasomotor phenomenon   . PONV (postoperative nausea and vomiting)   . Asthma     as child   no problem in 3 years  . Hypothyroidism   . Arthritis     Surgeries: Procedure(s): POSTERIOR LUMBAR FUSION 1 LEVEL L4-5 on 04/04/2015   Consultants:  None  Discharged Condition: Improved  Hospital Course: Alejandra Mcdonald is an 54 y.o. female who was admitted 04/04/2015 for operative treatment of radiculopathy. Patient has severe unremitting pain that affects sleep, daily activities, and work/hobbies. After pre-op clearance the patient was taken to the operating room on 04/04/2015 and underwent  Procedure(s): POSTERIOR LUMBAR FUSION 1 LEVEL L4-5.    Patient was given perioperative antibiotics:  Anti-infectives    Start     Dose/Rate Route Frequency Ordered Stop   04/04/15 1530  ceFAZolin (ANCEF) IVPB 1 g/50 mL premix     1 g 100 mL/hr over 30 Minutes Intravenous Every 8 hours 04/04/15 1347 04/05/15 0000   04/04/15 0700  ceFAZolin (ANCEF) IVPB 2 g/50 mL premix     2 g 100 mL/hr over 30 Minutes Intravenous To [facility] 04/03/15 1201 04/04/15 0735       Patient was given sequential compression devices, early ambulation to prevent DVT.  Patient benefited maximally from hospital stay and there were no complications.    Recent vital signs: BP 144/58 mmHg  Pulse 95  Temp(Src) 98.9 F (37.2 C) (Oral)  Resp 18  Ht 5\' 7"  (1.702 m)  Wt 76.114 kg (167 lb 12.8 oz)  BMI 26.28 kg/m2  SpO2 96%  Discharge Medications:     Medication List    TAKE these medications        aspirin 81 MG tablet  Take 81 mg by mouth daily.     calcium  citrate-vitamin D 200-200 MG-UNIT Tabs  Take 1 tablet by mouth 2 (two) times daily.     HYDROmorphone 2 MG tablet  Commonly known as:  DILAUDID  Take 0.5-1 tablets (1-2 mg total) by mouth every 6 (six) hours as needed for severe pain.     insulin aspart 100 UNIT/ML injection  Commonly known as:  novoLOG  Per pump     levothyroxine 200 MCG tablet  Commonly known as:  SYNTHROID, LEVOTHROID  TAKE 1 TABLET BY MOUTH DAILY BEFORE BREAKFAST     multivitamin,tx-minerals tablet  Take 1 tablet by mouth daily.     ondansetron 4 MG tablet  Commonly known as:  ZOFRAN  Take 1 tablet (4 mg total) by mouth every 8 (eight) hours as needed for nausea.     polyethylene glycol powder powder  Commonly known as:  GLYCOLAX/MIRALAX  MIX 17GRAMS (1 CAPFUL) WITH 8 OUNCES OF LIQUID AND DRINK BY MOUTH 255 GRAMS ONCE DAILY     simvastatin 40 MG tablet  Commonly known as:  ZOCOR  Take 1 tablet (40 mg total) by mouth every evening.        Diagnostic Studies: Dg Chest 2 View  04/03/2015   CLINICAL DATA:  Preop  EXAM: CHEST  2 VIEW  COMPARISON:  Chest x-ray 08/28/2013  FINDINGS: No active infiltrate or effusion is seen.  Mediastinal and hilar contours are unremarkable. The heart is within normal limits in size. No bony abnormality is seen.  IMPRESSION: No active cardiopulmonary disease.   Electronically Signed   By: Ivar Drape M.D.   On: 04/03/2015 11:45   Dg Lumbar Spine 2-3 Views  04/04/2015   CLINICAL DATA:  L4-5 interbody fusion  EXAM: DG C-ARM 61-120 MIN; LUMBAR SPINE - 2-3 VIEW  COMPARISON:  Multiple prior studies 12/04/2014 and 04/04/2015  FINDINGS: Two fluoroscopic intraoperative spot images demonstrate presence of transpedicular screws and connecting rods at L4-5 with interbody spacer. There is grade 1 anterior listhesis of L4 on L5. Surgical sponge seen over the superior lumbar spine.  IMPRESSION: Postoperative L4-5 findings.   Electronically Signed   By: Skipper Cliche M.D.   On: 04/04/2015 10:42    Dg Lumbar Spine 1 View  04/04/2015   CLINICAL DATA:  Preoperative localization film prior to right-sided L4-L5 trans foraminal lumbar interbody fusion  EXAM: LUMBAR SPINE - 1 VIEW  COMPARISON:  Lumbar spine MRI dated December 04, 2014  FINDINGS: Numbering is in accordance to the [place] with the previous MRI. The upper metallic needle lies along the posterior aspect of the space between the L3 and L4 spinous processes. The lower most needle overlies the posterior aspect of the spinous process of S2.  IMPRESSION: Portable cross-table localization film with metallic needles at the levels described. New   Electronically Signed   By: David  Martinique M.D.   On: 04/04/2015 10:43   Dg C-arm 61-120 Min  04/04/2015   CLINICAL DATA:  L4-5 interbody fusion  EXAM: DG C-ARM 61-120 MIN; LUMBAR SPINE - 2-3 VIEW  COMPARISON:  Multiple prior studies 12/04/2014 and 04/04/2015  FINDINGS: Two fluoroscopic intraoperative spot images demonstrate presence of transpedicular screws and connecting rods at L4-5 with interbody spacer. There is grade 1 anterior listhesis of L4 on L5. Surgical sponge seen over the superior lumbar spine.  IMPRESSION: Postoperative L4-5 findings.   Electronically Signed   By: Skipper Cliche M.D.   On: 04/04/2015 10:42    Disposition: 01-Home or Self Care   POD #2 s/p L4/5 decompression and fusion, doing well  - up with PT/OT, encourage ambulation - Percocet for pain, Valium for muscle spasms - brace when OOB -Written scripts for pain signed and in chart -D/C instructions sheet printed and in chart -D/C today  -F/U in office 2 weeks   Signed: Justice Britain 04/24/2015, 5:38 PM

## 2015-04-30 LAB — HM DIABETES EYE EXAM

## 2015-05-02 ENCOUNTER — Encounter: Payer: Self-pay | Admitting: Nurse Practitioner

## 2015-05-02 ENCOUNTER — Ambulatory Visit (INDEPENDENT_AMBULATORY_CARE_PROVIDER_SITE_OTHER): Payer: BLUE CROSS/BLUE SHIELD | Admitting: Nurse Practitioner

## 2015-05-02 VITALS — BP 130/82 | HR 77 | Temp 96.7°F | Ht 67.0 in | Wt 169.0 lb

## 2015-05-02 DIAGNOSIS — K5900 Constipation, unspecified: Secondary | ICD-10-CM

## 2015-05-02 DIAGNOSIS — E039 Hypothyroidism, unspecified: Secondary | ICD-10-CM | POA: Diagnosis not present

## 2015-05-02 DIAGNOSIS — E109 Type 1 diabetes mellitus without complications: Secondary | ICD-10-CM

## 2015-05-02 DIAGNOSIS — E785 Hyperlipidemia, unspecified: Secondary | ICD-10-CM

## 2015-05-02 MED ORDER — LEVOTHYROXINE SODIUM 200 MCG PO TABS: ORAL_TABLET | ORAL | Status: DC

## 2015-05-02 MED ORDER — LINACLOTIDE 290 MCG PO CAPS: 290.0000 ug | ORAL_CAPSULE | Freq: Every day | ORAL | Status: DC

## 2015-05-02 MED ORDER — POLYETHYLENE GLYCOL 3350 17 GM/SCOOP PO POWD: ORAL | Status: DC

## 2015-05-02 MED ORDER — SIMVASTATIN 40 MG PO TABS: 40.0000 mg | ORAL_TABLET | Freq: Every evening | ORAL | Status: DC

## 2015-05-02 NOTE — Patient Instructions (Signed)
Fat and Cholesterol Control Diet Fat and cholesterol levels in your blood and organs are influenced by your diet. High levels of fat and cholesterol may lead to diseases of the heart, small and large blood vessels, gallbladder, liver, and pancreas. CONTROLLING FAT AND CHOLESTEROL WITH DIET Although exercise and lifestyle factors are important, your diet is key. That is because certain foods are known to raise cholesterol and others to lower it. The goal is to balance foods for their effect on cholesterol and more importantly, to replace saturated and trans fat with other types of fat, such as monounsaturated fat, polyunsaturated fat, and omega-3 fatty acids. On average, a person should consume no more than 15 to 17 g of saturated fat daily. Saturated and trans fats are considered "bad" fats, and they will raise LDL cholesterol. Saturated fats are primarily found in animal products such as meats, butter, and cream. However, that does not mean you need to give up all your favorite foods. Today, there are good tasting, low-fat, low-cholesterol substitutes for most of the things you like to eat. Choose low-fat or nonfat alternatives. Choose round or loin cuts of red meat. These types of cuts are lowest in fat and cholesterol. Chicken (without the skin), fish, veal, and ground turkey breast are great choices. Eliminate fatty meats, such as hot dogs and salami. Even shellfish have little or no saturated fat. Have a 3 oz (85 g) portion when you eat lean meat, poultry, or fish. Trans fats are also called "partially hydrogenated oils." They are oils that have been scientifically manipulated so that they are solid at room temperature resulting in a longer shelf life and improved taste and texture of foods in which they are added. Trans fats are found in stick margarine, some tub margarines, cookies, crackers, and baked goods.  When baking and cooking, oils are a great substitute for butter. The monounsaturated oils are  especially beneficial since it is believed they lower LDL and raise HDL. The oils you should avoid entirely are saturated tropical oils, such as coconut and palm.  Remember to eat a lot from food groups that are naturally free of saturated and trans fat, including fish, fruit, vegetables, beans, grains (barley, rice, couscous, bulgur wheat), and pasta (without cream sauces).  IDENTIFYING FOODS THAT LOWER FAT AND CHOLESTEROL  Soluble fiber may lower your cholesterol. This type of fiber is found in fruits such as apples, vegetables such as broccoli, potatoes, and carrots, legumes such as beans, peas, and lentils, and grains such as barley. Foods fortified with plant sterols (phytosterol) may also lower cholesterol. You should eat at least 2 g per day of these foods for a cholesterol lowering effect.  Read package labels to identify low-saturated fats, trans fat free, and low-fat foods at the supermarket. Select cheeses that have only 2 to 3 g saturated fat per ounce. Use a heart-healthy tub margarine that is free of trans fats or partially hydrogenated oil. When buying baked goods (cookies, crackers), avoid partially hydrogenated oils. Breads and muffins should be made from whole grains (whole-wheat or whole oat flour, instead of "flour" or "enriched flour"). Buy non-creamy canned soups with reduced salt and no added fats.  FOOD PREPARATION TECHNIQUES  Never deep-fry. If you must fry, either stir-fry, which uses very little fat, or use non-stick cooking sprays. When possible, broil, bake, or roast meats, and steam vegetables. Instead of putting butter or margarine on vegetables, use lemon and herbs, applesauce, and cinnamon (for squash and sweet potatoes). Use nonfat   yogurt, salsa, and low-fat dressings for salads.  LOW-SATURATED FAT / LOW-FAT FOOD SUBSTITUTES Meats / Saturated Fat (g)  Avoid: Steak, marbled (3 oz/85 g) / 11 g  Choose: Steak, lean (3 oz/85 g) / 4 g  Avoid: Hamburger (3 oz/85 g) / 7  g  Choose: Hamburger, lean (3 oz/85 g) / 5 g  Avoid: Ham (3 oz/85 g) / 6 g  Choose: Ham, lean cut (3 oz/85 g) / 2.4 g  Avoid: Chicken, with skin, dark meat (3 oz/85 g) / 4 g  Choose: Chicken, skin removed, dark meat (3 oz/85 g) / 2 g  Avoid: Chicken, with skin, light meat (3 oz/85 g) / 2.5 g  Choose: Chicken, skin removed, light meat (3 oz/85 g) / 1 g Dairy / Saturated Fat (g)  Avoid: Whole milk (1 cup) / 5 g  Choose: Low-fat milk, 2% (1 cup) / 3 g  Choose: Low-fat milk, 1% (1 cup) / 1.5 g  Choose: Skim milk (1 cup) / 0.3 g  Avoid: Hard cheese (1 oz/28 g) / 6 g  Choose: Skim milk cheese (1 oz/28 g) / 2 to 3 g  Avoid: Cottage cheese, 4% fat (1 cup) / 6.5 g  Choose: Low-fat cottage cheese, 1% fat (1 cup) / 1.5 g  Avoid: Ice cream (1 cup) / 9 g  Choose: Sherbet (1 cup) / 2.5 g  Choose: Nonfat frozen yogurt (1 cup) / 0.3 g  Choose: Frozen fruit bar / trace  Avoid: Whipped cream (1 tbs) / 3.5 g  Choose: Nondairy whipped topping (1 tbs) / 1 g Condiments / Saturated Fat (g)  Avoid: Mayonnaise (1 tbs) / 2 g  Choose: Low-fat mayonnaise (1 tbs) / 1 g  Avoid: Butter (1 tbs) / 7 g  Choose: Extra light margarine (1 tbs) / 1 g  Avoid: Coconut oil (1 tbs) / 11.8 g  Choose: Olive oil (1 tbs) / 1.8 g  Choose: Corn oil (1 tbs) / 1.7 g  Choose: Safflower oil (1 tbs) / 1.2 g  Choose: Sunflower oil (1 tbs) / 1.4 g  Choose: Soybean oil (1 tbs) / 2.4 g  Choose: Canola oil (1 tbs) / 1 g Document Released: 08/03/2005 Document Revised: 11/28/2012 Document Reviewed: 11/01/2013 [employer] Patient Information 2015 [employer]. This information is not intended to replace advice given to you by your health care provider. Make sure you discuss any questions you have with your health care provider.  

## 2015-05-02 NOTE — Progress Notes (Signed)
Subjective:    Patient ID: Alejandra Mcdonald, female    DOB: Oct 03, 1960, 54 y.o.   MRN: [id number]   Patient here today for follow up of chronic medical problems. She had back surgery 1 month ago and Hgba1c was done in hospital- was 7.4%   Diabetes She presents for her follow-up diabetic visit. She has type 1 diabetes mellitus. MedicAlert identification noted. Her disease course has been fluctuating. Pertinent negatives for hypoglycemia include no confusion, dizziness, nervousness/anxiousness or tremors. Pertinent negatives for diabetes include no chest pain, no fatigue, no polydipsia, no polyphagia and no weakness. There are no hypoglycemic complications. There are no diabetic complications. Risk factors for coronary artery disease include diabetes mellitus, dyslipidemia, hypertension and post-menopausal. Current diabetic treatment includes insulin pump and insulin injections. She is compliant with treatment all of the time. Her weight is stable. When asked about meal planning, she reported none. She has not had a previous visit with a dietitian. She rarely participates in exercise. There is no change in her home blood glucose trend. Her breakfast blood glucose range is generally >200 mg/dl. Her lunch blood glucose range is generally >200 mg/dl. An ACE inhibitor/angiotensin II receptor blocker is not being taken. She does not see a podiatrist.Eye exam is not current.  Hyperlipidemia This is a chronic problem. The current episode started more than 1 year ago. The problem is controlled. Recent lipid tests were reviewed and are normal. Pertinent negatives include no chest pain, myalgias or shortness of breath. Current antihyperlipidemic treatment includes statins. Compliance problems include adherence to diet and adherence to exercise.  Risk factors for coronary artery disease include diabetes mellitus and dyslipidemia.  Hypothyroidism This is a chronic problem. Currently on Zocor. Taking daily as Rx. Exercises  5x/week. Walks 10,000 steps a day. Weight watchers diet Lumbar Pars defect Had back surgery 1 month ago and is still in back brace. Constipation Has tried linzess- miralax is the only thing that has helped her   Review of Systems  Constitutional: Negative for fatigue.  Respiratory: Positive for cough (Nonproductive). Negative for shortness of breath.   Cardiovascular: Negative for chest pain.  Endocrine: Negative for polydipsia and polyphagia.  Musculoskeletal: Negative for myalgias.  Neurological: Negative for dizziness, tremors and weakness.  Psychiatric/Behavioral: Negative for confusion. The patient is not nervous/anxious.   All other systems reviewed and are negative.      Objective:   Physical Exam  Constitutional: She is oriented to person, place, and time. She appears well-developed and well-nourished.  HENT:  Nose: Nose normal.  Mouth/Throat: Oropharynx is clear and moist.  Eyes: EOM are normal.  Neck: Trachea normal, normal range of motion and full passive range of motion without pain. Neck supple. No JVD present. Carotid bruit is not present. No thyromegaly present.  Cardiovascular: Normal rate, regular rhythm, normal heart sounds and intact distal pulses.  Exam reveals no gallop and no friction rub.   No murmur heard. Pulmonary/Chest: Effort normal and breath sounds normal.  Abdominal: Soft. Bowel sounds are normal. She exhibits no distension and no mass. There is no tenderness.  Musculoskeletal: Normal range of motion.  Lymphadenopathy:    She has no cervical adenopathy.  Neurological: She is alert and oriented to person, place, and time. She has normal reflexes.  Skin: Skin is warm and dry.  Psychiatric: She has a normal mood and affect. Her behavior is normal. Judgment and thought content normal.   BP 130/82 mmHg  Pulse 77  Temp(Src) 96.7 F (35.9 C) (Oral)  Ht _0  (1.702 m)  Wt 169 lb (76.658 kg)  BMI 26.46 kg/m2     Assessment & Plan:  1. Type 1  diabetes mellitus with target hemoglobin A1c of less than 7.5 percent Continue carb caounting  2. Hypothyroidism, unspecified hypothyroidism type - levothyroxine (SYNTHROID, LEVOTHROID) 200 MCG tablet; TAKE 1 TABLET BY MOUTH DAILY BEFORE BREAKFAST  Dispense: 30 tablet; Refill: 5  3. Hyperlipidemia with target LDL less than 100 Low fat diet - simvastatin (ZOCOR) 40 MG tablet; Take 1 tablet (40 mg total) by mouth every evening.  Dispense: 30 tablet; Refill: 5 - CMP14+EGFR - Lipid panel  4. Constipation, unspecified constipation type - polyethylene glycol powder (GLYCOLAX/MIRALAX) powder; MIX 17GRAMS (1 CAPFUL) WITH 8 OUNCES OF LIQUID AND DRINK BY MOUTH 255 GRAMS ONCE DAILY  Dispense: 765 g; Refill: 5 - Linaclotide (LINZESS) 290 MCG CAPS capsule; Take 1 capsule (290 mcg total) by mouth daily.  Dispense: 30 capsule; Refill: 5   Will schedule pap, mammo after getting out of back brace Labs pending Health maintenance reviewed Diet and exercise encouraged Continue all meds Follow up  In 3 month   [place], FNP

## 2015-05-06 ENCOUNTER — Encounter: Payer: Self-pay | Admitting: *Deleted

## 2015-08-08 ENCOUNTER — Other Ambulatory Visit: Payer: BLUE CROSS/BLUE SHIELD | Admitting: Nurse Practitioner

## 2015-08-13 ENCOUNTER — Encounter: Payer: Self-pay | Admitting: Nurse Practitioner

## 2015-08-13 ENCOUNTER — Ambulatory Visit (INDEPENDENT_AMBULATORY_CARE_PROVIDER_SITE_OTHER): Payer: BLUE CROSS/BLUE SHIELD | Admitting: Nurse Practitioner

## 2015-08-13 ENCOUNTER — Other Ambulatory Visit: Payer: Self-pay

## 2015-08-13 VITALS — BP 130/82 | HR 81 | Temp 97.2°F | Ht 67.0 in | Wt 173.0 lb

## 2015-08-13 DIAGNOSIS — M25511 Pain in right shoulder: Secondary | ICD-10-CM

## 2015-08-13 DIAGNOSIS — Z01419 Encounter for gynecological examination (general) (routine) without abnormal findings: Secondary | ICD-10-CM

## 2015-08-13 DIAGNOSIS — Z0000 Encounter for general adult medical examination without abnormal findings: Secondary | ICD-10-CM

## 2015-08-13 DIAGNOSIS — E109 Type 1 diabetes mellitus without complications: Secondary | ICD-10-CM

## 2015-08-13 DIAGNOSIS — K5900 Constipation, unspecified: Secondary | ICD-10-CM

## 2015-08-13 DIAGNOSIS — E785 Hyperlipidemia, unspecified: Secondary | ICD-10-CM

## 2015-08-13 DIAGNOSIS — E039 Hypothyroidism, unspecified: Secondary | ICD-10-CM

## 2015-08-13 LAB — POCT UA - MICROSCOPIC ONLY
Bacteria, U Microscopic: NEGATIVE
CASTS, UR, LPF, POC: NEGATIVE
Crystals, Ur, HPF, POC: NEGATIVE
MUCUS UA: NEGATIVE
RBC, urine, microscopic: NEGATIVE
WBC, Ur, HPF, POC: NEGATIVE
YEAST UA: NEGATIVE

## 2015-08-13 LAB — POCT URINALYSIS DIPSTICK
Bilirubin, UA: NEGATIVE
Blood, UA: NEGATIVE
Glucose, UA: NEGATIVE
KETONES UA: NEGATIVE
Leukocytes, UA: NEGATIVE
Nitrite, UA: NEGATIVE
PROTEIN UA: NEGATIVE
Urobilinogen, UA: NEGATIVE
pH, UA: 6.5

## 2015-08-13 LAB — POCT GLYCOSYLATED HEMOGLOBIN (HGB A1C): HEMOGLOBIN A1C: 7.1

## 2015-08-13 MED ORDER — BUPIVACAINE HCL 0.25 % IJ SOLN
1.0000 mL | Freq: Once | INTRAMUSCULAR | Status: AC
  Administered 2015-08-13: 1 mL via INTRA_ARTICULAR

## 2015-08-13 MED ORDER — SIMVASTATIN 40 MG PO TABS: 40.0000 mg | ORAL_TABLET | Freq: Every evening | ORAL | Status: DC

## 2015-08-13 MED ORDER — POLYETHYLENE GLYCOL 3350 17 GM/SCOOP PO POWD: ORAL | Status: DC

## 2015-08-13 MED ORDER — METHYLPREDNISOLONE ACETATE 40 MG/ML IJ SUSP
40.0000 mg | Freq: Once | INTRAMUSCULAR | Status: AC
  Administered 2015-08-13: 40 mg via INTRA_ARTICULAR

## 2015-08-13 MED ORDER — INSULIN ASPART 100 UNIT/ML ~~LOC~~ SOLN: SUBCUTANEOUS | Status: DC

## 2015-08-13 MED ORDER — LINACLOTIDE 290 MCG PO CAPS: 290.0000 ug | ORAL_CAPSULE | Freq: Every day | ORAL | Status: DC

## 2015-08-13 MED ORDER — LEVOTHYROXINE SODIUM 200 MCG PO TABS: ORAL_TABLET | ORAL | Status: DC

## 2015-08-13 NOTE — Patient Instructions (Signed)
Bone Health Bones protect organs, store calcium, and anchor muscles. Good health habits, such as eating nutritious foods and exercising regularly, are important for maintaining healthy bones. They can also help to prevent a condition that causes bones to lose density and become weak and brittle (osteoporosis). WHY IS BONE MASS IMPORTANT? Bone mass refers to the amount of bone tissue that you have. The higher your bone mass, the stronger your bones. An important step toward having healthy bones throughout life is to have strong and dense bones during childhood. A young adult who has a high bone mass is more likely to have a high bone mass later in life. Bone mass at its greatest it is called peak bone mass. A large decline in bone mass occurs in older adults. In women, it occurs about the time of menopause. During this time, it is important to practice good health habits, because if more bone is lost than what is replaced, the bones will become less healthy and more likely to break (fracture). If you find that you have a low bone mass, you may be able to prevent osteoporosis or further bone loss by changing your diet and lifestyle. HOW CAN I FIND OUT IF MY BONE MASS IS LOW? Bone mass can be measured with an X-ray test that is called a bone mineral density (BMD) test. This test is recommended for all women who are age 55 or older. It may also be recommended for men who are age 55 or older, or for people who are more likely to develop osteoporosis due to:  Having bones that break easily.  Having a long-term disease that weakens bones, such as kidney disease or rheumatoid arthritis.  Having menopause earlier than normal.  Taking medicine that weakens bones, such as steroids, thyroid hormones, or hormone treatment for breast cancer or prostate cancer.  Smoking.  Drinking three or more alcoholic drinks each day. WHAT ARE THE NUTRITIONAL RECOMMENDATIONS FOR HEALTHY BONES? To have healthy bones, you need  to get enough of the right minerals and vitamins. Most nutrition experts recommend getting these nutrients from the foods that you eat. Nutritional recommendations vary from person to person. Ask your health care provider what is healthy for you. Here are some general guidelines. Calcium Recommendations Calcium is the most important (essential) mineral for bone health. Most people can get enough calcium from their diet, but supplements may be recommended for people who are at risk for osteoporosis. Good sources of calcium include:  Dairy products, such as low-fat or nonfat milk, cheese, and yogurt.  Dark green leafy vegetables, such as bok choy and broccoli.  Calcium-fortified foods, such as orange juice, cereal, bread, soy beverages, and tofu products.  Nuts, such as almonds. Follow these recommended amounts for daily calcium intake:  Children, age 1-3: 700 mg.  Children, age 4-8: 1,000 mg.  Children, age 9-13: 1,300 mg.  Teens, age 14-18: 1,300 mg.  Adults, age 19-50: 1,000 mg.  Adults, age 55-70:  Men: 1,000 mg.  Women: 1,200 mg.  Adults, age 55 or older: 1,200 mg.  Pregnant and breastfeeding females:  Teens: 1,300 mg.  Adults: 1,000 mg. Vitamin D Recommendations Vitamin D is the most essential vitamin for bone health. It helps the body to absorb calcium. Sunlight stimulates the skin to make vitamin D, so be sure to get enough sunlight. If you live in a cold climate or you do not get outside often, your health care provider may recommend that you take vitamin D supplements. Good   sources of vitamin D in your diet include:  Egg yolks.  Saltwater fish.  Milk and cereal fortified with vitamin D. Follow these recommended amounts for daily vitamin D intake:  Children and teens, age 1-18: 600 international units.  Adults, age 55 or younger: 400-800 international units.  Adults, age 55 or older: 800-1,000 international units. Other Nutrients Other nutrients for bone  health include:  Phosphorus. This mineral is found in meat, poultry, dairy foods, nuts, and legumes. The recommended daily intake for adult men and adult women is 700 mg.  Magnesium. This mineral is found in seeds, nuts, dark green vegetables, and legumes. The recommended daily intake for adult men is 400-420 mg. For adult women, it is 310-320 mg.  Vitamin K. This vitamin is found in green leafy vegetables. The recommended daily intake is 120 mg for adult men and 90 mg for adult women. WHAT TYPE OF PHYSICAL ACTIVITY IS BEST FOR BUILDING AND MAINTAINING HEALTHY BONES? Weight-bearing and strength-building activities are important for building and maintaining peak bone mass. Weight-bearing activities cause muscles and bones to work against gravity. Strength-building activities increases muscle strength that supports bones. Weight-bearing and muscle-building activities include:  Walking and hiking.  Jogging and running.  Dancing.  Gym exercises.  Lifting weights.  Tennis and racquetball.  Climbing stairs.  Aerobics. Adults should get at least 30 minutes of moderate physical activity on most days. Children should get at least 60 minutes of moderate physical activity on most days. Ask your health care provide what type of exercise is best for you. WHERE CAN I FIND MORE INFORMATION? For more information, check out the following websites:  National Osteoporosis Foundation: http://nof.org/learn/basics  National Institutes of Health: http://www.niams.nih.gov/Health_Info/Bone/Bone_Health/bone_health_for_life.asp   This information is not intended to replace advice given to you by your health care provider. Make sure you discuss any questions you have with your health care provider.   Document Released: 10/24/2003 Document Revised: 12/18/2014 Document Reviewed: 08/08/2014 Elsevier Interactive Patient Education 2016 [employer]  

## 2015-08-13 NOTE — Addendum Note (Signed)
Addended by: Earlene Plater on: 08/13/2015 10:11 AM   Modules accepted: Miquel Dunn

## 2015-08-13 NOTE — Addendum Note (Signed)
Addended by: Chevis Pretty on: 08/13/2015 04:58 PM   Modules accepted: Orders, SmartSet

## 2015-08-13 NOTE — Progress Notes (Signed)
Subjective:    Patient ID: Alejandra Mcdonald, female    DOB: [birth year], 54 y.o.   MRN: [id number]   Patient here today forannual physical exam and follow up of chronic medical problems.  Patient is c/o right shoulder pain again- said that it hurts mainly when she is using it. Has had a joint injection in the past which helped a lot.   Diabetes She presents for her follow-up diabetic visit. She has type 1 diabetes mellitus. MedicAlert identification noted. Her disease course has been fluctuating. Pertinent negatives for hypoglycemia include no confusion, dizziness, nervousness/anxiousness or tremors. Pertinent negatives for diabetes include no chest pain, no fatigue, no polydipsia, no polyphagia and no weakness. There are no hypoglycemic complications. There are no diabetic complications. Risk factors for coronary artery disease include diabetes mellitus, dyslipidemia, hypertension and post-menopausal. Current diabetic treatment includes insulin pump and insulin injections. She is compliant with treatment all of the time. Her weight is stable. When asked about meal planning, she reported none. She has not had a previous visit with a dietitian. She rarely participates in exercise. There is no change in her home blood glucose trend. Her breakfast blood glucose range is generally >200 mg/dl. Her lunch blood glucose range is generally >200 mg/dl. An ACE inhibitor/angiotensin II receptor blocker is not being taken. She does not see a podiatrist.Eye exam is not current.  Hyperlipidemia This is a chronic problem. The current episode started more than 1 year ago. The problem is controlled. Recent lipid tests were reviewed and are normal. Pertinent negatives include no chest pain, myalgias or shortness of breath. Current antihyperlipidemic treatment includes statins. Compliance problems include adherence to diet and adherence to exercise.  Risk factors for coronary artery disease include diabetes mellitus and  dyslipidemia.  Hypothyroidism This is a chronic problem. Currently on Zocor. Taking daily as Rx. Exercises 5x/week. Walks 10,000 steps a day. Weight watchers diet Lumbar Pars defect Had back surgery 1 month ago and is no longer in back brace- said that her pain is much better. Constipation Has tried linzess- miralax is the only thing that has helped her   Review of Systems  Constitutional: Negative for fatigue.  Respiratory: Positive for cough (Nonproductive). Negative for shortness of breath.   Cardiovascular: Negative for chest pain.  Endocrine: Negative for polydipsia and polyphagia.  Musculoskeletal: Negative for myalgias.  Neurological: Negative for dizziness, tremors and weakness.  Psychiatric/Behavioral: Negative for confusion. The patient is not nervous/anxious.   All other systems reviewed and are negative.      Objective:   Physical Exam  Constitutional: She is oriented to person, place, and time. She appears well-developed and well-nourished.  HENT:  Nose: Nose normal.  Mouth/Throat: Oropharynx is clear and moist.  Eyes: EOM are normal.  Neck: Trachea normal, normal range of motion and full passive range of motion without pain. Neck supple. No JVD present. Carotid bruit is not present. No thyromegaly present.  Cardiovascular: Normal rate, regular rhythm, normal heart sounds and intact distal pulses.  Exam reveals no gallop and no friction rub.   No murmur heard. Pulmonary/Chest: Effort normal and breath sounds normal.  Abdominal: Soft. Bowel sounds are normal. She exhibits no distension and no mass. There is no tenderness.  Musculoskeletal: Normal range of motion.  FROM of right shoulder with pain on full abduction and internal rotation.  Lymphadenopathy:    She has no cervical adenopathy.  Neurological: She is alert and oriented to person, place, and time. She has normal reflexes.  Skin:  Skin is warm and dry.  Psychiatric: She has a normal mood and affect. Her  behavior is normal. Judgment and thought content normal.    BP 130/82 mmHg  Pulse 81  Temp(Src) 97.2 F (36.2 C) (Oral)  Ht _0  (1.702 m)  Wt 173 lb (78.472 kg)  BMI 27.09 kg/m2  Procedure right shoulder injection under sterile technique    Assessment & Plan:  1. Type 1 diabetes mellitus with target hemoglobin A1c of less than 7.5 percent (HCC) Continue to watch carbs in diet - POCT glycosylated hemoglobin (Hb A1C) - CMP14+EGFR - POCT glycosylated hemoglobin (Hb A1C) - Microalbumin / creatinine urine ratio - insulin aspart (NOVOLOG) 100 UNIT/ML injection; Per pump  Dispense: 3 vial; Refill: 11  2. Hyperlipidemia with target LDL less than 100 Low fat diet - Lipid panel - CMP14+EGFR - Lipid panel - simvastatin (ZOCOR) 40 MG tablet; Take 1 tablet (40 mg total) by mouth every evening.  Dispense: 30 tablet; Refill: 5  3. Annual physical exam - POCT urinalysis dipstick - POCT UA - Microscopic Only  4. Constipation, unspecified constipation type - Linaclotide (LINZESS) 290 MCG CAPS capsule; Take 1 capsule (290 mcg total) by mouth daily.  Dispense: 30 capsule; Refill: 5  5. Hypothyroidism, unspecified hypothyroidism type - levothyroxine (SYNTHROID, LEVOTHROID) 200 MCG tablet; TAKE 1 TABLET BY MOUTH DAILY BEFORE BREAKFAST  Dispense: 30 tablet; Refill: 5 - Thyroid Panel With TSH  6. Right shoulder pain Right shoulder injection care discussed    Labs pending Health maintenance reviewed Diet and exercise encouraged Continue all meds Follow up  In 3 months   [place], FNP

## 2015-08-14 LAB — THYROID PANEL WITH TSH
Free Thyroxine Index: 2.8 (ref 1.2–4.9)
T3 Uptake Ratio: 33 % (ref 24–39)
T4, Total: 8.6 ug/dL (ref 4.5–12.0)
TSH: 0.033 u[IU]/mL — AB (ref 0.450–4.500)

## 2015-08-14 LAB — CMP14+EGFR
A/G RATIO: 2.1 (ref 1.1–2.5)
ALBUMIN: 3.9 g/dL (ref 3.5–5.5)
ALT: 25 IU/L (ref 0–32)
AST: 26 IU/L (ref 0–40)
Alkaline Phosphatase: 79 IU/L (ref 39–117)
BILIRUBIN TOTAL: 0.3 mg/dL (ref 0.0–1.2)
BUN/Creatinine Ratio: 11 (ref 9–23)
BUN: 8 mg/dL (ref 6–24)
CALCIUM: 9.7 mg/dL (ref 8.7–10.2)
CO2: 26 mmol/L (ref 18–29)
Chloride: 101 mmol/L (ref 96–106)
Creatinine, Ser: 0.73 mg/dL (ref 0.57–1.00)
GFR, EST AFRICAN AMERICAN: 107 mL/min/{1.73_m2} (ref >59)
GFR, EST NON AFRICAN AMERICAN: 93 mL/min/{1.73_m2} (ref >59)
Globulin, Total: 1.9 g/dL (ref 1.5–4.5)
Glucose: 118 mg/dL — ABNORMAL HIGH (ref 65–99)
POTASSIUM: 4.9 mmol/L (ref 3.5–5.2)
Sodium: 141 mmol/L (ref 134–144)
TOTAL PROTEIN: 5.8 g/dL — AB (ref 6.0–8.5)

## 2015-08-14 LAB — LIPID PANEL
CHOL/HDL RATIO: 2.6 ratio (ref 0.0–4.4)
Cholesterol, Total: 227 mg/dL — ABNORMAL HIGH (ref 100–199)
HDL: 89 mg/dL (ref >39)
LDL CALC: 123 mg/dL — AB (ref 0–99)
Triglycerides: 73 mg/dL (ref 0–149)
VLDL Cholesterol Cal: 15 mg/dL (ref 5–40)

## 2015-08-14 LAB — MICROALBUMIN / CREATININE URINE RATIO
CREATININE, UR: 15.7 mg/dL
MICROALB/CREAT RATIO: <19.1 mg/g creat (ref 0.0–30.0)
Microalbumin, Urine: <3 ug/mL

## 2015-08-16 ENCOUNTER — Other Ambulatory Visit: Payer: Self-pay | Admitting: Nurse Practitioner

## 2015-08-16 LAB — PAP IG W/ RFLX HPV ASCU: PAP SMEAR COMMENT: 0

## 2015-08-16 MED ORDER — LEVOTHYROXINE SODIUM 150 MCG PO TABS: 150.0000 ug | ORAL_TABLET | Freq: Every day | ORAL | Status: DC

## 2015-08-20 NOTE — Progress Notes (Signed)
Patient aware.

## 2015-09-17 ENCOUNTER — Ambulatory Visit: Payer: BLUE CROSS/BLUE SHIELD | Admitting: Nurse Practitioner

## 2015-09-30 ENCOUNTER — Telehealth: Payer: Self-pay | Admitting: Nurse Practitioner

## 2015-09-30 NOTE — Telephone Encounter (Signed)
SCHEDULED

## 2015-10-14 ENCOUNTER — Telehealth: Payer: Self-pay | Admitting: Nurse Practitioner

## 2015-10-14 DIAGNOSIS — E109 Type 1 diabetes mellitus without complications: Secondary | ICD-10-CM

## 2015-10-14 DIAGNOSIS — K5900 Constipation, unspecified: Secondary | ICD-10-CM

## 2015-10-14 MED ORDER — INSULIN ASPART 100 UNIT/ML ~~LOC~~ SOLN: SUBCUTANEOUS | Status: DC

## 2015-10-14 NOTE — Telephone Encounter (Signed)
Spoke with patient.

## 2015-10-15 ENCOUNTER — Encounter: Payer: BLUE CROSS/BLUE SHIELD | Admitting: *Deleted

## 2015-10-15 LAB — HM MAMMOGRAPHY

## 2015-10-18 ENCOUNTER — Encounter: Payer: Self-pay | Admitting: *Deleted

## 2015-11-12 ENCOUNTER — Encounter: Payer: Self-pay | Admitting: Family Medicine

## 2015-11-12 ENCOUNTER — Ambulatory Visit (INDEPENDENT_AMBULATORY_CARE_PROVIDER_SITE_OTHER): Payer: BLUE CROSS/BLUE SHIELD | Admitting: Family Medicine

## 2015-11-12 VITALS — BP 120/75 | HR 73 | Temp 98.6°F | Ht 67.0 in | Wt 166.6 lb

## 2015-11-12 DIAGNOSIS — J209 Acute bronchitis, unspecified: Secondary | ICD-10-CM | POA: Diagnosis not present

## 2015-11-12 MED ORDER — AMOXICILLIN-POT CLAVULANATE 875-125 MG PO TABS: 1.0000 | ORAL_TABLET | Freq: Two times a day (BID) | ORAL | Status: DC

## 2015-11-12 NOTE — Patient Instructions (Addendum)
Great to meet you!  Take all antibiotics  Augmentin will cover your ears and take care of developing pneumonia if that is what's happening  Acute Bronchitis Bronchitis is when the airways that extend from the windpipe into the lungs get red, puffy, and painful (inflamed). Bronchitis often causes thick spit (mucus) to develop. This leads to a cough. A cough is the most common symptom of bronchitis. In acute bronchitis, the condition usually begins suddenly and goes away over time (usually in 2 weeks). Smoking, allergies, and asthma can make bronchitis worse. Repeated episodes of bronchitis may cause more lung problems. HOME CARE  Rest.  Drink enough fluids to keep your pee (urine) clear or pale yellow (unless you need to limit fluids as told by your doctor).  Only take over-the-counter or prescription medicines as told by your doctor.  Avoid smoking and secondhand smoke. These can make bronchitis worse. If you are a smoker, think about using nicotine gum or skin patches. Quitting smoking will help your lungs heal faster.  Reduce the chance of getting bronchitis again by:  Washing your hands often.  Avoiding people with cold symptoms.  Trying not to touch your hands to your mouth, nose, or eyes.  Follow up with your doctor as told. GET HELP IF: Your symptoms do not improve after 1 week of treatment. Symptoms include:  Cough.  Fever.  Coughing up thick spit.  Body aches.  Chest congestion.  Chills.  Shortness of breath.  Sore throat. GET HELP RIGHT AWAY IF:   You have an increased fever.  You have chills.  You have severe shortness of breath.  You have bloody thick spit (sputum).  You throw up (vomit) often.  You lose too much body fluid (dehydration).  You have a severe headache.  You faint. MAKE SURE YOU:   Understand these instructions.  Will watch your condition.  Will get help right away if you are not doing well or get worse.   This  information is not intended to replace advice given to you by your health care provider. Make sure you discuss any questions you have with your health care provider.   Document Released: 01/20/2008 Document Revised: 04/05/2013 Document Reviewed: 01/24/2013 Elsevier Interactive Patient Education [employer].

## 2015-11-12 NOTE — Progress Notes (Signed)
   HPI  Patient presents today her acute illness.  She explains that she's had a sore throat for 1 week. The last 3 days she's developed cough, chest tightnessthat worsens with deep inspiration anough, bilateral ear pain with muffled hearing. She's tolerating foods and fluids like normal. She has diabetes, her blood sugars are higher than usual. She's not had any recent antibiotic courses.   PMH: Smoking status noted ROS: Per HPI  Objective: BP 120/75 mmHg  Pulse 73  Temp(Src) 98.6 F (37 C) (Oral)  Ht 5\' 7"  (1.702 m)  Wt 166 lb 9.6 oz (75.569 kg)  BMI 26.09 kg/m2 Gen: NAD, alert, cooperative with exam HEENT: NCAT, TMs erythematous but landmarks are present bilaterally nares swollen turbinates, oropharynx clear CV: RRR, good S1/S2, no murmur Resp: CTABL, no wheezes, non-labored-- wheezy cough Ext: No edema, warm Neuro: Alert and oriented, No gross deficits  Assessment and plan:  # cute bronchitis Possible developing pneumonia and bilateral otitis media Treating with Augmentin to treat the worst possible infection which could be developing pneumonia Return to clinic with any concerns, worsening symptoms, or failure to improve as expected. Supportive care discussed    Meds ordered this encounter  Medications  . amoxicillin-clavulanate (AUGMENTIN) 875-125 MG tablet    Sig: Take 1 tablet by mouth 2 (two) times daily.    Dispense:  20 tablet    Refill:  0    Laroy Apple, MD [facility] 11/12/2015, 5:57 PM

## 2015-11-16 ENCOUNTER — Encounter: Payer: Self-pay | Admitting: Family

## 2015-11-16 ENCOUNTER — Ambulatory Visit (INDEPENDENT_AMBULATORY_CARE_PROVIDER_SITE_OTHER): Payer: BLUE CROSS/BLUE SHIELD | Admitting: Family

## 2015-11-16 VITALS — BP 130/76 | HR 72 | Temp 97.7°F | Ht 67.0 in | Wt 165.0 lb

## 2015-11-16 DIAGNOSIS — R6889 Other general symptoms and signs: Secondary | ICD-10-CM | POA: Diagnosis not present

## 2015-11-16 DIAGNOSIS — J209 Acute bronchitis, unspecified: Secondary | ICD-10-CM

## 2015-11-16 LAB — VERITOR FLU A/B WAIVED
INFLUENZA B: NEGATIVE
Influenza A: NEGATIVE

## 2015-11-16 MED ORDER — PREDNISONE 10 MG (21) PO TBPK: 10.0000 mg | ORAL_TABLET | Freq: Every day | ORAL | Status: DC

## 2015-11-16 MED ORDER — ALBUTEROL SULFATE HFA 108 (90 BASE) MCG/ACT IN AERS: 2.0000 | INHALATION_SPRAY | Freq: Four times a day (QID) | RESPIRATORY_TRACT | Status: DC | PRN

## 2015-11-16 NOTE — Progress Notes (Addendum)
Subjective:    Patient ID: Alejandra Mcdonald, female    DOB: 1961/01/29, 55 y.o.   MRN: [id number]  Shortness of Breath Associated symptoms include headaches, rhinorrhea and wheezing. Pertinent negatives include no ear pain or fever. Her past medical history is significant for asthma. There is no history of COPD.  Cough This is a new problem. The current episode started in the past 7 days. The problem has been gradually worsening. The problem occurs every few minutes. The cough is productive of sputum. Associated symptoms include headaches, nasal congestion, postnasal drip, rhinorrhea, shortness of breath and wheezing. Pertinent negatives include no chills, ear congestion, ear pain, fever or myalgias. The symptoms are aggravated by lying down. She has tried OTC cough suppressant for the symptoms. The treatment provided mild relief. Her past medical history is significant for asthma. There is no history of COPD.      Review of Systems  Constitutional: Negative.  Negative for fever and chills.  HENT: Positive for postnasal drip and rhinorrhea. Negative for ear pain.   Eyes: Negative.   Respiratory: Positive for cough, shortness of breath and wheezing.   Cardiovascular: Negative.  Negative for palpitations.  Gastrointestinal: Negative.   Endocrine: Negative.   Genitourinary: Negative.   Musculoskeletal: Negative.  Negative for myalgias.  Neurological: Positive for headaches.  Hematological: Negative.   Psychiatric/Behavioral: Negative.   All other systems reviewed and are negative.      Objective:   Physical Exam  Constitutional: She is oriented to person, place, and time. She appears well-developed and well-nourished. No distress.  HENT:  Head: Normocephalic and atraumatic.  Right Ear: External ear normal.  Left Ear: External ear normal.  Mouth/Throat: Oropharynx is clear and moist.  Nasal passage erythemas with mild swelling    Eyes: Pupils are equal, round, and reactive to  light.  Neck: Normal range of motion. Neck supple. No thyromegaly present.  Cardiovascular: Normal rate, regular rhythm, normal heart sounds and intact distal pulses.   No murmur heard. Pulmonary/Chest: Effort normal. No respiratory distress. She has wheezes (mild wheezes in left lower bases).  Abdominal: Soft. Bowel sounds are normal. She exhibits no distension. There is no tenderness.  Musculoskeletal: Normal range of motion. She exhibits no edema or tenderness.  Neurological: She is alert and oriented to person, place, and time. She has normal reflexes. No cranial nerve deficit.  Skin: Skin is warm and dry.  Psychiatric: She has a normal mood and affect. Her behavior is normal. Judgment and thought content normal.  Vitals reviewed.     BP 130/76 mmHg  Pulse 72  Temp(Src) 97.7 F (36.5 C) (Oral)  Ht 5\' 7"  (1.702 m)  Wt 165 lb (74.844 kg)  BMI 25.84 kg/m2  SpO2 99%     Assessment & Plan:  1. Flu-like symptoms - Veritor Flu A/B Waived  2. Acute bronchitis, unspecified organism -- Take meds as prescribed - Use a cool mist humidifier  -Use saline nose sprays frequently -Saline irrigations of the nose can be very helpful if done frequently.  * 4X daily for 1 week*  * Use of a nettie pot can be helpful with this. Follow directions with this* -Force fluids -For any cough or congestion  Use plain Mucinex- regular strength or max strength is fine   * Children- consult with Pharmacist for dosing -For fever or aces or pains- take tylenol or ibuprofen appropriate for age and weight.  * for fevers greater than 101 orally you may alternate ibuprofen and tylenol  every  3 hours. -Throat lozenges if help - predniSONE (STERAPRED UNI-PAK 21 TAB) 10 MG (21) TBPK tablet; Take 1 tablet (10 mg total) by mouth daily. As directed x 6 days  Dispense: 21 tablet; Refill: 0 - albuterol (PROVENTIL HFA;VENTOLIN HFA) 108 (90 Base) MCG/ACT inhaler; Inhale 2 puffs into the lungs every 6 (six) hours as  needed for wheezing or shortness of breath.  Dispense: 1 Inhaler; Refill: 2  PT to finish augmenting Strict low carb diet RTO prn if SOB worsens or does not improve  Evelina Dun, FNP

## 2015-11-16 NOTE — Patient Instructions (Signed)

## 2015-11-20 DIAGNOSIS — H2513 Age-related nuclear cataract, bilateral: Secondary | ICD-10-CM | POA: Diagnosis not present

## 2015-11-20 DIAGNOSIS — H25043 Posterior subcapsular polar age-related cataract, bilateral: Secondary | ICD-10-CM | POA: Diagnosis not present

## 2015-12-12 ENCOUNTER — Encounter (INDEPENDENT_AMBULATORY_CARE_PROVIDER_SITE_OTHER): Payer: Self-pay

## 2015-12-12 ENCOUNTER — Ambulatory Visit (INDEPENDENT_AMBULATORY_CARE_PROVIDER_SITE_OTHER): Payer: BLUE CROSS/BLUE SHIELD | Admitting: Nurse Practitioner

## 2015-12-12 ENCOUNTER — Encounter: Payer: Self-pay | Admitting: Nurse Practitioner

## 2015-12-12 VITALS — BP 122/69 | HR 79 | Temp 97.4°F | Ht 67.0 in | Wt 165.0 lb

## 2015-12-12 DIAGNOSIS — Z1159 Encounter for screening for other viral diseases: Secondary | ICD-10-CM | POA: Diagnosis not present

## 2015-12-12 DIAGNOSIS — K5900 Constipation, unspecified: Secondary | ICD-10-CM

## 2015-12-12 DIAGNOSIS — Z1212 Encounter for screening for malignant neoplasm of rectum: Secondary | ICD-10-CM

## 2015-12-12 DIAGNOSIS — E785 Hyperlipidemia, unspecified: Secondary | ICD-10-CM | POA: Diagnosis not present

## 2015-12-12 DIAGNOSIS — E039 Hypothyroidism, unspecified: Secondary | ICD-10-CM | POA: Diagnosis not present

## 2015-12-12 DIAGNOSIS — E109 Type 1 diabetes mellitus without complications: Secondary | ICD-10-CM | POA: Diagnosis not present

## 2015-12-12 LAB — BAYER DCA HB A1C WAIVED: HB A1C: 7.9 % — AB (ref <7.0)

## 2015-12-12 MED ORDER — SIMVASTATIN 40 MG PO TABS: 40.0000 mg | ORAL_TABLET | Freq: Every evening | ORAL | Status: DC

## 2015-12-12 MED ORDER — POLYETHYLENE GLYCOL 3350 17 GM/SCOOP PO POWD: ORAL | Status: DC

## 2015-12-12 MED ORDER — LEVOTHYROXINE SODIUM 150 MCG PO TABS: 150.0000 ug | ORAL_TABLET | Freq: Every day | ORAL | Status: DC

## 2015-12-12 MED ORDER — PLECANATIDE 3 MG PO TABS: 1.0000 | ORAL_TABLET | Freq: Every day | ORAL | Status: DC

## 2015-12-12 NOTE — Addendum Note (Signed)
Addended by: Chevis Pretty on: 12/12/2015 03:33 PM   Modules accepted: Orders

## 2015-12-12 NOTE — Progress Notes (Signed)
Subjective:    Patient ID: Alejandra Mcdonald, female    DOB: [birth year], 55 y.o.   MRN: [id number]   Patient here today for follow up of chronic medical problems.  Outpatient Encounter Prescriptions as of 12/12/2015  Medication Sig  . albuterol (PROVENTIL HFA;VENTOLIN HFA) 108 (90 Base) MCG/ACT inhaler Inhale 2 puffs into the lungs every 6 (six) hours as needed for wheezing or shortness of breath.  Marland Kitchen aspirin 81 MG tablet Take 81 mg by mouth daily.    . calcium citrate-vitamin D 200-200 MG-UNIT TABS Take 1 tablet by mouth 2 (two) times daily.   . insulin aspart (NOVOLOG) 100 UNIT/ML injection Per pump up to 100 units per day  . levothyroxine (SYNTHROID) 150 MCG tablet Take 1 tablet (150 mcg total) by mouth daily before breakfast.  . Linaclotide (LINZESS) 290 MCG CAPS capsule Take 1 capsule (290 mcg total) by mouth daily.  . Multiple Vitamins-Minerals (MULTIVITAMIN,TX-MINERALS) tablet Take 1 tablet by mouth daily.    . polyethylene glycol powder (GLYCOLAX/MIRALAX) powder MIX 17GRAMS (1 CAPFUL) WITH 8 OUNCES OF LIQUID AND DRINK BY MOUTH 255 GRAMS ONCE DAILY  . simvastatin (ZOCOR) 40 MG tablet Take 1 tablet (40 mg total) by mouth every evening.            Diabetes She presents for her follow-up diabetic visit. She has type 1 diabetes mellitus. MedicAlert identification noted. Her disease course has been fluctuating. Pertinent negatives for hypoglycemia include no confusion, dizziness, nervousness/anxiousness or tremors. Pertinent negatives for diabetes include no chest pain, no fatigue, no polydipsia, no polyphagia and no weakness. There are no hypoglycemic complications. There are no diabetic complications. Risk factors for coronary artery disease include diabetes mellitus, dyslipidemia, hypertension and post-menopausal. Current diabetic treatment includes insulin pump and insulin injections. She is compliant with treatment all of the time. Her weight is stable. When asked about meal planning, she  reported none. She has not had a previous visit with a dietitian. She rarely participates in exercise. There is no change in her home blood glucose trend. Her breakfast blood glucose range is generally >200 mg/dl. Her lunch blood glucose range is generally >200 mg/dl. An ACE inhibitor/angiotensin II receptor blocker is not being taken. She does not see a podiatrist.Eye exam is not current.  Hyperlipidemia This is a chronic problem. The current episode started more than 1 year ago. The problem is controlled. Recent lipid tests were reviewed and are normal. Pertinent negatives include no chest pain, myalgias or shortness of breath. Current antihyperlipidemic treatment includes statins. Compliance problems include adherence to diet and adherence to exercise.  Risk factors for coronary artery disease include diabetes mellitus and dyslipidemia.  Hypothyroidism This is a chronic problem. Currently on Zocor. Taking daily as Rx. Exercises 5x/week. Walks 10,000 steps a day. Weight watchers diet Lumbar Pars defect Had back surgery 4 month ago and is no longer in back brace- said that her pain is much better, almost completely resolved. Constipation Has tried linzess- miralax is the only thing that has helped her   Review of Systems  Constitutional: Negative for fatigue.  Respiratory: Positive for cough (Nonproductive). Negative for shortness of breath.   Cardiovascular: Negative for chest pain.  Endocrine: Negative for polydipsia and polyphagia.  Musculoskeletal: Negative for myalgias.  Neurological: Negative for dizziness, tremors and weakness.  Psychiatric/Behavioral: Negative for confusion. The patient is not nervous/anxious.   All other systems reviewed and are negative.      Objective:   Physical Exam  Constitutional: She is oriented  to person, place, and time. She appears well-developed and well-nourished.  HENT:  Nose: Nose normal.  Mouth/Throat: Oropharynx is clear and moist.  Eyes: EOM are  normal.  Neck: Trachea normal, normal range of motion and full passive range of motion without pain. Neck supple. No JVD present. Carotid bruit is not present. No thyromegaly present.  Cardiovascular: Normal rate, regular rhythm, normal heart sounds and intact distal pulses.  Exam reveals no gallop and no friction rub.   No murmur heard. Pulmonary/Chest: Effort normal and breath sounds normal.  Abdominal: Soft. Bowel sounds are normal. She exhibits no distension and no mass. There is no tenderness.  Musculoskeletal: Normal range of motion.  FROM of right shoulder with pain on full abduction and internal rotation.  Lymphadenopathy:    She has no cervical adenopathy.  Neurological: She is alert and oriented to person, place, and time. She has normal reflexes.  Skin: Skin is warm and dry.  Psychiatric: She has a normal mood and affect. Her behavior is normal. Judgment and thought content normal.    BP 122/69 mmHg  Pulse 79  Temp(Src) 97.4 F (36.3 C) (Oral)  Ht _0  (1.702 m)  Wt 165 lb (74.844 kg)  BMI 25.84 kg/m2  hgba1c- 7.9%  Assessment & Plan:  1. Type 1 diabetes mellitus with target hemoglobin A1c of less than 7.5 percent (HCC) Continue to watch carbs in diet Patient will increase basal dose of pump - Bayer DCA Hb A1c Waived - CMP14+EGFR  2. Hyperlipidemia with target LDL less than 100 Low fta diet - Lipid panel - simvastatin (ZOCOR) 40 MG tablet; Take 1 tablet (40 mg total) by mouth every evening.  Dispense: 30 tablet; Refill: 5  3. Hypothyroidism, unspecified hypothyroidism type - levothyroxine (SYNTHROID) 150 MCG tablet; Take 1 tablet (150 mcg total) by mouth daily before breakfast.  Dispense: 30 tablet; Refill: 5 - Thyroid Panel With TSH  4. Constipation, unspecified constipation type Trying new med trulance - Plecanatide (TRULANCE) 3 MG TABS; Take 1 tablet by mouth daily.  Dispense: 30 tablet; Refill: 3 - polyethylene glycol powder (GLYCOLAX/MIRALAX) powder; MIX  17GRAMS (1 CAPFUL) WITH 8 OUNCES OF LIQUID AND DRINK BY MOUTH 255 GRAMS twice daily DAILY  Dispense: 1700 g; Refill: 2  5. Screening for malignant neoplasm of the rectum - Fecal occult blood, imunochemical; Future  6. Need for hepatitis C screening test - Hepatitis C antibody    Labs pending Health maintenance reviewed Diet and exercise encouraged Continue all meds Follow up  In 3 month   [place], FNP

## 2015-12-13 ENCOUNTER — Telehealth: Payer: Self-pay

## 2015-12-13 LAB — CMP14+EGFR
A/G RATIO: 2.1 (ref 1.2–2.2)
ALK PHOS: 74 IU/L (ref 39–117)
ALT: 28 IU/L (ref 0–32)
AST: 26 IU/L (ref 0–40)
Albumin: 4.2 g/dL (ref 3.5–5.5)
BILIRUBIN TOTAL: 0.3 mg/dL (ref 0.0–1.2)
BUN/Creatinine Ratio: 10 (ref 9–23)
BUN: 9 mg/dL (ref 6–24)
CHLORIDE: 96 mmol/L (ref 96–106)
CO2: 27 mmol/L (ref 18–29)
Calcium: 10 mg/dL (ref 8.7–10.2)
Creatinine, Ser: 0.92 mg/dL (ref 0.57–1.00)
GFR calc Af Amer: 80 mL/min/{1.73_m2} (ref >59)
GFR calc non Af Amer: 70 mL/min/{1.73_m2} (ref >59)
GLUCOSE: 159 mg/dL — AB (ref 65–99)
Globulin, Total: 2 g/dL (ref 1.5–4.5)
POTASSIUM: 4.1 mmol/L (ref 3.5–5.2)
Sodium: 139 mmol/L (ref 134–144)
TOTAL PROTEIN: 6.2 g/dL (ref 6.0–8.5)

## 2015-12-13 LAB — THYROID PANEL WITH TSH
Free Thyroxine Index: 3 (ref 1.2–4.9)
T3 UPTAKE RATIO: 31 % (ref 24–39)
T4 TOTAL: 9.7 ug/dL (ref 4.5–12.0)
TSH: 0.012 u[IU]/mL — AB (ref 0.450–4.500)

## 2015-12-13 LAB — LIPID PANEL
CHOLESTEROL TOTAL: 202 mg/dL — AB (ref 100–199)
Chol/HDL Ratio: 2.3 ratio units (ref 0.0–4.4)
HDL: 86 mg/dL (ref >39)
LDL Calculated: 94 mg/dL (ref 0–99)
Triglycerides: 108 mg/dL (ref 0–149)
VLDL CHOLESTEROL CAL: 22 mg/dL (ref 5–40)

## 2015-12-13 LAB — HEPATITIS C ANTIBODY

## 2015-12-13 NOTE — Telephone Encounter (Signed)
Insurance authorized [employer]

## 2015-12-16 ENCOUNTER — Other Ambulatory Visit: Payer: Self-pay | Admitting: Nurse Practitioner

## 2015-12-16 MED ORDER — LEVOTHYROXINE SODIUM 125 MCG PO TABS: 125.0000 ug | ORAL_TABLET | Freq: Every day | ORAL | Status: DC

## 2015-12-18 ENCOUNTER — Telehealth: Payer: Self-pay | Admitting: Nurse Practitioner

## 2015-12-18 ENCOUNTER — Other Ambulatory Visit: Payer: Self-pay

## 2015-12-18 MED ORDER — LEVOTHYROXINE SODIUM 125 MCG PO TABS: 125.0000 ug | ORAL_TABLET | Freq: Every day | ORAL | Status: DC

## 2015-12-18 NOTE — Telephone Encounter (Signed)
Left detailed message on patients voicemail that rx transferred to [facility] in [place]

## 2016-01-14 DIAGNOSIS — H2511 Age-related nuclear cataract, right eye: Secondary | ICD-10-CM | POA: Diagnosis not present

## 2016-01-15 DIAGNOSIS — H2512 Age-related nuclear cataract, left eye: Secondary | ICD-10-CM | POA: Diagnosis not present

## 2016-01-17 ENCOUNTER — Other Ambulatory Visit: Payer: BLUE CROSS/BLUE SHIELD

## 2016-01-17 DIAGNOSIS — Z1212 Encounter for screening for malignant neoplasm of rectum: Secondary | ICD-10-CM | POA: Diagnosis not present

## 2016-01-18 LAB — FECAL OCCULT BLOOD, IMMUNOCHEMICAL: Fecal Occult Bld: NEGATIVE

## 2016-01-21 DIAGNOSIS — H2512 Age-related nuclear cataract, left eye: Secondary | ICD-10-CM | POA: Diagnosis not present

## 2016-01-28 ENCOUNTER — Ambulatory Visit (INDEPENDENT_AMBULATORY_CARE_PROVIDER_SITE_OTHER): Payer: BLUE CROSS/BLUE SHIELD | Admitting: Family Medicine

## 2016-01-28 ENCOUNTER — Encounter: Payer: Self-pay | Admitting: Family Medicine

## 2016-01-28 VITALS — BP 130/76 | HR 69 | Temp 97.6°F | Ht 67.0 in | Wt 167.4 lb

## 2016-01-28 DIAGNOSIS — H9311 Tinnitus, right ear: Secondary | ICD-10-CM | POA: Diagnosis not present

## 2016-01-28 NOTE — Patient Instructions (Signed)

## 2016-01-28 NOTE — Progress Notes (Signed)
Subjective:    Patient ID: Alejandra Mcdonald, female    DOB: 1961/02/06, 55 y.o.   MRN: [id number]  HPI 55 year old female with ringing in her ears. She also has some loss of hearing and some occasional vertigo. The ringing is really described as more need to get her ears to unstop or open up. She describes it as [place] or the ears pop or need to pop but will not do so. There is no history of exposure to loud noises in the past.  She also complains of a lump on the dorsum of her right hand. She does not recall any trauma to the area.  Patient Active Problem List   Diagnosis Date Noted  . Constipation 04/10/2013  . Hypothyroidism 11/18/2012  . Hyperlipidemia with target LDL less than 100 11/18/2012  . Type 1 diabetes mellitus with target hemoglobin A1c of less than 7.5 percent ([place]) 11/18/2012   Outpatient Encounter Prescriptions as of 01/28/2016  Medication Sig  . albuterol (PROVENTIL HFA;VENTOLIN HFA) 108 (90 Base) MCG/ACT inhaler Inhale 2 puffs into the lungs every 6 (six) hours as needed for wheezing or shortness of breath.  Marland Kitchen aspirin 81 MG tablet Take 81 mg by mouth daily.    Marland Kitchen BESIVANCE 0.6 % SUSP INSTIL 1 DROP IN LEFT EYE THREE TIMES A DAY  . calcium citrate-vitamin D 200-200 MG-UNIT TABS Take 1 tablet by mouth 2 (two) times daily.   . DUREZOL 0.05 % EMUL USE 1 DROP IN LEFT EYE THREE TIMES DAILY  . levothyroxine (SYNTHROID, LEVOTHROID) 125 MCG tablet Take 1 tablet (125 mcg total) by mouth daily.  . Multiple Vitamins-Minerals (MULTIVITAMIN,TX-MINERALS) tablet Take 1 tablet by mouth daily.    Marland Kitchen Plecanatide (TRULANCE) 3 MG TABS Take 1 tablet by mouth daily.  . polyethylene glycol powder (GLYCOLAX/MIRALAX) powder MIX 17GRAMS (1 CAPFUL) WITH 8 OUNCES OF LIQUID AND DRINK BY MOUTH  twice daily DAILY  . PROLENSA 0.07 % SOLN USE 1 DROP IN LEFT EYE AT BEDTIME  . simvastatin (ZOCOR) 40 MG tablet Take 1 tablet (40 mg total) by mouth every evening.  . insulin aspart (NOVOLOG) 100  UNIT/ML injection Per pump up to 100 units per day  . [DISCONTINUED] Linaclotide (LINZESS) 290 MCG CAPS capsule Take 1 capsule (290 mcg total) by mouth daily.   No facility-administered encounter medications on file as of 01/28/2016.      Review of Systems  Constitutional: Negative.   HENT: Positive for hearing loss and tinnitus.   Neurological: Positive for dizziness.       Objective:   Physical Exam  Constitutional: She appears well-developed and well-nourished.  HENT:  Both tympanic membranes are immobile and retracted and did not move with Valsalva maneuver. Tuning fork test show some hearing loss in the right ear and that is confirmed with audiometer. Hearing in the left ear is normal with audiometer.  Musculoskeletal:  She has a small ganglion cyst on the dorsum of her right hand. It is hardly noticeable. I told her it may get larger but natural history as it should go away on its own   BP 130/76 mmHg  Pulse 69  Temp(Src) 97.6 F (36.4 C) (Oral)  Ht 5\' 7"  (1.702 m)  Wt 167 lb 6.4 oz (75.932 kg)  BMI 26.21 kg/m2        Assessment & Plan:  1. Tinnitus, right I think this is related to eustachian tube dysfunction. With the vertigo and hearing loss there may be has suggestion  of Mnire's syndrome but I will treat it with nasal and eustachian tube decongestants and steroids and ear exercises initially. If symptoms do not resolve consider referral to ENT.  Wardell Honour MD

## 2016-02-04 DIAGNOSIS — E109 Type 1 diabetes mellitus without complications: Secondary | ICD-10-CM | POA: Diagnosis not present

## 2016-02-06 ENCOUNTER — Telehealth: Payer: Self-pay

## 2016-02-06 DIAGNOSIS — H9313 Tinnitus, bilateral: Secondary | ICD-10-CM

## 2016-02-06 NOTE — Telephone Encounter (Signed)
Detailed message left for patient that referral has been placed.

## 2016-02-06 NOTE — Telephone Encounter (Signed)
Dr Sabra Heck said he would refer me to ENT  Want to go to Dr Olene Craven in [place]

## 2016-02-06 NOTE — Telephone Encounter (Signed)
Okay to refer to ENT of patient's choice in [place]

## 2016-03-13 DIAGNOSIS — M545 Low back pain: Secondary | ICD-10-CM | POA: Diagnosis not present

## 2016-03-23 DIAGNOSIS — E109 Type 1 diabetes mellitus without complications: Secondary | ICD-10-CM | POA: Diagnosis not present

## 2016-03-26 DIAGNOSIS — Z981 Arthrodesis status: Secondary | ICD-10-CM | POA: Diagnosis not present

## 2016-03-26 DIAGNOSIS — M5126 Other intervertebral disc displacement, lumbar region: Secondary | ICD-10-CM | POA: Diagnosis not present

## 2016-03-26 DIAGNOSIS — I700 Atherosclerosis of aorta: Secondary | ICD-10-CM | POA: Diagnosis not present

## 2016-03-26 DIAGNOSIS — M545 Low back pain: Secondary | ICD-10-CM | POA: Diagnosis not present

## 2016-03-26 DIAGNOSIS — M4186 Other forms of scoliosis, lumbar region: Secondary | ICD-10-CM | POA: Diagnosis not present

## 2016-04-06 DIAGNOSIS — M4696 Unspecified inflammatory spondylopathy, lumbar region: Secondary | ICD-10-CM | POA: Diagnosis not present

## 2016-04-08 DIAGNOSIS — M545 Low back pain: Secondary | ICD-10-CM | POA: Diagnosis not present

## 2016-04-08 DIAGNOSIS — M47816 Spondylosis without myelopathy or radiculopathy, lumbar region: Secondary | ICD-10-CM | POA: Diagnosis not present

## 2016-04-08 DIAGNOSIS — M5136 Other intervertebral disc degeneration, lumbar region: Secondary | ICD-10-CM | POA: Diagnosis not present

## 2016-04-09 ENCOUNTER — Other Ambulatory Visit: Payer: Self-pay | Admitting: Nurse Practitioner

## 2016-04-09 DIAGNOSIS — K5900 Constipation, unspecified: Secondary | ICD-10-CM

## 2016-04-10 DIAGNOSIS — M47816 Spondylosis without myelopathy or radiculopathy, lumbar region: Secondary | ICD-10-CM | POA: Diagnosis not present

## 2016-04-10 DIAGNOSIS — M545 Low back pain: Secondary | ICD-10-CM | POA: Diagnosis not present

## 2016-04-16 ENCOUNTER — Encounter: Payer: Self-pay | Admitting: Nurse Practitioner

## 2016-04-16 ENCOUNTER — Ambulatory Visit (INDEPENDENT_AMBULATORY_CARE_PROVIDER_SITE_OTHER): Payer: BLUE CROSS/BLUE SHIELD | Admitting: Nurse Practitioner

## 2016-04-16 VITALS — BP 110/66 | HR 64 | Temp 97.6°F | Ht 67.0 in | Wt 156.0 lb

## 2016-04-16 DIAGNOSIS — E109 Type 1 diabetes mellitus without complications: Secondary | ICD-10-CM | POA: Diagnosis not present

## 2016-04-16 DIAGNOSIS — K5900 Constipation, unspecified: Secondary | ICD-10-CM

## 2016-04-16 DIAGNOSIS — E785 Hyperlipidemia, unspecified: Secondary | ICD-10-CM

## 2016-04-16 DIAGNOSIS — E039 Hypothyroidism, unspecified: Secondary | ICD-10-CM | POA: Diagnosis not present

## 2016-04-16 LAB — BAYER DCA HB A1C WAIVED: HB A1C: 7.4 % — AB (ref <7.0)

## 2016-04-16 MED ORDER — SIMVASTATIN 40 MG PO TABS: 40.0000 mg | ORAL_TABLET | Freq: Every evening | ORAL | 5 refills | Status: DC

## 2016-04-16 MED ORDER — POLYETHYLENE GLYCOL 3350 17 GM/SCOOP PO POWD: ORAL | 2 refills | Status: DC

## 2016-04-16 MED ORDER — PLECANATIDE 3 MG PO TABS: 1.0000 | ORAL_TABLET | Freq: Every day | ORAL | 5 refills | Status: DC

## 2016-04-16 NOTE — Patient Instructions (Signed)
Health Maintenance, Female Adopting a healthy lifestyle and getting preventive care can go a long way to promote health and wellness. Talk with your health care provider about what schedule of regular examinations is right for you. This is a good chance for you to check in with your provider about disease prevention and staying healthy. In between checkups, there are plenty of things you can do on your own. Experts have done a lot of research about which lifestyle changes and preventive measures are most likely to keep you healthy. Ask your health care provider for more information. WEIGHT AND DIET  Eat a healthy diet  Be sure to include plenty of vegetables, fruits, low-fat dairy products, and lean protein.  Do not eat a lot of foods high in solid fats, added sugars, or salt.  Get regular exercise. This is one of the most important things you can do for your health.  Most adults should exercise for at least 150 minutes each week. The exercise should increase your heart rate and make you sweat (moderate-intensity exercise).  Most adults should also do strengthening exercises at least twice a week. This is in addition to the moderate-intensity exercise.  Maintain a healthy weight  Body mass index (BMI) is a measurement that can be used to identify possible weight problems. It estimates body fat based on height and weight. Your health care provider can help determine your BMI and help you achieve or maintain a healthy weight.  For females 20 years of age and older:   A BMI below 18.5 is considered underweight.  A BMI of 18.5 to 24.9 is normal.  A BMI of 25 to 29.9 is considered overweight.  A BMI of 30 and above is considered obese.  Watch levels of cholesterol and blood lipids  You should start having your blood tested for lipids and cholesterol at 56 years of age, then have this test every 5 years.  You may need to have your cholesterol levels checked more often if:  Your lipid  or cholesterol levels are high.  You are older than 56 years of age.  You are at high risk for heart disease.  CANCER SCREENING   Lung Cancer  Lung cancer screening is recommended for adults 55-80 years old who are at high risk for lung cancer because of a history of smoking.  A yearly low-dose CT scan of the lungs is recommended for people who:  Currently smoke.  Have quit within the past 15 years.  Have at least a 30-pack-year history of smoking. A pack year is smoking an average of one pack of cigarettes a day for 1 year.  Yearly screening should continue until it has been 15 years since you quit.  Yearly screening should stop if you develop a health problem that would prevent you from having lung cancer treatment.  Breast Cancer  Practice breast self-awareness. This means understanding how your breasts normally appear and feel.  It also means doing regular breast self-exams. Let your health care provider know about any changes, no matter how small.  If you are in your 20s or 30s, you should have a clinical breast exam (CBE) by a health care provider every 1-3 years as part of a regular health exam.  If you are 40 or older, have a CBE every year. Also consider having a breast X-ray (mammogram) every year.  If you have a family history of breast cancer, talk to your health care provider about genetic screening.  If you   are at high risk for breast cancer, talk to your health care provider about having an MRI and a mammogram every year.  Breast cancer gene (BRCA) assessment is recommended for women who have family members with BRCA-related cancers. BRCA-related cancers include:  Breast.  Ovarian.  Tubal.  Peritoneal cancers.  Results of the assessment will determine the need for genetic counseling and BRCA1 and BRCA2 testing. Cervical Cancer Your health care provider may recommend that you be screened regularly for cancer of the pelvic organs (ovaries, uterus, and  vagina). This screening involves a pelvic examination, including checking for microscopic changes to the surface of your cervix (Pap test). You may be encouraged to have this screening done every 3 years, beginning at age 21.  For women ages 30-65, health care providers may recommend pelvic exams and Pap testing every 3 years, or they may recommend the Pap and pelvic exam, combined with testing for human papilloma virus (HPV), every 5 years. Some types of HPV increase your risk of cervical cancer. Testing for HPV may also be done on women of any age with unclear Pap test results.  Other health care providers may not recommend any screening for nonpregnant women who are considered low risk for pelvic cancer and who do not have symptoms. Ask your health care provider if a screening pelvic exam is right for you.  If you have had past treatment for cervical cancer or a condition that could lead to cancer, you need Pap tests and screening for cancer for at least 20 years after your treatment. If Pap tests have been discontinued, your risk factors (such as having a new sexual partner) need to be reassessed to determine if screening should resume. Some women have medical problems that increase the chance of getting cervical cancer. In these cases, your health care provider may recommend more frequent screening and Pap tests. Colorectal Cancer  This type of cancer can be detected and often prevented.  Routine colorectal cancer screening usually begins at 56 years of age and continues through 56 years of age.  Your health care provider may recommend screening at an earlier age if you have risk factors for colon cancer.  Your health care provider may also recommend using home test kits to check for hidden blood in the stool.  A small camera at the end of a tube can be used to examine your colon directly (sigmoidoscopy or colonoscopy). This is done to check for the earliest forms of colorectal  cancer.  Routine screening usually begins at age 50.  Direct examination of the colon should be repeated every 5-10 years through 56 years of age. However, you may need to be screened more often if early forms of precancerous polyps or small growths are found. Skin Cancer  Check your skin from head to toe regularly.  Tell your health care provider about any new moles or changes in moles, especially if there is a change in a mole's shape or color.  Also tell your health care provider if you have a mole that is larger than the size of a pencil eraser.  Always use sunscreen. Apply sunscreen liberally and repeatedly throughout the day.  Protect yourself by wearing long sleeves, pants, a wide-brimmed hat, and sunglasses whenever you are outside. HEART DISEASE, DIABETES, AND HIGH BLOOD PRESSURE   High blood pressure causes heart disease and increases the risk of stroke. High blood pressure is more likely to develop in:  People who have blood pressure in the high end   of the normal range (130-139/85-89 mm Hg).  People who are overweight or obese.  People who are African American.  If you are 38-23 years of age, have your blood pressure checked every 3-5 years. If you are 61 years of age or older, have your blood pressure checked every year. You should have your blood pressure measured twice--once when you are at a hospital or clinic, and once when you are not at a hospital or clinic. Record the average of the two measurements. To check your blood pressure when you are not at a hospital or clinic, you can use:  An automated blood pressure machine at a pharmacy.  A home blood pressure monitor.  If you are between 45 years and 39 years old, ask your health care provider if you should take aspirin to prevent strokes.  Have regular diabetes screenings. This involves taking a blood sample to check your fasting blood sugar level.  If you are at a normal weight and have a low risk for diabetes,  have this test once every three years after 56 years of age.  If you are overweight and have a high risk for diabetes, consider being tested at a younger age or more often. PREVENTING INFECTION  Hepatitis B  If you have a higher risk for hepatitis B, you should be screened for this virus. You are considered at high risk for hepatitis B if:  You were born in a country where hepatitis B is common. Ask your health care provider which countries are considered high risk.  Your parents were born in a high-risk country, and you have not been immunized against hepatitis B (hepatitis B vaccine).  You have HIV or AIDS.  You use needles to inject street drugs.  You live with someone who has hepatitis B.  You have had sex with someone who has hepatitis B.  You get hemodialysis treatment.  You take certain medicines for conditions, including cancer, organ transplantation, and autoimmune conditions. Hepatitis C  Blood testing is recommended for:  Everyone born from [birth year] through 1966.  Anyone with known risk factors for hepatitis C. Sexually transmitted infections (STIs)  You should be screened for sexually transmitted infections (STIs) including gonorrhea and chlamydia if:  You are sexually active and are younger than 56 years of age.  You are older than 56 years of age and your health care provider tells you that you are at risk for this type of infection.  Your sexual activity has changed since you were last screened and you are at an increased risk for chlamydia or gonorrhea. Ask your health care provider if you are at risk.  If you do not have HIV, but are at risk, it may be recommended that you take a prescription medicine daily to prevent HIV infection. This is called pre-exposure prophylaxis (PrEP). You are considered at risk if:  You are sexually active and do not regularly use condoms or know the HIV status of your partner(s).  You take drugs by injection.  You are sexually  active with a partner who has HIV. Talk with your health care provider about whether you are at high risk of being infected with HIV. If you choose to begin PrEP, you should first be tested for HIV. You should then be tested every 3 months for as long as you are taking PrEP.  PREGNANCY   If you are premenopausal and you may become pregnant, ask your health care provider about preconception counseling.  If you may  become pregnant, take 400 to 800 micrograms (mcg) of folic acid every day.  If you want to prevent pregnancy, talk to your health care provider about birth control (contraception). OSTEOPOROSIS AND MENOPAUSE   Osteoporosis is a disease in which the bones lose minerals and strength with aging. This can result in serious bone fractures. Your risk for osteoporosis can be identified using a bone density scan.  If you are 61 years of age or older, or if you are at risk for osteoporosis and fractures, ask your health care provider if you should be screened.  Ask your health care provider whether you should take a calcium or vitamin D supplement to lower your risk for osteoporosis.  Menopause may have certain physical symptoms and risks.  Hormone replacement therapy may reduce some of these symptoms and risks. Talk to your health care provider about whether hormone replacement therapy is right for you.  HOME CARE INSTRUCTIONS   Schedule regular health, dental, and eye exams.  Stay current with your immunizations.   Do not use any tobacco products including cigarettes, chewing tobacco, or electronic cigarettes.  If you are pregnant, do not drink alcohol.  If you are breastfeeding, limit how much and how often you drink alcohol.  Limit alcohol intake to no more than 1 drink per day for nonpregnant women. One drink equals 12 ounces of beer, 5 ounces of wine, or 1 ounces of hard liquor.  Do not use street drugs.  Do not share needles.  Ask your health care provider for help if  you need support or information about quitting drugs.  Tell your health care provider if you often feel depressed.  Tell your health care provider if you have ever been abused or do not feel safe at home.   This information is not intended to replace advice given to you by your health care provider. Make sure you discuss any questions you have with your health care provider.   Document Released: 02/16/2011 Document Revised: 08/24/2014 Document Reviewed: 07/05/2013 Elsevier Interactive Patient Education [employer].

## 2016-04-16 NOTE — Progress Notes (Signed)
Subjective:    Patient ID: Alejandra Mcdonald, female    DOB: [birth year], 55 y.o.   MRN: [id number]   Patient here today for follow up of chronic medical problems.  Outpatient Encounter Prescriptions as of 12/12/2015  Medication Sig  . albuterol (PROVENTIL HFA;VENTOLIN HFA) 108 (90 Base) MCG/ACT inhaler Inhale 2 puffs into the lungs every 6 (six) hours as needed for wheezing or shortness of breath.  Marland Kitchen aspirin 81 MG tablet Take 81 mg by mouth daily.    . calcium citrate-vitamin D 200-200 MG-UNIT TABS Take 1 tablet by mouth 2 (two) times daily.   . insulin aspart (NOVOLOG) 100 UNIT/ML injection Per pump up to 100 units per day  . levothyroxine (SYNTHROID) 150 MCG tablet Take 1 tablet (150 mcg total) by mouth daily before breakfast.  . Linaclotide (LINZESS) 290 MCG CAPS capsule Take 1 capsule (290 mcg total) by mouth daily.  . Multiple Vitamins-Minerals (MULTIVITAMIN,TX-MINERALS) tablet Take 1 tablet by mouth daily.    . polyethylene glycol powder (GLYCOLAX/MIRALAX) powder MIX 17GRAMS (1 CAPFUL) WITH 8 OUNCES OF LIQUID AND DRINK BY MOUTH 255 GRAMS ONCE DAILY  . simvastatin (ZOCOR) 40 MG tablet Take 1 tablet (40 mg total) by mouth every evening.            Diabetes  She presents for her follow-up diabetic visit. She has type 1 diabetes mellitus. MedicAlert identification noted. Her disease course has been fluctuating. Pertinent negatives for hypoglycemia include no confusion, dizziness, nervousness/anxiousness or tremors. Pertinent negatives for diabetes include no chest pain, no fatigue, no polydipsia, no polyphagia and no weakness. There are no hypoglycemic complications. There are no diabetic complications. Risk factors for coronary artery disease include diabetes mellitus, dyslipidemia, hypertension and post-menopausal. Current diabetic treatment includes insulin pump and insulin injections. She is compliant with treatment all of the time. Her weight is stable. When asked about meal planning, she  reported none. She has not had a previous visit with a dietitian. She rarely participates in exercise. There is no change in her home blood glucose trend. Her breakfast blood glucose range is generally 130-140 mg/dl. Her lunch blood glucose range is generally 140-180 mg/dl. Her highest blood glucose is 180-200 mg/dl. Her overall blood glucose range is 130-140 mg/dl. An ACE inhibitor/angiotensin II receptor blocker is not being taken. She does not see a podiatrist.Eye exam is not current.  Hyperlipidemia  This is a chronic problem. The current episode started more than 1 year ago. The problem is controlled. Recent lipid tests were reviewed and are normal. Pertinent negatives include no chest pain, myalgias or shortness of breath. Current antihyperlipidemic treatment includes statins. Compliance problems include adherence to diet and adherence to exercise.  Risk factors for coronary artery disease include diabetes mellitus and dyslipidemia.  Hypothyroidism This is a chronic problem. Currently on Zocor. Taking daily as Rx. Exercises 5x/week. Walks 10,000 steps a day. Weight watchers diet Lumbar Pars defect Had back surgery 4 month ago and is no longer in back brace- said that her pain is much better, almost completely resolved. Constipation Has tried linzess- miralax is the only thing that has helped her   Review of Systems  Constitutional: Negative for fatigue.  Respiratory: Positive for cough (Nonproductive). Negative for shortness of breath.   Cardiovascular: Negative for chest pain.  Endocrine: Negative for polydipsia and polyphagia.  Musculoskeletal: Negative for myalgias.  Neurological: Negative for dizziness, tremors and weakness.  Psychiatric/Behavioral: Negative for confusion. The patient is not nervous/anxious.   All other systems reviewed and  are negative.      Objective:   Physical Exam  Constitutional: She is oriented to person, place, and time. She appears well-developed and  well-nourished.  HENT:  Nose: Nose normal.  Mouth/Throat: Oropharynx is clear and moist.  Eyes: EOM are normal.  Neck: Trachea normal, normal range of motion and full passive range of motion without pain. Neck supple. No JVD present. Carotid bruit is not present. No thyromegaly present.  Cardiovascular: Normal rate, regular rhythm, normal heart sounds and intact distal pulses.  Exam reveals no gallop and no friction rub.   No murmur heard. Pulmonary/Chest: Effort normal and breath sounds normal.  Abdominal: Soft. Bowel sounds are normal. She exhibits no distension and no mass. There is no tenderness.  Musculoskeletal: Normal range of motion.  FROM of right shoulder with pain on full abduction and internal rotation.  Lymphadenopathy:    She has no cervical adenopathy.  Neurological: She is alert and oriented to person, place, and time. She has normal reflexes.  Skin: Skin is warm and dry.  Psychiatric: She has a normal mood and affect. Her behavior is normal. Judgment and thought content normal.   BP 110/66   Pulse 64   Temp 97.6 F (36.4 C) (Oral)   Ht 5' 7"  (1.702 m)   Wt 156 lb (70.8 kg)   BMI 24.43 kg/m   hgba1c 7.4% down from 7.9%  Assessment & Plan:  1. Type 1 diabetes mellitus with target hemoglobin A1c of less than 7.5 percent (HCC) Stricter carb counting - Bayer DCA Hb A1c Waived - CMP14+EGFR  2. Hyperlipidemia with target LDL less than 100 Low fat diet - Lipid panel - simvastatin (ZOCOR) 40 MG tablet; Take 1 tablet (40 mg total) by mouth every evening.  Dispense: 30 tablet; Refill: 5  3. Hypothyroidism, unspecified hypothyroidism type  4. Constipation, unspecified constipation type Increase fiber in diet - Plecanatide (TRULANCE) 3 MG TABS; Take 1 tablet by mouth daily.  Dispense: 30 tablet; Refill: 5 - polyethylene glycol powder (GLYCOLAX/MIRALAX) powder; MIX 17GRAMS (1 CAPFUL) WITH 8 OUNCES OF LIQUID AND DRINK BY MOUTH  twice daily DAILY  Dispense: 1700 g;  Refill: 2    Labs pending Health maintenance reviewed Diet and exercise encouraged Continue all meds Follow up  In 3 month   [place], FNP

## 2016-04-17 LAB — LIPID PANEL
CHOLESTEROL TOTAL: 193 mg/dL (ref 100–199)
Chol/HDL Ratio: 2.1 ratio units (ref 0.0–4.4)
HDL: 93 mg/dL (ref >39)
LDL Calculated: 89 mg/dL (ref 0–99)
TRIGLYCERIDES: 53 mg/dL (ref 0–149)
VLDL CHOLESTEROL CAL: 11 mg/dL (ref 5–40)

## 2016-04-17 LAB — CMP14+EGFR
ALBUMIN: 4.5 g/dL (ref 3.5–5.5)
ALK PHOS: 60 IU/L (ref 39–117)
ALT: 24 IU/L (ref 0–32)
AST: 24 IU/L (ref 0–40)
Albumin/Globulin Ratio: 2.4 — ABNORMAL HIGH (ref 1.2–2.2)
BILIRUBIN TOTAL: 0.3 mg/dL (ref 0.0–1.2)
BUN / CREAT RATIO: 14 (ref 9–23)
BUN: 15 mg/dL (ref 6–24)
CHLORIDE: 95 mmol/L — AB (ref 96–106)
CO2: 29 mmol/L (ref 18–29)
Calcium: 9.7 mg/dL (ref 8.7–10.2)
Creatinine, Ser: 1.11 mg/dL — ABNORMAL HIGH (ref 0.57–1.00)
GFR calc Af Amer: 64 mL/min/{1.73_m2} (ref >59)
GFR calc non Af Amer: 56 mL/min/{1.73_m2} — ABNORMAL LOW (ref >59)
GLUCOSE: 135 mg/dL — AB (ref 65–99)
Globulin, Total: 1.9 g/dL (ref 1.5–4.5)
Potassium: 4.2 mmol/L (ref 3.5–5.2)
Sodium: 139 mmol/L (ref 134–144)
Total Protein: 6.4 g/dL (ref 6.0–8.5)

## 2016-05-05 DIAGNOSIS — E109 Type 1 diabetes mellitus without complications: Secondary | ICD-10-CM | POA: Diagnosis not present

## 2016-05-09 DIAGNOSIS — Z23 Encounter for immunization: Secondary | ICD-10-CM | POA: Diagnosis not present

## 2016-05-29 DIAGNOSIS — M545 Low back pain: Secondary | ICD-10-CM | POA: Diagnosis not present

## 2016-05-29 DIAGNOSIS — M5136 Other intervertebral disc degeneration, lumbar region: Secondary | ICD-10-CM | POA: Diagnosis not present

## 2016-06-24 DIAGNOSIS — H26492 Other secondary cataract, left eye: Secondary | ICD-10-CM | POA: Diagnosis not present

## 2016-06-24 DIAGNOSIS — E119 Type 2 diabetes mellitus without complications: Secondary | ICD-10-CM | POA: Diagnosis not present

## 2016-06-24 DIAGNOSIS — Z961 Presence of intraocular lens: Secondary | ICD-10-CM | POA: Diagnosis not present

## 2016-06-26 ENCOUNTER — Other Ambulatory Visit: Payer: Self-pay | Admitting: Nurse Practitioner

## 2016-06-26 DIAGNOSIS — K5900 Constipation, unspecified: Secondary | ICD-10-CM

## 2016-07-13 DIAGNOSIS — H26491 Other secondary cataract, right eye: Secondary | ICD-10-CM | POA: Diagnosis not present

## 2016-07-13 DIAGNOSIS — Z961 Presence of intraocular lens: Secondary | ICD-10-CM | POA: Diagnosis not present

## 2016-07-17 ENCOUNTER — Encounter: Payer: Self-pay | Admitting: Nurse Practitioner

## 2016-07-17 DIAGNOSIS — H47093 Other disorders of optic nerve, not elsewhere classified, bilateral: Secondary | ICD-10-CM | POA: Diagnosis not present

## 2016-07-17 LAB — HM DIABETES EYE EXAM

## 2016-07-21 ENCOUNTER — Encounter: Payer: Self-pay | Admitting: Nurse Practitioner

## 2016-07-21 ENCOUNTER — Ambulatory Visit (INDEPENDENT_AMBULATORY_CARE_PROVIDER_SITE_OTHER): Payer: BLUE CROSS/BLUE SHIELD | Admitting: Nurse Practitioner

## 2016-07-21 VITALS — BP 119/67 | HR 68 | Temp 97.0°F | Ht 67.0 in | Wt 154.0 lb

## 2016-07-21 DIAGNOSIS — K5900 Constipation, unspecified: Secondary | ICD-10-CM

## 2016-07-21 DIAGNOSIS — E039 Hypothyroidism, unspecified: Secondary | ICD-10-CM | POA: Diagnosis not present

## 2016-07-21 DIAGNOSIS — E785 Hyperlipidemia, unspecified: Secondary | ICD-10-CM | POA: Diagnosis not present

## 2016-07-21 DIAGNOSIS — K641 Second degree hemorrhoids: Secondary | ICD-10-CM

## 2016-07-21 DIAGNOSIS — E109 Type 1 diabetes mellitus without complications: Secondary | ICD-10-CM

## 2016-07-21 LAB — BAYER DCA HB A1C WAIVED: HB A1C (BAYER DCA - WAIVED): 7.1 % — ABNORMAL HIGH (ref <7.0)

## 2016-07-21 MED ORDER — LEVOTHYROXINE SODIUM 125 MCG PO TABS: 125.0000 ug | ORAL_TABLET | Freq: Every day | ORAL | 0 refills | Status: DC

## 2016-07-21 MED ORDER — NALDEMEDINE TOSYLATE 0.2 MG PO TABS: 1.0000 | ORAL_TABLET | Freq: Every day | ORAL | 5 refills | Status: DC

## 2016-07-21 MED ORDER — LINACLOTIDE 290 MCG PO CAPS: 290.0000 ug | ORAL_CAPSULE | Freq: Every day | ORAL | 0 refills | Status: DC

## 2016-07-21 MED ORDER — POLYETHYLENE GLYCOL 3350 17 GM/SCOOP PO POWD: ORAL | 2 refills | Status: DC

## 2016-07-21 MED ORDER — PLECANATIDE 3 MG PO TABS: 1.0000 | ORAL_TABLET | Freq: Every day | ORAL | 5 refills | Status: DC

## 2016-07-21 MED ORDER — LEVOTHYROXINE SODIUM 125 MCG PO TABS: 125.0000 ug | ORAL_TABLET | Freq: Every day | ORAL | 1 refills | Status: DC

## 2016-07-21 MED ORDER — SIMVASTATIN 40 MG PO TABS: 40.0000 mg | ORAL_TABLET | Freq: Every evening | ORAL | 5 refills | Status: DC

## 2016-07-21 NOTE — Patient Instructions (Signed)
Constipation, Adult °Constipation is when a person: °· Poops (has a bowel movement) fewer times in a week than normal. °· Has a hard time pooping. °· Has poop that is dry, hard, or bigger than normal. ° °Follow these instructions at home: °Eating and drinking ° °· Eat foods that have a lot of fiber, such as: °? Fresh fruits and vegetables. °? Whole grains. °? Beans. °· Eat less of foods that are high in fat, low in fiber, or overly processed, such as: °? French fries. °? Hamburgers. °? Cookies. °? Candy. °? Soda. °· Drink enough fluid to keep your pee (urine) clear or pale yellow. °General instructions °· Exercise regularly or as told by your doctor. °· Go to the restroom when you feel like you need to poop. Do not hold it in. °· Take over-the-counter and prescription medicines only as told by your doctor. These include any fiber supplements. °· Do pelvic floor retraining exercises, such as: °? Doing deep breathing while relaxing your lower belly (abdomen). °? Relaxing your pelvic floor while pooping. °· Watch your condition for any changes. °· Keep all follow-up visits as told by your doctor. This is important. °Contact a doctor if: °· You have pain that gets worse. °· You have a fever. °· You have not pooped for 4 days. °· You throw up (vomit). °· You are not hungry. °· You lose weight. °· You are bleeding from the anus. °· You have thin, pencil-like poop (stool). °Get help right away if: °· You have a fever, and your symptoms suddenly get worse. °· You leak poop or have blood in your poop. °· Your belly feels hard or bigger than normal (is bloated). °· You have very bad belly pain. °· You feel dizzy or you faint. °This information is not intended to replace advice given to you by your health care provider. Make sure you discuss any questions you have with your health care provider. °Document Released: 01/20/2008 Document Revised: 02/21/2016 Document Reviewed: 01/22/2016 °Elsevier Interactive Patient Education ©  2017 [employer] ° °

## 2016-07-21 NOTE — Progress Notes (Signed)
Subjective:    Patient ID: Alejandra Mcdonald, female    DOB: 1961-04-10, 55 y.o.   MRN: [id number]   Patient here today for follow up of chronic medical problems.  Outpatient Encounter Prescriptions as of 12/12/2015  Medication Sig  . albuterol (PROVENTIL HFA;VENTOLIN HFA) 108 (90 Base) MCG/ACT inhaler Inhale 2 puffs into the lungs every 6 (six) hours as needed for wheezing or shortness of breath.  Marland Kitchen aspirin 81 MG tablet Take 81 mg by mouth daily.    . calcium citrate-vitamin D 200-200 MG-UNIT TABS Take 1 tablet by mouth 2 (two) times daily.   . insulin aspart (NOVOLOG) 100 UNIT/ML injection Per pump up to 100 units per day  . levothyroxine (SYNTHROID) 150 MCG tablet Take 1 tablet (150 mcg total) by mouth daily before breakfast.  . Linaclotide (LINZESS) 290 MCG CAPS capsule Take 1 capsule (290 mcg total) by mouth daily.  . Multiple Vitamins-Minerals (MULTIVITAMIN,TX-MINERALS) tablet Take 1 tablet by mouth daily.    . polyethylene glycol powder (GLYCOLAX/MIRALAX) powder MIX 17GRAMS (1 CAPFUL) WITH 8 OUNCES OF LIQUID AND DRINK BY MOUTH 255 GRAMS ONCE DAILY  . simvastatin (ZOCOR) 40 MG tablet Take 1 tablet (40 mg total) by mouth every evening.            Diabetes  She presents for her follow-up diabetic visit. She has type 1 diabetes mellitus. MedicAlert identification noted. Her disease course has been fluctuating. Pertinent negatives for hypoglycemia include no confusion, dizziness, nervousness/anxiousness or tremors. Pertinent negatives for diabetes include no chest pain, no fatigue, no polydipsia, no polyphagia and no weakness. There are no hypoglycemic complications. There are no diabetic complications. Risk factors for coronary artery disease include diabetes mellitus, dyslipidemia, hypertension and post-menopausal. Current diabetic treatment includes insulin pump and insulin injections. She is compliant with treatment all of the time. Her weight is stable. When asked about meal planning, she  reported none. She has not had a previous visit with a dietitian. She rarely participates in exercise. There is no change in her home blood glucose trend. Her breakfast blood glucose range is generally 130-140 mg/dl. Her lunch blood glucose range is generally 140-180 mg/dl. Her highest blood glucose is 180-200 mg/dl. Her overall blood glucose range is 130-140 mg/dl. An ACE inhibitor/angiotensin II receptor blocker is not being taken. She does not see a podiatrist.Eye exam is not current.  Hyperlipidemia  This is a chronic problem. The current episode started more than 1 year ago. The problem is controlled. Recent lipid tests were reviewed and are normal. Pertinent negatives include no chest pain, myalgias or shortness of breath. Current antihyperlipidemic treatment includes statins. Compliance problems include adherence to diet and adherence to exercise.  Risk factors for coronary artery disease include diabetes mellitus and dyslipidemia.  Hypothyroidism This is a chronic problem. Currently on Zocor. Taking daily as Rx. Exercises 5x/week. Walks 10,000 steps a day. Weight watchers diet Lumbar Pars defect Had back surgery 4 month ago and is no longer in back brace- said that her pain is much better, almost completely resolved. Constipation Was given linzess at last visit and that is not helping either.SHe thinks that her anal sphincter is so tiny that it will not let the stool come out.   Review of Systems  Constitutional: Negative for fatigue.  Respiratory: Positive for cough (Nonproductive). Negative for shortness of breath.   Cardiovascular: Negative for chest pain.  Endocrine: Negative for polydipsia and polyphagia.  Musculoskeletal: Negative for myalgias.  Neurological: Negative for dizziness, tremors and weakness.  Psychiatric/Behavioral: Negative for confusion. The patient is not nervous/anxious.   All other systems reviewed and are negative.      Objective:   Physical Exam   Constitutional: She is oriented to person, place, and time. She appears well-developed and well-nourished.  HENT:  Nose: Nose normal.  Mouth/Throat: Oropharynx is clear and moist.  Eyes: EOM are normal.  Neck: Trachea normal, normal range of motion and full passive range of motion without pain. Neck supple. No JVD present. Carotid bruit is not present. No thyromegaly present.  Cardiovascular: Normal rate, regular rhythm, normal heart sounds and intact distal pulses.  Exam reveals no gallop and no friction rub.   No murmur heard. Pulmonary/Chest: Effort normal and breath sounds normal.  Abdominal: Soft. Bowel sounds are normal. She exhibits no distension and no mass. There is no tenderness.  Musculoskeletal: Normal range of motion.  FROM of right shoulder with pain on full abduction and internal rotation.  Lymphadenopathy:    She has no cervical adenopathy.  Neurological: She is alert and oriented to person, place, and time. She has normal reflexes.  Skin: Skin is warm and dry.  Psychiatric: She has a normal mood and affect. Her behavior is normal. Judgment and thought content normal.   BP 119/67   Pulse 68   Temp 97 F (36.1 C) (Oral)   Ht _0  (1.702 m)   Wt 154 lb (69.9 kg)   BMI 24.12 kg/m    hgba1c 7.1% down from 7.4%  Assessment & Plan:  1. Type 1 diabetes mellitus with target hemoglobin A1c of less than 7.1 percent (HCC) Continue to wathc carsbin diet - Bayer DCA Hb A1c Waived - CMP14+EGFR - Microalbumin / creatinine urine ratio  2. Hyperlipidemia with target LDL less than 100 Low fat diet - Lipid panel - simvastatin (ZOCOR) 40 MG tablet; Take 1 tablet (40 mg total) by mouth every evening.  Dispense: 30 tablet; Refill: 5  3. Constipation, unspecified constipation type Going to try new medication and see if helps Referral to General surgeon to check anal sphincter - polyethylene glycol powder (GLYCOLAX/MIRALAX) powder; MIX 17GRAMS (1 CAPFUL) WITH 8 OUNCES OF  LIQUID AND DRINK BY MOUTH  twice daily DAILY  Dispense: 1700 g; Refill: 2 - Naldemedine Tosylate (SYMPROIC) 0.2 MG TABS; Take 1 tablet by mouth daily.  Dispense: 30 tablet; Refill: 5  4. Hypothyroidism, unspecified type - Thyroid Panel With TSH - levothyroxine (SYNTHROID, LEVOTHROID) 125 MCG tablet; Take 1 tablet (125 mcg total) by mouth daily.  Dispense: 90 tablet; Refill: 1    Labs pending Health maintenance reviewed Diet and exercise encouraged Continue all meds Follow up  In 3 months   [place], FNP

## 2016-07-22 ENCOUNTER — Other Ambulatory Visit: Payer: Self-pay | Admitting: Nurse Practitioner

## 2016-07-22 LAB — CMP14+EGFR
A/G RATIO: 2.3 — AB (ref 1.2–2.2)
ALBUMIN: 4.4 g/dL (ref 3.5–5.5)
ALT: 28 IU/L (ref 0–32)
AST: 28 IU/L (ref 0–40)
Alkaline Phosphatase: 60 IU/L (ref 39–117)
BILIRUBIN TOTAL: 0.2 mg/dL (ref 0.0–1.2)
BUN / CREAT RATIO: 16 (ref 9–23)
BUN: 16 mg/dL (ref 6–24)
CHLORIDE: 100 mmol/L (ref 96–106)
CO2: 31 mmol/L — ABNORMAL HIGH (ref 18–29)
Calcium: 10 mg/dL (ref 8.7–10.2)
Creatinine, Ser: 1 mg/dL (ref 0.57–1.00)
GFR calc non Af Amer: 63 mL/min/{1.73_m2} (ref >59)
GFR, EST AFRICAN AMERICAN: 73 mL/min/{1.73_m2} (ref >59)
Globulin, Total: 1.9 g/dL (ref 1.5–4.5)
Glucose: 46 mg/dL — ABNORMAL LOW (ref 65–99)
POTASSIUM: 4.3 mmol/L (ref 3.5–5.2)
Sodium: 144 mmol/L (ref 134–144)
TOTAL PROTEIN: 6.3 g/dL (ref 6.0–8.5)

## 2016-07-22 LAB — LIPID PANEL
Chol/HDL Ratio: 1.9 ratio units (ref 0.0–4.4)
Cholesterol, Total: 211 mg/dL — ABNORMAL HIGH (ref 100–199)
HDL: 114 mg/dL (ref >39)
LDL Calculated: 85 mg/dL (ref 0–99)
Triglycerides: 59 mg/dL (ref 0–149)
VLDL CHOLESTEROL CAL: 12 mg/dL (ref 5–40)

## 2016-07-22 LAB — THYROID PANEL WITH TSH
Free Thyroxine Index: 1.8 (ref 1.2–4.9)
T3 Uptake Ratio: 27 % (ref 24–39)
T4 TOTAL: 6.8 ug/dL (ref 4.5–12.0)
TSH: 5.28 u[IU]/mL — ABNORMAL HIGH (ref 0.450–4.500)

## 2016-07-22 MED ORDER — LEVOTHYROXINE SODIUM 25 MCG PO TABS: ORAL_TABLET | ORAL | 1 refills | Status: DC

## 2016-07-27 ENCOUNTER — Telehealth: Payer: Self-pay

## 2016-07-27 NOTE — Telephone Encounter (Signed)
Ok just let patient know insurance will not cover for her constipation.

## 2016-08-04 DIAGNOSIS — E109 Type 1 diabetes mellitus without complications: Secondary | ICD-10-CM | POA: Diagnosis not present

## 2016-08-26 ENCOUNTER — Other Ambulatory Visit: Payer: Self-pay | Admitting: Nurse Practitioner

## 2016-08-26 DIAGNOSIS — K5900 Constipation, unspecified: Secondary | ICD-10-CM

## 2016-08-27 ENCOUNTER — Ambulatory Visit (INDEPENDENT_AMBULATORY_CARE_PROVIDER_SITE_OTHER): Payer: Self-pay

## 2016-08-27 ENCOUNTER — Ambulatory Visit (INDEPENDENT_AMBULATORY_CARE_PROVIDER_SITE_OTHER): Payer: BLUE CROSS/BLUE SHIELD | Admitting: Orthopaedic Surgery

## 2016-08-27 ENCOUNTER — Encounter (INDEPENDENT_AMBULATORY_CARE_PROVIDER_SITE_OTHER): Payer: Self-pay | Admitting: Orthopaedic Surgery

## 2016-08-27 VITALS — BP 110/66 | HR 71 | Ht 67.0 in | Wt 152.0 lb

## 2016-08-27 DIAGNOSIS — M542 Cervicalgia: Secondary | ICD-10-CM | POA: Diagnosis not present

## 2016-08-27 DIAGNOSIS — M25511 Pain in right shoulder: Secondary | ICD-10-CM

## 2016-08-27 DIAGNOSIS — M25522 Pain in left elbow: Secondary | ICD-10-CM | POA: Diagnosis not present

## 2016-08-27 DIAGNOSIS — G8929 Other chronic pain: Secondary | ICD-10-CM

## 2016-08-27 NOTE — Progress Notes (Signed)
Office Visit Note   Patient: Alejandra Mcdonald           Date of Birth: 1961-05-28           MRN: [id number] Visit Date: 08/27/2016              Requested by: Chevis Pretty, [place], [place] [address] PCP: Chevis Pretty, FNP   Assessment & Plan: Visit Diagnoses:  1. Neck pain   2. Chronic right shoulder pain   3. Pain in left elbow     Plan: Left tennis elbow band applied for lateral epicondylitis. Subacromial injection gave her temporarily good relief with subacromial injection lateral approach. If she has persistent problems she'll call and let us know we can consider diagnostic imaging. Portion of her pain in her shoulder may be related to her cervical spine with spondylosis. She'll return if she has increased or continued symptoms. She has an insulin pump and will follow her sugars carefully and understands that she may have some hyperglycemia and potential for increase insulin demand for the next 5-7 days. Follow-Up Instructions: Return if symptoms worsen or fail to improve.   Orders:  Orders Placed This Encounter  Procedures  . Large Joint Injection/Arthrocentesis  . XR Cervical Spine 2 or 3 views  . XR Shoulder Right  . XR Elbow 2 Views Left   No orders of the defined types were placed in this encounter.     Procedures: Large Joint Inj Date/Time: 08/28/2016 1:36 PM Performed by: Marybelle Killings Authorized by: Rodell Perna C   Consent Given by:  Patient Indications:  Pain Location:  Shoulder Site:  R subacromial bursa Needle Size:  22 G Needle Length:  1.5 inches Ultrasound Guidance: No   Fluoroscopic Guidance: No   Arthrogram: No   Medications:  1 mL lidocaine 1 %; 40 mg methylPREDNISolone acetate 40 MG/ML; 4 mL bupivacaine 0.25 % Patient tolerance:  Patient tolerated the procedure well with no immediate complications     Clinical Data: No additional findings.   Subjective: Chief Complaint  Patient presents with  . Neck  - Pain  . Left Elbow - Pain  . Right Shoulder - Pain    Patient presents today with multiple complaints. She has pain in her neck x 2 months. The pain is more right lateral side and down into her right shoulder. She is also describing right shoulder pain x 5 years. She has pain in the left elbow that she has had x 3 weeks. She has used ibuprofen, ice, heating massager, and [place] gel.    Review of Systems  Constitutional: Negative for chills and diaphoresis.  HENT: Negative for ear discharge, ear pain and nosebleeds.   Eyes: Negative for discharge and visual disturbance.  Respiratory: Negative for cough, choking and shortness of breath.   Cardiovascular: Negative for chest pain and palpitations.  Gastrointestinal: Negative for abdominal distention and abdominal pain.  Endocrine: Negative for cold intolerance and heat intolerance.  Genitourinary: Negative for flank pain and hematuria.  Musculoskeletal:       Positive for recent neck pain and elbow pain and shoulder pain.  Skin: Negative for rash and wound.  Neurological: Negative for seizures and speech difficulty.  Hematological: Negative for adenopathy. Does not bruise/bleed easily.  Psychiatric/Behavioral: Negative for agitation and suicidal ideas.     Objective: Vital Signs: BP 110/66   Pulse 71   Ht 5\' 7"  (1.702 m)   Wt 152 lb (68.9 kg)  BMI 23.81 kg/m   Physical Exam  Constitutional: She is oriented to person, place, and time. She appears well-developed.  HENT:  Head: Normocephalic.  Right Ear: External ear normal.  Left Ear: External ear normal.  Eyes: Pupils are equal, round, and reactive to light.  Neck: No tracheal deviation present. No thyromegaly present.  Cardiovascular: Normal rate.   Pulmonary/Chest: Effort normal.  Abdominal: Soft.  Musculoskeletal:  Patient has left lateral epicondylar tenderness. Pain with resisted extension. Pain is reproduced with pressure over the lateral epicondylar insertion site.  No elbow effusion elbow has good range of motion. Right shoulder shows positive impingement with internal rotation. Long of the biceps is symmetrically mildly tender. Mild brachial plexus tenderness. Upper extremity reflexes are 2+ and symmetrical normal lower extremity reflexes. Wrist flexion extension biceps triceps brachioradialis are normal to manual testing. Normal heel toe gait no long track signs.  Neurological: She is alert and oriented to person, place, and time.  Skin: Skin is warm and dry.  Psychiatric: She has a normal mood and affect. Her behavior is normal.    Ortho Exam negative Spurling negative Lhermitte. Pulses are 2+. No evidence of peripheral nerve entrapment forearm median nerve median ulnar nerve at the wrist and ulnar nerve at the elbow.  Specialty Comments:  No specialty comments available.  Imaging: Xr Elbow 2 Views Left  Result Date: 08/27/2016 Two-view x-rays left elbow shows no palpable arthritis normal bony architecture no soft tissue desiccation. Impression: Normal left elbow  Xr Cervical Spine 2 Or 3 Views  Result Date: 08/28/2016 AP lateral C-spine x-rays obtained. This shows some mid cervical disc space narrowing and spurring with some loss of normal curvature. Impression: Mild cervical spondylosis changes without spondylolisthesis. No congenital deformities, negative for fracture.  Xr Shoulder Right  Result Date: 08/28/2016 Two-view x-rays right shoulder shows no glenohumeral arthritis. Before meals joint is normal. Mild lateral downsloping of the acromion. Impression: Shoulder x-ray show no glenohumeral arthritic changes no soft tissue calcification.    PMFS History: Patient Active Problem List   Diagnosis Date Noted  . Pain in left elbow 08/28/2016  . Chronic right shoulder pain 08/28/2016  . Neck pain 08/28/2016  . Constipation 04/10/2013  . Hypothyroidism 11/18/2012  . Hyperlipidemia with target LDL less than 100 11/18/2012  . Type 1 diabetes  mellitus with target hemoglobin A1c of less than 7.5 percent ([place]) 11/18/2012   Past Medical History:  Diagnosis Date  . Arthritis   . Asthma    as child   no problem in 3 years  . Cataract    bilateral  . Diabetes mellitus without complication ([place])   . Hyperlipidemia   . Hypothyroidism   . PONV (postoperative nausea and vomiting)   . Thyroid disease   . Vasomotor phenomenon     Family History  Problem Relation Age of Onset  . Hypertension Mother   . Diabetes Mother   . Hypertension Father   . Healthy Brother   . Healthy Brother   . Healthy Brother     Past Surgical History:  Procedure Laterality Date  . APPENDECTOMY    . BILATERAL CARPAL TUNNEL RELEASE    . CATARACT EXTRACTION Bilateral may & june 2017  . CESAREAN SECTION    . TUBAL LIGATION     Social History   Occupational History  . Not on file.   Social History Main Topics  . Smoking status: Never Smoker  . Smokeless tobacco: Not on file  . Alcohol use No  .  Drug use: No  . Sexual activity: Not on file       

## 2016-08-28 DIAGNOSIS — M25511 Pain in right shoulder: Secondary | ICD-10-CM | POA: Diagnosis not present

## 2016-08-28 DIAGNOSIS — G8929 Other chronic pain: Secondary | ICD-10-CM | POA: Diagnosis not present

## 2016-08-28 DIAGNOSIS — M542 Cervicalgia: Secondary | ICD-10-CM | POA: Insufficient documentation

## 2016-08-28 DIAGNOSIS — M25522 Pain in left elbow: Secondary | ICD-10-CM | POA: Insufficient documentation

## 2016-08-28 MED ORDER — METHYLPREDNISOLONE ACETATE 40 MG/ML IJ SUSP
40.0000 mg | INTRAMUSCULAR | Status: AC | PRN
  Administered 2016-08-28: 40 mg via INTRA_ARTICULAR

## 2016-08-28 MED ORDER — LIDOCAINE HCL 1 % IJ SOLN
1.0000 mL | INTRAMUSCULAR | Status: AC | PRN
  Administered 2016-08-28: 1 mL

## 2016-08-28 MED ORDER — BUPIVACAINE HCL 0.25 % IJ SOLN
4.0000 mL | INTRAMUSCULAR | Status: AC | PRN
  Administered 2016-08-28: 4 mL via INTRA_ARTICULAR

## 2016-09-21 ENCOUNTER — Other Ambulatory Visit: Payer: Self-pay | Admitting: Nurse Practitioner

## 2016-09-21 DIAGNOSIS — E039 Hypothyroidism, unspecified: Secondary | ICD-10-CM

## 2016-10-21 DIAGNOSIS — D485 Neoplasm of uncertain behavior of skin: Secondary | ICD-10-CM | POA: Diagnosis not present

## 2016-10-21 DIAGNOSIS — L570 Actinic keratosis: Secondary | ICD-10-CM | POA: Diagnosis not present

## 2016-10-21 DIAGNOSIS — K130 Diseases of lips: Secondary | ICD-10-CM | POA: Diagnosis not present

## 2016-11-05 DIAGNOSIS — D485 Neoplasm of uncertain behavior of skin: Secondary | ICD-10-CM | POA: Diagnosis not present

## 2016-11-20 ENCOUNTER — Encounter: Payer: Self-pay | Admitting: Nurse Practitioner

## 2016-11-20 ENCOUNTER — Ambulatory Visit (INDEPENDENT_AMBULATORY_CARE_PROVIDER_SITE_OTHER): Payer: BLUE CROSS/BLUE SHIELD | Admitting: Nurse Practitioner

## 2016-11-20 VITALS — BP 116/66 | HR 72 | Temp 97.3°F | Ht 67.0 in | Wt 157.4 lb

## 2016-11-20 DIAGNOSIS — K5900 Constipation, unspecified: Secondary | ICD-10-CM | POA: Diagnosis not present

## 2016-11-20 DIAGNOSIS — E785 Hyperlipidemia, unspecified: Secondary | ICD-10-CM | POA: Diagnosis not present

## 2016-11-20 DIAGNOSIS — K649 Unspecified hemorrhoids: Secondary | ICD-10-CM

## 2016-11-20 DIAGNOSIS — E039 Hypothyroidism, unspecified: Secondary | ICD-10-CM

## 2016-11-20 DIAGNOSIS — E109 Type 1 diabetes mellitus without complications: Secondary | ICD-10-CM

## 2016-11-20 LAB — BAYER DCA HB A1C WAIVED: HB A1C (BAYER DCA - WAIVED): 7.3 % — ABNORMAL HIGH (ref <7.0)

## 2016-11-20 MED ORDER — POLYETHYLENE GLYCOL 3350 17 GM/SCOOP PO POWD: ORAL | 2 refills | Status: DC

## 2016-11-20 NOTE — Patient Instructions (Signed)

## 2016-11-20 NOTE — Progress Notes (Signed)
Subjective:    Patient ID: Alejandra Mcdonald, female    DOB: May 11, 1961, 56 y.o.   MRN: [id number]  HPI Alejandra Mcdonald is here today for follow up of chronic medical problem.  Outpatient Encounter Prescriptions as of 11/20/2016  Medication Sig  . aspirin 81 MG tablet Take 81 mg by mouth daily.    . calcium citrate-vitamin D 200-200 MG-UNIT TABS Take 1 tablet by mouth 2 (two) times daily.   . insulin aspart (NOVOLOG) 100 UNIT/ML injection Per pump up to 100 units per day  . levothyroxine (SYNTHROID, LEVOTHROID) 125 MCG tablet TAKE 1 TABLET (125 MCG TOTAL) BY MOUTH DAILY.  Marland Kitchen levothyroxine (SYNTHROID, LEVOTHROID) 25 MCG tablet 1 po on Wednesdays and Sundays  . Multiple Vitamins-Minerals (MULTIVITAMIN,TX-MINERALS) tablet Take 1 tablet by mouth daily.    Alejandra Mcdonald Tosylate (SYMPROIC) 0.2 MG TABS Take 1 tablet by mouth daily. (Patient not taking: Reported on 08/27/2016)  . polyethylene glycol powder (GLYCOLAX/MIRALAX) powder MIX 17GRAMS (1 CAPFUL) WITH 8 OUNCES OF LIQUID AND DRINK BY MOUTH  twice daily DAILY (Patient not taking: Reported on 08/27/2016)  . polyethylene glycol powder (GLYCOLAX/MIRALAX) powder MIX 17GRAMS (1 CAPFUL) WITH 8 OUNCES OF LIQUID AND DRINK BY MOUTH TWICE DAILY DAILY (Patient not taking: Reported on 08/27/2016)  . simvastatin (ZOCOR) 40 MG tablet Take 1 tablet (40 mg total) by mouth every evening.   No facility-administered encounter medications on file as of 11/20/2016.     1. Type 1 diabetes mellitus with target hemoglobin A1c of less than 7.5 percent ([place])  patient is type 1 diabetic- has lots of fluctuation in blood sugar- fastings are improving  2. Hyperlipidemia with target LDL less than 100  Eats low fat diet and exercises 2-3 x a week  3. Hypothyroidism, unspecified type  No problems  4. Constipation, unspecified constipation type  Is now on symproic- not helping- only has bowel movement 1x a week- she thinks that hemorrhiods are part of her problem.    New  complaints: None today    Review of Systems  Constitutional: Negative for diaphoresis.  Eyes: Negative for pain.  Respiratory: Negative for shortness of breath.   Cardiovascular: Negative for chest pain, palpitations and leg swelling.  Gastrointestinal: Negative for abdominal pain.  Endocrine: Negative for polydipsia.  Skin: Negative for rash.  Neurological: Negative for dizziness, weakness and headaches.  Hematological: Does not bruise/bleed easily.       Objective:   Physical Exam  Constitutional: She is oriented to person, place, and time. She appears well-developed and well-nourished.  HENT:  Nose: Nose normal.  Mouth/Throat: Oropharynx is clear and moist.  Eyes: EOM are normal.  Neck: Trachea normal, normal range of motion and full passive range of motion without pain. Neck supple. No JVD present. Carotid bruit is not present. No thyromegaly present.  Cardiovascular: Normal rate, regular rhythm, normal heart sounds and intact distal pulses.  Exam reveals no gallop and no friction rub.   No murmur heard. Pulmonary/Chest: Effort normal and breath sounds normal.  Abdominal: Soft. Bowel sounds are normal. She exhibits no distension and no mass. There is no tenderness.  Musculoskeletal: Normal range of motion.  Lymphadenopathy:    She has no cervical adenopathy.  Neurological: She is alert and oriented to person, place, and time. She has normal reflexes.  Skin: Skin is warm and dry.  Psychiatric: She has a normal mood and affect. Her behavior is normal. Judgment and thought content normal.   BP 116/66   Pulse  72   Temp 97.3 F (36.3 C) (Oral)   Ht _0  (1.702 m)   Wt 157 lb 6.4 oz (71.4 kg)   BMI 24.65 kg/m      hgba1c 7.3% Assessment & Plan:  1. Type 1 diabetes mellitus with target hemoglobin A1c of less than 7.5 percent (HCC) Continue to watch carbs in diet - CMP14+EGFR - Bayer DCA Hb A1c Waived  2. Hyperlipidemia with target LDL less than 100 Low fat diet -  Lipid panel  3. Hypothyroidism, unspecified type - Thyroid Panel With TSH  4. Constipation, unspecified constipation type Increase fiber in diet Force fluids - polyethylene glycol powder (GLYCOLAX/MIRALAX) powder; MIX 17GRAMS (1 CAPFUL) WITH 8 OUNCES OF LIQUID AND DRINK BY MOUTH  TID  Dispense: 2295 g; Refill: 2  5. Hemorrhoids, unspecified hemorrhoid type - Ambulatory referral to General Surgery    Labs pending Health maintenance reviewed Diet and exercise encouraged Continue all meds Follow up  In 3 months   [place], FNP

## 2016-11-21 LAB — CMP14+EGFR
ALBUMIN: 4.3 g/dL (ref 3.5–5.5)
ALK PHOS: 59 IU/L (ref 39–117)
ALT: 24 IU/L (ref 0–32)
AST: 26 IU/L (ref 0–40)
Albumin/Globulin Ratio: 2.4 — ABNORMAL HIGH (ref 1.2–2.2)
BILIRUBIN TOTAL: 0.2 mg/dL (ref 0.0–1.2)
BUN / CREAT RATIO: 27 — AB (ref 9–23)
BUN: 20 mg/dL (ref 6–24)
CHLORIDE: 99 mmol/L (ref 96–106)
CO2: 29 mmol/L (ref 18–29)
CREATININE: 0.75 mg/dL (ref 0.57–1.00)
Calcium: 9.4 mg/dL (ref 8.7–10.2)
GFR calc Af Amer: 103 mL/min/{1.73_m2} (ref >59)
GFR calc non Af Amer: 89 mL/min/{1.73_m2} (ref >59)
GLUCOSE: 59 mg/dL — AB (ref 65–99)
Globulin, Total: 1.8 g/dL (ref 1.5–4.5)
Potassium: 4.2 mmol/L (ref 3.5–5.2)
Sodium: 141 mmol/L (ref 134–144)
Total Protein: 6.1 g/dL (ref 6.0–8.5)

## 2016-11-21 LAB — LIPID PANEL
CHOL/HDL RATIO: 2 ratio (ref 0.0–4.4)
Cholesterol, Total: 201 mg/dL — ABNORMAL HIGH (ref 100–199)
HDL: 102 mg/dL (ref >39)
LDL CALC: 85 mg/dL (ref 0–99)
TRIGLYCERIDES: 69 mg/dL (ref 0–149)
VLDL CHOLESTEROL CAL: 14 mg/dL (ref 5–40)

## 2016-11-21 LAB — THYROID PANEL WITH TSH
FREE THYROXINE INDEX: 2.7 (ref 1.2–4.9)
T3 Uptake Ratio: 31 % (ref 24–39)
T4, Total: 8.7 ug/dL (ref 4.5–12.0)
TSH: 2.35 u[IU]/mL (ref 0.450–4.500)

## 2016-12-10 ENCOUNTER — Ambulatory Visit: Payer: BLUE CROSS/BLUE SHIELD | Admitting: General Surgery

## 2016-12-15 ENCOUNTER — Ambulatory Visit: Payer: BLUE CROSS/BLUE SHIELD | Admitting: General Surgery

## 2016-12-15 ENCOUNTER — Other Ambulatory Visit: Payer: Self-pay | Admitting: Nurse Practitioner

## 2016-12-15 DIAGNOSIS — E785 Hyperlipidemia, unspecified: Secondary | ICD-10-CM

## 2016-12-16 ENCOUNTER — Encounter: Payer: Self-pay | Admitting: General Surgery

## 2016-12-16 ENCOUNTER — Ambulatory Visit (INDEPENDENT_AMBULATORY_CARE_PROVIDER_SITE_OTHER): Payer: BLUE CROSS/BLUE SHIELD | Admitting: General Surgery

## 2016-12-16 VITALS — BP 129/65 | HR 89 | Temp 98.6°F | Resp 18 | Ht 67.0 in | Wt 155.0 lb

## 2016-12-16 DIAGNOSIS — K5904 Chronic idiopathic constipation: Secondary | ICD-10-CM | POA: Diagnosis not present

## 2016-12-16 NOTE — Patient Instructions (Signed)

## 2016-12-16 NOTE — Progress Notes (Signed)
Alejandra Mcdonald; [id number]; Jan 14, 1961   HPI Patient is a 56 year old white female who was referred by care for a rectal examination for possible narrowing of the anal canal and/or hemorrhoids. Patient states she has had chronic issues with constipation since she was an infant. She may have a bowel movement every 1-2 weeks. She has to take multiple cathartics to pass her stool. Initially, it does come out somewhat narrowed. She does not completely evacuate herself. She has had to give herself enemas in the past. She takes milk of magnesia, MiraLAX, stool softeners, and fiber multiple times a day. She states she has had an extensive workup in the past which did not find an etiology for her constipation. She currently has no pain. She denies any blood per rectum. She last had a colonoscopy in 2008. She was referred by Ronnald Collum for further evaluation and treatment. Past Medical History:  Diagnosis Date  . Arthritis   . Asthma    as child   no problem in 3 years  . Cataract    bilateral  . Diabetes mellitus without complication ([place])   . Hyperlipidemia   . Hypothyroidism   . PONV (postoperative nausea and vomiting)   . Thyroid disease   . Vasomotor phenomenon     Past Surgical History:  Procedure Laterality Date  . APPENDECTOMY    . BILATERAL CARPAL TUNNEL RELEASE    . CATARACT EXTRACTION Bilateral may & june 2017  . CESAREAN SECTION    . TUBAL LIGATION      Family History  Problem Relation Age of Onset  . Hypertension Mother   . Diabetes Mother   . Hypertension Father   . Healthy Brother   . Healthy Brother   . Healthy Brother     Current Outpatient Prescriptions on File Prior to Visit  Medication Sig Dispense Refill  . aspirin 81 MG tablet Take 81 mg by mouth daily.      . calcium citrate-vitamin D 200-200 MG-UNIT TABS Take 1 tablet by mouth 2 (two) times daily.     . insulin aspart (NOVOLOG) 100 UNIT/ML injection Per pump up to 100 units per day 3 vial 11  . levothyroxine  (SYNTHROID, LEVOTHROID) 125 MCG tablet TAKE 1 TABLET (125 MCG TOTAL) BY MOUTH DAILY. 90 tablet 2  . Multiple Vitamins-Minerals (MULTIVITAMIN,TX-MINERALS) tablet Take 1 tablet by mouth daily.      . polyethylene glycol powder (GLYCOLAX/MIRALAX) powder MIX 17GRAMS (1 CAPFUL) WITH 8 OUNCES OF LIQUID AND DRINK BY MOUTH  TID 2295 g 2  . simvastatin (ZOCOR) 40 MG tablet TAKE 1 TABLET (40 MG TOTAL) BY MOUTH EVERY EVENING. 30 tablet 1   No current facility-administered medications on file prior to visit.     Allergies  Allergen Reactions  . Ace Inhibitors Cough  . Codeine     Vomiting and nausea.  . Lipitor [Atorvastatin] Other (See Comments)    Myalgias    History  Alcohol Use No    History  Smoking Status  . Never Smoker  Smokeless Tobacco  . Never Used    Review of Systems  Constitutional: Negative.   HENT: Negative.   Eyes: Negative.   Respiratory: Negative.   Cardiovascular: Negative.   Gastrointestinal: Positive for constipation. Negative for abdominal pain and blood in stool.  Genitourinary: Negative.   Musculoskeletal: Negative.   Skin: Negative.   Neurological: Negative.   Endo/Heme/Allergies: Negative.   Psychiatric/Behavioral: Negative.     Objective   Vitals:   12/16/16 1435  BP: 129/65  Pulse: 89  Resp: 18  Temp: 98.6 F (37 C)    Physical Exam  Constitutional: She is well-developed, well-nourished, and in no distress.  HENT:  Head: Normocephalic and atraumatic.  Neck: Normal range of motion. Neck supple.  Cardiovascular: Normal rate, regular rhythm and normal heart sounds.   Pulmonary/Chest: Effort normal. She has no wheezes. She has no rales.  Abdominal: Soft. Bowel sounds are normal. She exhibits no distension. There is no tenderness.  Genitourinary:  Genitourinary Comments: Rectal examination reveals no external hemorrhoids. Digital examination easily allowed my index finger. I did not sense that she had any stricture. Her sphincter tone was  good. No anal fissure was noted. I did not palpate an internal hemorrhoid. No blood noted.  Vitals reviewed.   Assessment   Chronic constipation, idiopathic Plan   I told the patient that I did not find anything that would require surgical intervention at this time. I did not feel she had any significant stricture formation and would not require a sphincterotomy. She understood and agreed. We did discuss biofeedback in hopes that this may help her with her defecation issues. She states she has been to [facility] in the past, and does not want to go back at this time. We also discussed possibly tapering off some of her cathartics so that she does not become laxative intolerant. I told her to give me a call and return should she have any problems. She understood this and agreed. Follow-up as needed.

## 2017-01-12 DIAGNOSIS — E109 Type 1 diabetes mellitus without complications: Secondary | ICD-10-CM | POA: Diagnosis not present

## 2017-01-12 DIAGNOSIS — Z794 Long term (current) use of insulin: Secondary | ICD-10-CM | POA: Diagnosis not present

## 2017-02-02 ENCOUNTER — Other Ambulatory Visit: Payer: Self-pay | Admitting: Nurse Practitioner

## 2017-02-02 DIAGNOSIS — E785 Hyperlipidemia, unspecified: Secondary | ICD-10-CM

## 2017-03-22 ENCOUNTER — Encounter: Payer: Self-pay | Admitting: Nurse Practitioner

## 2017-03-22 ENCOUNTER — Ambulatory Visit (INDEPENDENT_AMBULATORY_CARE_PROVIDER_SITE_OTHER): Payer: BLUE CROSS/BLUE SHIELD | Admitting: Nurse Practitioner

## 2017-03-22 VITALS — BP 120/71 | HR 72 | Temp 98.5°F | Ht 67.0 in | Wt 159.0 lb

## 2017-03-22 DIAGNOSIS — E109 Type 1 diabetes mellitus without complications: Secondary | ICD-10-CM

## 2017-03-22 DIAGNOSIS — E785 Hyperlipidemia, unspecified: Secondary | ICD-10-CM

## 2017-03-22 DIAGNOSIS — E039 Hypothyroidism, unspecified: Secondary | ICD-10-CM

## 2017-03-22 DIAGNOSIS — K5904 Chronic idiopathic constipation: Secondary | ICD-10-CM | POA: Diagnosis not present

## 2017-03-22 DIAGNOSIS — K5900 Constipation, unspecified: Secondary | ICD-10-CM | POA: Diagnosis not present

## 2017-03-22 LAB — BAYER DCA HB A1C WAIVED: HB A1C: 7.2 % — AB (ref <7.0)

## 2017-03-22 MED ORDER — SIMVASTATIN 40 MG PO TABS: 40.0000 mg | ORAL_TABLET | Freq: Every evening | ORAL | 3 refills | Status: DC

## 2017-03-22 MED ORDER — LEVOTHYROXINE SODIUM 125 MCG PO TABS: 125.0000 ug | ORAL_TABLET | Freq: Every day | ORAL | 2 refills | Status: DC

## 2017-03-22 MED ORDER — INSULIN ASPART 100 UNIT/ML ~~LOC~~ SOLN: SUBCUTANEOUS | 11 refills | Status: DC

## 2017-03-22 MED ORDER — POLYETHYLENE GLYCOL 3350 17 GM/SCOOP PO POWD: ORAL | 2 refills | Status: DC

## 2017-03-22 NOTE — Patient Instructions (Signed)

## 2017-03-22 NOTE — Progress Notes (Signed)
 Subjective:    Patient ID: Alejandra Mcdonald, female    DOB: 03/09/1961, 56 y.o.   MRN: [id number]  HPI   Alejandra Mcdonald is here today for follow up of chronic medical problem.  Outpatient Encounter Prescriptions as of 03/22/2017  Medication Sig  . aspirin 81 MG tablet Take 81 mg by mouth daily.    . calcium citrate-vitamin D 200-200 MG-UNIT TABS Take 1 tablet by mouth 2 (two) times daily.   . insulin aspart (NOVOLOG) 100 UNIT/ML injection Per pump up to 100 units per day  . levothyroxine (SYNTHROID, LEVOTHROID) 125 MCG tablet TAKE 1 TABLET (125 MCG TOTAL) BY MOUTH DAILY.  . Multiple Vitamins-Minerals (MULTIVITAMIN,TX-MINERALS) tablet Take 1 tablet by mouth daily.    . polyethylene glycol powder (GLYCOLAX/MIRALAX) powder MIX 17GRAMS (1 CAPFUL) WITH 8 OUNCES OF LIQUID AND DRINK BY MOUTH  TID  . simvastatin (ZOCOR) 40 MG tablet TAKE 1 TABLET (40 MG TOTAL) BY MOUTH EVERY EVENING.   No facility-administered encounter medications on file as of 03/22/2017.     1. Type 1 diabetes mellitus with target hemoglobin A1c of less than 7.5 percent (HCC)insulin dependent diabetic on pump . Last hgba1c was 7.3%. Blood sugars have I=been in 120-150 on average. No hypoglycemic episodes as of late  2. Hyperlipidemia with target LDL less than 100  Watches fats in diet  3. Chronic idiopathic constipation  has had constipation all her life. D+SHe uses different things to go to bathroom, still only goes 1-2 x a week. She takes miralax daily  4. Hypothyroidism, unspecified type  no problems that she is aware of.     New complaints: None today  Social history: Exercises regualrly   Review of Systems  Constitutional: Negative for activity change and appetite change.  HENT: Negative.   Eyes: Negative for pain.  Respiratory: Negative for shortness of breath.   Cardiovascular: Negative for chest pain, palpitations and leg swelling.  Gastrointestinal: Negative for abdominal pain.  Endocrine: Negative for  polydipsia.  Genitourinary: Negative.   Skin: Negative for rash.  Neurological: Negative for dizziness, weakness and headaches.  Hematological: Does not bruise/bleed easily.  Psychiatric/Behavioral: Negative.   All other systems reviewed and are negative.      Objective:   Physical Exam  Constitutional: She is oriented to person, place, and time. She appears well-developed and well-nourished.  HENT:  Nose: Nose normal.  Mouth/Throat: Oropharynx is clear and moist.  Eyes: EOM are normal.  Neck: Trachea normal, normal range of motion and full passive range of motion without pain. Neck supple. No JVD present. Carotid bruit is not present. No thyromegaly present.  Cardiovascular: Normal rate, regular rhythm, normal heart sounds and intact distal pulses.  Exam reveals no gallop and no friction rub.   No murmur heard. Pulmonary/Chest: Effort normal and breath sounds normal.  Abdominal: Soft. Bowel sounds are normal. She exhibits no distension and no mass. There is no tenderness.  Musculoskeletal: Normal range of motion.  Lymphadenopathy:    She has no cervical adenopathy.  Neurological: She is alert and oriented to person, place, and time. She has normal reflexes.  Skin: Skin is warm and dry.  Psychiatric: She has a normal mood and affect. Her behavior is normal. Judgment and thought content normal.   BP 120/71   Pulse 72   Temp 98.5 F (36.9 C) (Oral)   Ht 5' 7" (1.702 m)   Wt 159 lb (72.1 kg)   BMI 24.90 kg/m         Assessment & Plan:  1. Type 1 diabetes mellitus with target hemoglobin A1c of less than 7.5 percent (HCC) Continue to watch carbs in diet - Bayer DCA Hb A1c Waived - CMP14+EGFR - insulin aspart (NOVOLOG) 100 UNIT/ML injection; Per pump up to 100 units per day  Dispense: 3 vial; Refill: 11  2. Hyperlipidemia with target LDL less than 100 Low fat diet - Lipid panel - simvastatin (ZOCOR) 40 MG tablet; Take 1 tablet (40 mg total) by mouth every evening.  Dispense:  30 tablet; Refill: 3  3. Chronic idiopathic constipation - polyethylene glycol powder (GLYCOLAX/MIRALAX) powder; MIX 17GRAMS (1 CAPFUL) WITH 8 OUNCES OF LIQUID AND DRINK BY MOUTH  TID  Dispense: 2295 g; Refill: 2   4. Hypothyroidism, unspecified type - levothyroxine (SYNTHROID, LEVOTHROID) 125 MCG tablet; Take 1 tablet (125 mcg total) by mouth daily.  Dispense: 90 tablet; Refill: 2  Wants to wait until first of year before doing colonoscopy Labs pending Health maintenance reviewed Diet and exercise encouraged Continue all meds Follow up  In 3 months   [place], FNP

## 2017-03-23 LAB — CMP14+EGFR
A/G RATIO: 2.4 — AB (ref 1.2–2.2)
ALK PHOS: 103 IU/L (ref 39–117)
ALT: 40 IU/L — ABNORMAL HIGH (ref 0–32)
AST: 33 IU/L (ref 0–40)
Albumin: 4.5 g/dL (ref 3.5–5.5)
BILIRUBIN TOTAL: 0.3 mg/dL (ref 0.0–1.2)
BUN/Creatinine Ratio: 8 — ABNORMAL LOW (ref 9–23)
BUN: 7 mg/dL (ref 6–24)
CHLORIDE: 99 mmol/L (ref 96–106)
CO2: 29 mmol/L (ref 20–29)
Calcium: 10.2 mg/dL (ref 8.7–10.2)
Creatinine, Ser: 0.88 mg/dL (ref 0.57–1.00)
GFR calc Af Amer: 84 mL/min/{1.73_m2} (ref >59)
GFR calc non Af Amer: 73 mL/min/{1.73_m2} (ref >59)
GLOBULIN, TOTAL: 1.9 g/dL (ref 1.5–4.5)
Glucose: 110 mg/dL — ABNORMAL HIGH (ref 65–99)
Potassium: 4.6 mmol/L (ref 3.5–5.2)
SODIUM: 142 mmol/L (ref 134–144)
Total Protein: 6.4 g/dL (ref 6.0–8.5)

## 2017-03-23 LAB — LIPID PANEL
CHOLESTEROL TOTAL: 211 mg/dL — AB (ref 100–199)
Chol/HDL Ratio: 2.3 ratio (ref 0.0–4.4)
HDL: 92 mg/dL (ref >39)
LDL Calculated: 96 mg/dL (ref 0–99)
TRIGLYCERIDES: 115 mg/dL (ref 0–149)
VLDL Cholesterol Cal: 23 mg/dL (ref 5–40)

## 2017-04-15 DIAGNOSIS — H43811 Vitreous degeneration, right eye: Secondary | ICD-10-CM | POA: Diagnosis not present

## 2017-05-17 DIAGNOSIS — H43811 Vitreous degeneration, right eye: Secondary | ICD-10-CM | POA: Diagnosis not present

## 2017-05-27 DIAGNOSIS — E109 Type 1 diabetes mellitus without complications: Secondary | ICD-10-CM | POA: Diagnosis not present

## 2017-07-01 DIAGNOSIS — Z23 Encounter for immunization: Secondary | ICD-10-CM | POA: Diagnosis not present

## 2017-07-06 ENCOUNTER — Ambulatory Visit: Payer: BLUE CROSS/BLUE SHIELD | Admitting: Nurse Practitioner

## 2017-07-06 DIAGNOSIS — D485 Neoplasm of uncertain behavior of skin: Secondary | ICD-10-CM | POA: Diagnosis not present

## 2017-07-06 DIAGNOSIS — L280 Lichen simplex chronicus: Secondary | ICD-10-CM | POA: Diagnosis not present

## 2017-08-12 ENCOUNTER — Ambulatory Visit: Payer: BLUE CROSS/BLUE SHIELD | Admitting: Family

## 2017-08-12 ENCOUNTER — Encounter: Payer: Self-pay | Admitting: Family

## 2017-08-12 VITALS — BP 132/72 | HR 70 | Temp 97.6°F | Ht 67.0 in | Wt 165.0 lb

## 2017-08-12 DIAGNOSIS — R42 Dizziness and giddiness: Secondary | ICD-10-CM | POA: Diagnosis not present

## 2017-08-12 DIAGNOSIS — J301 Allergic rhinitis due to pollen: Secondary | ICD-10-CM | POA: Diagnosis not present

## 2017-08-12 MED ORDER — FLUTICASONE PROPIONATE 50 MCG/ACT NA SUSP: 2.0000 | Freq: Every day | NASAL | 6 refills | Status: DC

## 2017-08-12 MED ORDER — MECLIZINE HCL 25 MG PO TABS
25.0000 mg | ORAL_TABLET | Freq: Three times a day (TID) | ORAL | 0 refills | Status: DC | PRN
Start: 2017-08-12 — End: 2017-10-26

## 2017-08-12 NOTE — Patient Instructions (Signed)
Vertigo °Vertigo is the feeling that you or your surroundings are moving when they are not. Vertigo can be dangerous if it occurs while you are doing something that could endanger you or others, such as driving. °What are the causes? °This condition is caused by a disturbance in the signals that are sent by your body’s sensory systems to your brain. Different causes of a disturbance can lead to vertigo, including: °· Infections, especially in the inner ear. °· A bad reaction to a drug, or misuse of alcohol and medicines. °· Withdrawal from drugs or alcohol. °· Quickly changing positions, as when lying down or rolling over in bed. °· Migraine headaches. °· Decreased blood flow to the brain. °· Decreased blood pressure. °· Increased pressure in the brain from a head or neck injury, stroke, infection, tumor, or bleeding. °· Central nervous system disorders. ° °What are the signs or symptoms? °Symptoms of this condition usually occur when you move your head or your eyes in different directions. Symptoms may start suddenly, and they usually last for less than a minute. Symptoms may include: °· Loss of balance and falling. °· Feeling like you are spinning or moving. °· Feeling like your surroundings are spinning or moving. °· Nausea and vomiting. °· Blurred vision or double vision. °· Difficulty hearing. °· Slurred speech. °· Dizziness. °· Involuntary eye movement (nystagmus). ° °Symptoms can be mild and cause only slight annoyance, or they can be severe and interfere with daily life. Episodes of vertigo may return (recur) over time, and they are often triggered by certain movements. Symptoms may improve over time. °How is this diagnosed? °This condition may be diagnosed based on medical history and the quality of your nystagmus. Your health care provider may test your eye movements by asking you to quickly change positions to trigger the nystagmus. This may be called the Dix-Hallpike test, head thrust test, or roll test.  You may be referred to a health care provider who specializes in ear, nose, and throat (ENT) problems (otolaryngologist) or a provider who specializes in disorders of the central nervous system (neurologist). °You may have additional testing, including: °· A physical exam. °· Blood tests. °· MRI. °· A CT scan. °· An electrocardiogram (ECG). This records electrical activity in your heart. °· An electroencephalogram (EEG). This records electrical activity in your brain. °· Hearing tests. ° °How is this treated? °Treatment for this condition depends on the cause and the severity of the symptoms. Treatment options include: °· Medicines to treat nausea or vertigo. These are usually used for severe cases. Some medicines that are used to treat other conditions may also reduce or eliminate vertigo symptoms. These include: °? Medicines that control allergies (antihistamines). °? Medicines that control seizures (anticonvulsants). °? Medicines that relieve depression (antidepressants). °? Medicines that relieve anxiety (sedatives). °· Head movements to adjust your inner ear back to normal. If your vertigo is caused by an ear problem, your health care provider may recommend certain movements to correct the problem. °· Surgery. This is rare. ° °Follow these instructions at home: °Safety °· Move slowly.Avoid sudden body or head movements. °· Avoid driving. °· Avoid operating heavy machinery. °· Avoid doing any tasks that would cause danger to you or others if you would have a vertigo episode during the task. °· If you have trouble walking or keeping your balance, try using a cane for stability. If you feel dizzy or unstable, sit down right away. °· Return to your normal activities as told by your   health care provider. Ask your health care provider what activities are safe for you. °General instructions °· Take over-the-counter and prescription medicines only as told by your health care provider. °· Avoid certain positions or  movements as told by your health care provider. °· Drink enough fluid to keep your urine clear or pale yellow. °· Keep all follow-up visits as told by your health care provider. This is important. °Contact a health care provider if: °· Your medicines do not relieve your vertigo or they make it worse. °· You have a fever. °· Your condition gets worse or you develop new symptoms. °· Your family or friends notice any behavioral changes. °· Your nausea or vomiting gets worse. °· You have numbness or a “pins and needles” sensation in part of your body. °Get help right away if: °· You have difficulty moving or speaking. °· You are always dizzy. °· You faint. °· You develop severe headaches. °· You have weakness in your hands, arms, or legs. °· You have changes in your hearing or vision. °· You develop a stiff neck. °· You develop sensitivity to light. °This information is not intended to replace advice given to you by your health care provider. Make sure you discuss any questions you have with your health care provider. °Document Released: 05/13/2005 Document Revised: 01/15/2016 Document Reviewed: 11/26/2014 °Elsevier Interactive Patient Education © 2018 [employer] ° °

## 2017-08-12 NOTE — Progress Notes (Signed)
   Subjective:    Patient ID: Alejandra Mcdonald, female    DOB: 01-Sep-1960, 56 y.o.   MRN: [id number]  Dizziness  This is a new problem. The current episode started 1 to 4 weeks ago. The problem occurs intermittently. The problem has been waxing and waning. Associated symptoms include congestion, coughing, nausea, a sore throat, swollen glands and vertigo. Pertinent negatives include no chills or headaches. The symptoms are aggravated by sneezing and bending. She has tried nothing for the symptoms. The treatment provided no relief.      Review of Systems  Constitutional: Negative for chills.  HENT: Positive for congestion and sore throat.   Respiratory: Positive for cough.   Gastrointestinal: Positive for nausea.  Neurological: Positive for dizziness and vertigo. Negative for headaches.  All other systems reviewed and are negative.      Objective:   Physical Exam  Constitutional: She is oriented to person, place, and time. She appears well-developed and well-nourished. No distress.  HENT:  Head: Normocephalic and atraumatic.  Right Ear: External ear normal.  Left Ear: External ear normal.  Nose: Mucosal edema and rhinorrhea present. Right sinus exhibits no maxillary sinus tenderness and no frontal sinus tenderness. Left sinus exhibits no maxillary sinus tenderness and no frontal sinus tenderness.  Mouth/Throat: Posterior oropharyngeal erythema present.  Eyes: Pupils are equal, round, and reactive to light.  Neck: Normal range of motion. Neck supple. No thyromegaly present.  Cardiovascular: Normal rate, regular rhythm, normal heart sounds and intact distal pulses.  No murmur heard. Pulmonary/Chest: Effort normal and breath sounds normal. No respiratory distress. She has no wheezes.  Abdominal: Soft. Bowel sounds are normal. She exhibits no distension. There is no tenderness.  Musculoskeletal: Normal range of motion. She exhibits no edema or tenderness.  Neurological: She is alert and  oriented to person, place, and time. She has normal reflexes. No cranial nerve deficit.  Skin: Skin is warm and dry.  Psychiatric: She has a normal mood and affect. Her behavior is normal. Judgment and thought content normal.  Vitals reviewed.   BP 132/72   Pulse 70   Temp 97.6 F (36.4 C) (Oral)   Ht 5\' 7"  (1.702 m)   Wt 165 lb (74.8 kg)   BMI 25.84 kg/m        Assessment & Plan:  1. Allergic rhinitis due to pollen, unspecified seasonality Start daily zyrtec  Avoid allergens  - fluticasone (FLONASE) 50 MCG/ACT nasal spray; Place 2 sprays into both nostrils daily.  Dispense: 16 g; Refill: 6  2. Vertigo Fall preventions discussed RTO prn or if symptoms do not improve or worsen - meclizine (ANTIVERT) 25 MG tablet; Take 1 tablet (25 mg total) by mouth 3 (three) times daily as needed for dizziness.  Dispense: 30 tablet; Refill: 0    Evelina Dun, FNP

## 2017-09-07 ENCOUNTER — Ambulatory Visit: Payer: BLUE CROSS/BLUE SHIELD | Admitting: Nurse Practitioner

## 2017-09-14 ENCOUNTER — Other Ambulatory Visit: Payer: Self-pay | Admitting: Nurse Practitioner

## 2017-09-14 ENCOUNTER — Ambulatory Visit (INDEPENDENT_AMBULATORY_CARE_PROVIDER_SITE_OTHER): Payer: BLUE CROSS/BLUE SHIELD

## 2017-09-14 ENCOUNTER — Ambulatory Visit: Payer: BLUE CROSS/BLUE SHIELD | Admitting: Nurse Practitioner

## 2017-09-14 ENCOUNTER — Encounter: Payer: Self-pay | Admitting: Nurse Practitioner

## 2017-09-14 VITALS — BP 125/68 | HR 78 | Temp 96.9°F | Ht 67.0 in | Wt 165.0 lb

## 2017-09-14 DIAGNOSIS — E039 Hypothyroidism, unspecified: Secondary | ICD-10-CM

## 2017-09-14 DIAGNOSIS — Z780 Asymptomatic menopausal state: Secondary | ICD-10-CM

## 2017-09-14 DIAGNOSIS — K5904 Chronic idiopathic constipation: Secondary | ICD-10-CM

## 2017-09-14 DIAGNOSIS — E785 Hyperlipidemia, unspecified: Secondary | ICD-10-CM

## 2017-09-14 DIAGNOSIS — E109 Type 1 diabetes mellitus without complications: Secondary | ICD-10-CM

## 2017-09-14 DIAGNOSIS — Z23 Encounter for immunization: Secondary | ICD-10-CM

## 2017-09-14 DIAGNOSIS — Z13820 Encounter for screening for osteoporosis: Secondary | ICD-10-CM | POA: Diagnosis not present

## 2017-09-14 LAB — BAYER DCA HB A1C WAIVED: HB A1C: 7.2 % — AB (ref <7.0)

## 2017-09-14 MED ORDER — INSULIN ASPART 100 UNIT/ML ~~LOC~~ SOLN: SUBCUTANEOUS | 11 refills | Status: DC

## 2017-09-14 MED ORDER — LEVOTHYROXINE SODIUM 125 MCG PO TABS: 125.0000 ug | ORAL_TABLET | Freq: Every day | ORAL | 2 refills | Status: DC

## 2017-09-14 MED ORDER — SIMVASTATIN 40 MG PO TABS: 40.0000 mg | ORAL_TABLET | Freq: Every evening | ORAL | 3 refills | Status: DC

## 2017-09-14 MED ORDER — FREESTYLE LIBRE 14 DAY READER DEVI: 1.0000 | Freq: Every day | 5 refills | Status: DC

## 2017-09-14 NOTE — Progress Notes (Signed)
Subjective:    Patient ID: Alejandra Mcdonald, female    DOB: [birth year], 57 y.o.   MRN: [id number]  HPI Alejandra Mcdonald is here today for follow up of chronic medical problem.  Outpatient Encounter Medications as of 09/14/2017  Medication Sig  . aspirin 81 MG tablet Take 81 mg by mouth daily.    . calcium citrate-vitamin D 200-200 MG-UNIT TABS Take 1 tablet by mouth 2 (two) times daily.   . fluticasone (FLONASE) 50 MCG/ACT nasal spray Place 2 sprays into both nostrils daily.  . insulin aspart (NOVOLOG) 100 UNIT/ML injection Per pump up to 100 units per day  . levothyroxine (SYNTHROID, LEVOTHROID) 125 MCG tablet Take 1 tablet (125 mcg total) by mouth daily.  . meclizine (ANTIVERT) 25 MG tablet Take 1 tablet (25 mg total) by mouth 3 (three) times daily as needed for dizziness.  . Multiple Vitamins-Minerals (MULTIVITAMIN,TX-MINERALS) tablet Take 1 tablet by mouth daily.    . polyethylene glycol powder (GLYCOLAX/MIRALAX) powder MIX 17GRAMS (1 CAPFUL) WITH 8 OUNCES OF LIQUID AND DRINK BY MOUTH  TID  . simvastatin (ZOCOR) 40 MG tablet Take 1 tablet (40 mg total) by mouth every evening.     1. Type 1 diabetes mellitus with target hemoglobin A1c of less than 7.5 percent (HCC) Last hgba1c was  7.2%- she has an insulin pump- say sthat blood sugars seem to run all over the place but have been doing better. She checks her blood sugars 7-8 x a day.  2. Hypothyroidism, unspecified type  Not having any problems that she is aware of  3. Hyperlipidemia with target LDL less than 100  Tries to watch diet   4. Chronic idiopathic constipation  she has had this for many years. She uses miralax every day . She has tried [place] and linzes but neither have helped    New complaints: None today  Social history: Still exercises on a regular basis.   Review of Systems  Constitutional: Negative for activity change and appetite change.  HENT: Negative.   Eyes: Negative for pain.  Respiratory: Negative for  shortness of breath.   Cardiovascular: Negative for chest pain, palpitations and leg swelling.  Gastrointestinal: Negative for abdominal pain.  Endocrine: Negative for polydipsia.  Genitourinary: Negative.   Skin: Negative for rash.  Neurological: Negative for dizziness, weakness and headaches.  Hematological: Does not bruise/bleed easily.  Psychiatric/Behavioral: Negative.   All other systems reviewed and are negative.      Objective:   Physical Exam  Constitutional: She is oriented to person, place, and time. She appears well-developed and well-nourished.  HENT:  Nose: Nose normal.  Mouth/Throat: Oropharynx is clear and moist.  Eyes: EOM are normal.  Neck: Trachea normal, normal range of motion and full passive range of motion without pain. Neck supple. No JVD present. Carotid bruit is not present. No thyromegaly present.  Cardiovascular: Normal rate, regular rhythm, normal heart sounds and intact distal pulses. Exam reveals no gallop and no friction rub.  No murmur heard. Pulmonary/Chest: Effort normal and breath sounds normal.  Abdominal: Soft. Bowel sounds are normal. She exhibits no distension and no mass. There is no tenderness.  Musculoskeletal: Normal range of motion.  Lymphadenopathy:    She has no cervical adenopathy.  Neurological: She is alert and oriented to person, place, and time. She has normal reflexes.  Skin: Skin is warm and dry.  Psychiatric: She has a normal mood and affect. Her behavior is normal. Judgment and thought content normal.  BP 125/68   Pulse 78   Temp (!) 96.9 F (36.1 C) (Oral)   Ht 5' 7"  (1.702 m)   Wt 165 lb (74.8 kg)   BMI 25.84 kg/m   hgba1c 7.2% today       Assessment & Plan:  1. Type 1 diabetes mellitus with target hemoglobin A1c of less than 7.5 percent (HCC) Carb counting - Bayer DCA Hb A1c Waived - CMP14+EGFR - Microalbumin / creatinine urine ratio - Continuous Blood Gluc Receiver (FREESTYLE LIBRE 14 DAY READER) DEVI; 1  Device by Does not apply route daily.  Dispense: 2 Device; Refill: 5 - insulin aspart (NOVOLOG) 100 UNIT/ML injection; Per pump up to 100 units per day  Dispense: 3 vial; Refill: 11  2. Hypothyroidism, unspecified type - levothyroxine (SYNTHROID, LEVOTHROID) 125 MCG tablet; Take 1 tablet (125 mcg total) by mouth daily.  Dispense: 90 tablet; Refill: 2  3. Hyperlipidemia with target LDL less than 100 Low fat diet - Lipid panel - simvastatin (ZOCOR) 40 MG tablet; Take 1 tablet (40 mg total) by mouth every evening.  Dispense: 30 tablet; Refill: 3  4. Chronic idiopathic constipation Continue current regimen - Ambulatory referral to Gastroenterology    Labs pending Health maintenance reviewed Diet and exercise encouraged Continue all meds Follow up  In 3  months  [place], FNP

## 2017-09-14 NOTE — Patient Instructions (Signed)

## 2017-09-14 NOTE — Addendum Note (Signed)
Addended by: Rolena Infante on: 09/14/2017 04:50 PM   Modules accepted: Orders

## 2017-09-15 LAB — LIPID PANEL
CHOL/HDL RATIO: 2.3 ratio (ref 0.0–4.4)
Cholesterol, Total: 226 mg/dL — ABNORMAL HIGH (ref 100–199)
HDL: 100 mg/dL (ref >39)
LDL Calculated: 103 mg/dL — ABNORMAL HIGH (ref 0–99)
Triglycerides: 116 mg/dL (ref 0–149)
VLDL Cholesterol Cal: 23 mg/dL (ref 5–40)

## 2017-09-15 LAB — CMP14+EGFR
ALT: 25 IU/L (ref 0–32)
AST: 24 IU/L (ref 0–40)
Albumin/Globulin Ratio: 2.2 (ref 1.2–2.2)
Albumin: 4.6 g/dL (ref 3.5–5.5)
Alkaline Phosphatase: 65 IU/L (ref 39–117)
BUN/Creatinine Ratio: 12 (ref 9–23)
BUN: 12 mg/dL (ref 6–24)
Bilirubin Total: <0.2 mg/dL (ref 0.0–1.2)
CALCIUM: 10.2 mg/dL (ref 8.7–10.2)
CO2: 27 mmol/L (ref 20–29)
Chloride: 97 mmol/L (ref 96–106)
Creatinine, Ser: 1.04 mg/dL — ABNORMAL HIGH (ref 0.57–1.00)
GFR, EST AFRICAN AMERICAN: 69 mL/min/{1.73_m2} (ref >59)
GFR, EST NON AFRICAN AMERICAN: 60 mL/min/{1.73_m2} (ref >59)
GLOBULIN, TOTAL: 2.1 g/dL (ref 1.5–4.5)
Glucose: 82 mg/dL (ref 65–99)
POTASSIUM: 4.4 mmol/L (ref 3.5–5.2)
SODIUM: 139 mmol/L (ref 134–144)
TOTAL PROTEIN: 6.7 g/dL (ref 6.0–8.5)

## 2017-09-15 LAB — MICROALBUMIN / CREATININE URINE RATIO: CREATININE, UR: 41.7 mg/dL

## 2017-09-21 ENCOUNTER — Ambulatory Visit (INDEPENDENT_AMBULATORY_CARE_PROVIDER_SITE_OTHER): Payer: BLUE CROSS/BLUE SHIELD | Admitting: *Deleted

## 2017-09-21 NOTE — Progress Notes (Signed)
Pt did not have sensor Will come back after picking up sensors

## 2017-09-23 ENCOUNTER — Telehealth: Payer: Self-pay | Admitting: Nurse Practitioner

## 2017-09-23 MED ORDER — FREESTYLE LIBRE 14 DAY SENSOR MISC: 1.0000 | 5 refills | Status: DC

## 2017-09-23 MED ORDER — SULFAMETHOXAZOLE-TRIMETHOPRIM 800-160 MG PO TABS: 1.0000 | ORAL_TABLET | Freq: Two times a day (BID) | ORAL | 0 refills | Status: DC

## 2017-09-23 NOTE — Telephone Encounter (Signed)
ensors called in as well as bactrim for toe

## 2017-09-24 ENCOUNTER — Ambulatory Visit (INDEPENDENT_AMBULATORY_CARE_PROVIDER_SITE_OTHER): Payer: BLUE CROSS/BLUE SHIELD | Admitting: *Deleted

## 2017-09-24 NOTE — Progress Notes (Signed)
Discussed cgm

## 2017-09-29 DIAGNOSIS — Z794 Long term (current) use of insulin: Secondary | ICD-10-CM | POA: Diagnosis not present

## 2017-09-29 DIAGNOSIS — E109 Type 1 diabetes mellitus without complications: Secondary | ICD-10-CM | POA: Diagnosis not present

## 2017-10-02 ENCOUNTER — Ambulatory Visit: Payer: BLUE CROSS/BLUE SHIELD | Admitting: Family Medicine

## 2017-10-02 ENCOUNTER — Encounter: Payer: Self-pay | Admitting: Family Medicine

## 2017-10-02 VITALS — BP 140/72 | HR 76 | Temp 98.4°F | Ht 67.0 in | Wt 165.0 lb

## 2017-10-02 DIAGNOSIS — S90122A Contusion of left lesser toe(s) without damage to nail, initial encounter: Secondary | ICD-10-CM | POA: Diagnosis not present

## 2017-10-02 DIAGNOSIS — R059 Cough, unspecified: Secondary | ICD-10-CM

## 2017-10-02 DIAGNOSIS — S90129A Contusion of unspecified lesser toe(s) without damage to nail, initial encounter: Secondary | ICD-10-CM

## 2017-10-02 DIAGNOSIS — R05 Cough: Secondary | ICD-10-CM

## 2017-10-02 MED ORDER — RANITIDINE HCL 150 MG PO TABS: 150.0000 mg | ORAL_TABLET | Freq: Two times a day (BID) | ORAL | 1 refills | Status: DC

## 2017-10-02 NOTE — Progress Notes (Signed)
BP 140/72   Pulse 76   Temp 98.4 F (36.9 C) (Oral)   Ht 5\' 7"  (1.702 m)   Wt 165 lb (74.8 kg)   BMI 25.84 kg/m    Subjective:    Patient ID: Alejandra Mcdonald, female    DOB: July 20, 1961, 57 y.o.   MRN: [id number]  HPI: Alejandra Mcdonald is a 57 y.o. female presenting on 10/02/2017 for Cough (ongoing since December; has been seen here twice) and Lesion on toe of left foot (called MMM and she sent in antibiotic, patient is still taking)   HPI Cough and irritated throat Patient comes in complaining of cough and irritated throat that is been going on for the past couple weeks.  She has been trying to take Benadryl and Claritin to help with that and does not seem to be making any difference.  She developed a sore on her toe and was sent in Bactrim for that and she has been taking that for the past 10 days and she feels like her cough and irritation in her throat has actually gotten worse.  She denies any fevers or chills or sinus pressure or nasal drainage or ear pressure.  She denies any shortness of breath or wheezing.  Patient is not on an ACE inhibitor.  She denies any symptoms of heartburn or acid reflux or belching but just feels like she has this tickle and irritation in the back of her throat.  Her cough is dry and nonproductive but sometimes gets severe enough that she will had posttussive emesis  Patient also developed a discoloration on the end of her second left toe and says she is a diabetic she called in and was given an antibiotic of Bactrim which she has taken for the past 10 days and has not seen much change but has not seen any signs of infection on the end of her toe.  She does admit that she hit it and that is when she noticed it and she is keeping a close eye on it.  She denies any pain or discoloration anywhere else or any open sores or wounds currently.  She has good sensation in all of her feet currently.  Relevant past medical, surgical, family and social history reviewed and  updated as indicated. Interim medical history since our last visit reviewed. Allergies and medications reviewed and updated.  Review of Systems  Constitutional: Negative for chills and fever.  HENT: Negative for congestion, ear discharge, ear pain, postnasal drip, rhinorrhea, sinus pressure, sinus pain, sneezing, sore throat and trouble swallowing.   Eyes: Negative for redness and visual disturbance.  Respiratory: Positive for cough. Negative for chest tightness and shortness of breath.   Cardiovascular: Negative for chest pain and leg swelling.  Genitourinary: Negative for difficulty urinating and dysuria.  Musculoskeletal: Negative for back pain and gait problem.  Skin: Positive for color change. Negative for rash.  Neurological: Negative for light-headedness and headaches.  Psychiatric/Behavioral: Negative for agitation and behavioral problems.  All other systems reviewed and are negative.   Per HPI unless specifically indicated above   Allergies as of 10/02/2017      Reactions   Ace Inhibitors Cough   Codeine    Vomiting and nausea.   Lipitor [atorvastatin] Other (See Comments)   Myalgias      Medication List        Accurate as of 10/02/17 11:55 AM. Always use your most recent med list.  aspirin 81 MG tablet Take 81 mg by mouth daily.   calcium citrate-vitamin D 200-200 MG-UNIT Tabs Take 1 tablet by mouth 2 (two) times daily.   fluticasone 50 MCG/ACT nasal spray Commonly known as:  FLONASE Place 2 sprays into both nostrils daily.   FREESTYLE LIBRE 14 DAY READER Devi 1 Device by Does not apply route daily.   FREESTYLE LIBRE 14 DAY SENSOR Misc 1 Device by Does not apply route every 14 (fourteen) days.   insulin aspart 100 UNIT/ML injection Commonly known as:  novoLOG Per pump up to 100 units per day   levothyroxine 125 MCG tablet Commonly known as:  SYNTHROID, LEVOTHROID Take 1 tablet (125 mcg total) by mouth daily.   meclizine 25 MG  tablet Commonly known as:  ANTIVERT Take 1 tablet (25 mg total) by mouth 3 (three) times daily as needed for dizziness.   multivitamin,tx-minerals tablet Take 1 tablet by mouth daily.   polyethylene glycol powder powder Commonly known as:  GLYCOLAX/MIRALAX MIX 17GRAMS (1 CAPFUL) WITH 8 OUNCES OF LIQUID AND DRINK BY MOUTH  TID   ranitidine 150 MG tablet Commonly known as:  ZANTAC Take 1 tablet (150 mg total) by mouth 2 (two) times daily.   simvastatin 40 MG tablet Commonly known as:  ZOCOR Take 1 tablet (40 mg total) by mouth every evening.   sulfamethoxazole-trimethoprim 800-160 MG tablet Commonly known as:  BACTRIM DS Take 1 tablet by mouth 2 (two) times daily.          Objective:    BP 140/72   Pulse 76   Temp 98.4 F (36.9 C) (Oral)   Ht 5\' 7"  (1.702 m)   Wt 165 lb (74.8 kg)   BMI 25.84 kg/m   Wt Readings from Last 3 Encounters:  10/02/17 165 lb (74.8 kg)  09/14/17 165 lb (74.8 kg)  08/12/17 165 lb (74.8 kg)    Physical Exam  Constitutional: She is oriented to person, place, and time. She appears well-developed and well-nourished. No distress.  HENT:  Right Ear: External ear normal.  Left Ear: External ear normal.  Nose: Nose normal.  Mouth/Throat: Oropharynx is clear and moist. No oropharyngeal exudate.  Eyes: Conjunctivae are normal. No scleral icterus.  Cardiovascular: Normal rate, regular rhythm, normal heart sounds and intact distal pulses.  No murmur heard. Pulmonary/Chest: Effort normal and breath sounds normal. No respiratory distress. She has no wheezes. She has no rales.  Neurological: She is alert and oriented to person, place, and time. Coordination normal.  Skin: Skin is warm and dry. Lesion (Bruising or ischemic changes on the end of her second left toe, nontender, no signs of infection, she needs to monitor this for any changes here in the near future.) noted. No rash noted. She is not diaphoretic.  Psychiatric: She has a normal mood and  affect. Her behavior is normal.  Nursing note and vitals reviewed.       Assessment & Plan:   Problem List Items Addressed This Visit    None    Visit Diagnoses    Cough    -  Primary   Patient has been taking Zyrtec and Benadryl over the past couple weeks and has seen no improvement, will try Zantac   Relevant Medications   ranitidine (ZANTAC) 150 MG tablet   Bruised toe       Signs of ischemia versus bruising on the end of her left second toe, is on antibiotic, no signs of infection, hydration and monitor for now  Follow up plan: Return if symptoms worsen or fail to improve.  Counseling provided for all of the vaccine components No orders of the defined types were placed in this encounter.   Caryl Pina, MD [facility] 10/02/2017, 11:55 AM

## 2017-10-05 DIAGNOSIS — Z794 Long term (current) use of insulin: Secondary | ICD-10-CM | POA: Diagnosis not present

## 2017-10-05 DIAGNOSIS — E109 Type 1 diabetes mellitus without complications: Secondary | ICD-10-CM | POA: Diagnosis not present

## 2017-10-20 DIAGNOSIS — L0100 Impetigo, unspecified: Secondary | ICD-10-CM | POA: Diagnosis not present

## 2017-10-20 DIAGNOSIS — L570 Actinic keratosis: Secondary | ICD-10-CM | POA: Diagnosis not present

## 2017-10-20 DIAGNOSIS — L280 Lichen simplex chronicus: Secondary | ICD-10-CM | POA: Diagnosis not present

## 2017-10-26 ENCOUNTER — Encounter (INDEPENDENT_AMBULATORY_CARE_PROVIDER_SITE_OTHER): Payer: Self-pay | Admitting: Internal Medicine

## 2017-10-26 ENCOUNTER — Ambulatory Visit (INDEPENDENT_AMBULATORY_CARE_PROVIDER_SITE_OTHER): Payer: BLUE CROSS/BLUE SHIELD | Admitting: Internal Medicine

## 2017-10-26 VITALS — BP 150/70 | HR 60 | Temp 98.5°F | Ht 67.0 in | Wt 167.0 lb

## 2017-10-26 DIAGNOSIS — K5904 Chronic idiopathic constipation: Secondary | ICD-10-CM | POA: Diagnosis not present

## 2017-10-26 DIAGNOSIS — Z1211 Encounter for screening for malignant neoplasm of colon: Secondary | ICD-10-CM | POA: Diagnosis not present

## 2017-10-26 NOTE — Progress Notes (Signed)
Subjective:    Patient ID: Alejandra Mcdonald, female    DOB: Dec 26, 1960, 57 y.o.   MRN: [id number]  HPI Referred by Ronnald Collum NP for chronic constipation.  Her last colonoscopy was 10 yrs ago (for constipation) and she reports it was normal. (Dr. Anthony Sar). She is due for a colonoscopy. She says her constipation is the same.  She is having a BM x 1 a week. Her stools are not hard. Takes Miralax and MOM for her constipation. She has tried Linzess ,Research scientist (life sciences) which did not help.  She has tried glycerin supp.which did not help.  She has tried Mag citrate which did not help.  Her appetite is good. No weight loss.   Hx of diabetes x 37 yrs. Insulin dependent  Review of Systems Past Medical History:  Diagnosis Date  . Arthritis   . Asthma    as child   no problem in 3 years  . Cataract    bilateral  . Diabetes mellitus without complication ([place])   . Hyperlipidemia   . Hypothyroidism   . PONV (postoperative nausea and vomiting)   . Thyroid disease   . Vasomotor phenomenon     Past Surgical History:  Procedure Laterality Date  . APPENDECTOMY    . BILATERAL CARPAL TUNNEL RELEASE    . CATARACT EXTRACTION Bilateral may & june 2017  . CESAREAN SECTION    . TUBAL LIGATION      Allergies  Allergen Reactions  . Ace Inhibitors Cough  . Codeine     Vomiting and nausea.  . Lipitor [Atorvastatin] Other (See Comments)    Myalgias    Current Outpatient Medications on File Prior to Visit  Medication Sig Dispense Refill  . aspirin 81 MG tablet Take 81 mg by mouth daily.      . calcium citrate-vitamin D 200-200 MG-UNIT TABS Take 1 tablet by mouth 2 (two) times daily.     . fluticasone (FLONASE) 50 MCG/ACT nasal spray Place 2 sprays into both nostrils daily. (Patient taking differently: Place 2 sprays into both nostrils as needed. ) 16 g 6  . insulin aspart (NOVOLOG) 100 UNIT/ML injection Per pump up to 100 units per day 3 vial 11  . levothyroxine (SYNTHROID, LEVOTHROID) 125 MCG  tablet Take 1 tablet (125 mcg total) by mouth daily. 90 tablet 2  . Multiple Vitamins-Minerals (MULTIVITAMIN,TX-MINERALS) tablet Take 1 tablet by mouth daily.      . polyethylene glycol powder (GLYCOLAX/MIRALAX) powder MIX 17GRAMS (1 CAPFUL) WITH 8 OUNCES OF LIQUID AND DRINK BY MOUTH  TID 2295 g 2  . simvastatin (ZOCOR) 40 MG tablet Take 1 tablet (40 mg total) by mouth every evening. 30 tablet 3  . Continuous Blood Gluc Receiver (FREESTYLE LIBRE 14 DAY READER) DEVI 1 Device by Does not apply route daily. 2 Device 5  . Continuous Blood Gluc Sensor (FREESTYLE LIBRE 14 DAY SENSOR) MISC 1 Device by Does not apply route every 14 (fourteen) days. 2 each 5  . sulfamethoxazole-trimethoprim (BACTRIM DS) 800-160 MG tablet Take 1 tablet by mouth 2 (two) times daily. (Patient not taking: Reported on 10/26/2017) 20 tablet 0   No current facility-administered medications on file prior to visit.         Objective:   Physical Exam Blood pressure (!) 150/70, pulse 60, temperature 98.5 F (36.9 C), height 5\' 7"  (1.702 m), weight 167 lb (75.8 kg). Alert and oriented. Skin warm and dry. Oral mucosa is moist.   . Sclera anicteric, conjunctivae  is pink. Thyroid not enlarged. No cervical lymphadenopathy. Lungs clear. Heart regular rate and rhythm.  Abdomen is soft. Bowel sounds are positive. No hepatomegaly. No abdominal masses felt. No tenderness.  No edema to lower extremities.         Assessment & Plan:  Screening colonoscopy. The risks of bleeding, perforation and infection were reviewed with patient. Constipation. Increase fiber in diet. Lots of fluid. Hand out on constipation given to patient.

## 2017-10-26 NOTE — Patient Instructions (Addendum)
The risks of bleeding, perforation and infection were reviewed with patient. Increase fiber in your diet.   Constipation, Adult Constipation is when a person:  Poops (has a bowel movement) fewer times in a week than normal.  Has a hard time pooping.  Has poop that is dry, hard, or bigger than normal.  Follow these instructions at home: Eating and drinking   Eat foods that have a lot of fiber, such as: ? Fresh fruits and vegetables. ? Whole grains. ? Beans.  Eat less of foods that are high in fat, low in fiber, or overly processed, such as: ? [place] fries. ? Hamburgers. ? Cookies. ? Candy. ? Soda.  Drink enough fluid to keep your pee (urine) clear or pale yellow. General instructions  Exercise regularly or as told by your doctor.  Go to the restroom when you feel like you need to poop. Do not hold it in.  Take over-the-counter and prescription medicines only as told by your doctor. These include any fiber supplements.  Do pelvic floor retraining exercises, such as: ? Doing deep breathing while relaxing your lower belly (abdomen). ? Relaxing your pelvic floor while pooping.  Watch your condition for any changes.  Keep all follow-up visits as told by your doctor. This is important. Contact a doctor if:  You have pain that gets worse.  You have a fever.  You have not pooped for 4 days.  You throw up (vomit).  You are not hungry.  You lose weight.  You are bleeding from the anus.  You have thin, pencil-like poop (stool). Get help right away if:  You have a fever, and your symptoms suddenly get worse.  You leak poop or have blood in your poop.  Your belly feels hard or bigger than normal (is bloated).  You have very bad belly pain.  You feel dizzy or you faint. This information is not intended to replace advice given to you by your health care provider. Make sure you discuss any questions you have with your health care provider. Document Released:  01/20/2008 Document Revised: 02/21/2016 Document Reviewed: 01/22/2016 Elsevier Interactive Patient Education  2018 [employer].

## 2017-10-27 ENCOUNTER — Telehealth (INDEPENDENT_AMBULATORY_CARE_PROVIDER_SITE_OTHER): Payer: Self-pay | Admitting: *Deleted

## 2017-10-27 NOTE — Telephone Encounter (Signed)
I have talked with patient. I told her colonoscopy would be 2 different procedures. She would like to proceed with Dr. Laural Golden

## 2017-10-27 NOTE — Telephone Encounter (Signed)
Patient called this morning asking if Dr Gala Romney could do her colonoscopy because he does hemorrhoid bandings and she would rather have it done all at one time to prevent taking so much time off work, I made her aware of the policy between the two offices but I would ask for her. I spoke with Reba and to keep patient satisfaction Reba said if Alejandra Mcdonald could speak with one of the providers at [facility] to see if she can transfer there. Patient was seen 10/26/17 first visit with Terri. Please advise

## 2017-10-29 ENCOUNTER — Telehealth (INDEPENDENT_AMBULATORY_CARE_PROVIDER_SITE_OTHER): Payer: Self-pay | Admitting: *Deleted

## 2017-10-29 ENCOUNTER — Encounter (INDEPENDENT_AMBULATORY_CARE_PROVIDER_SITE_OTHER): Payer: Self-pay | Admitting: *Deleted

## 2017-10-29 DIAGNOSIS — Z1211 Encounter for screening for malignant neoplasm of colon: Secondary | ICD-10-CM | POA: Insufficient documentation

## 2017-10-29 NOTE — Telephone Encounter (Signed)
Patient needs trilyte 

## 2017-11-01 DIAGNOSIS — E109 Type 1 diabetes mellitus without complications: Secondary | ICD-10-CM | POA: Diagnosis not present

## 2017-11-01 MED ORDER — PEG 3350-KCL-NA BICARB-NACL 420 G PO SOLR: 4000.0000 mL | Freq: Once | ORAL | 0 refills | Status: AC

## 2017-11-02 DIAGNOSIS — E109 Type 1 diabetes mellitus without complications: Secondary | ICD-10-CM | POA: Diagnosis not present

## 2017-11-23 ENCOUNTER — Telehealth (INDEPENDENT_AMBULATORY_CARE_PROVIDER_SITE_OTHER): Payer: Self-pay | Admitting: *Deleted

## 2017-11-23 NOTE — Telephone Encounter (Signed)
Patient has spoke with Mitzie and told Tammy to cancel procedure--Tammy said something with her Dad.  Probably just want to reach back out and see if she wants to reschedule.

## 2017-11-25 ENCOUNTER — Other Ambulatory Visit: Payer: Self-pay | Admitting: *Deleted

## 2017-11-25 NOTE — Telephone Encounter (Signed)
According to chart patient is on omeprazole- please calll and make sure which med she wants- omeprazole or zantac?

## 2017-11-25 NOTE — Telephone Encounter (Signed)
Fax received [facility] Refill Ranitidine, not on current med list Please advise

## 2017-11-25 NOTE — Telephone Encounter (Signed)
Spoke with patient and she has both  but does not want either refilled because she is not sure if either are helping or not. She is only taking one at a time but wants to wait until her appt to discuss.

## 2017-11-26 ENCOUNTER — Ambulatory Visit (HOSPITAL_COMMUNITY): Admit: 2017-11-26 | Payer: BLUE CROSS/BLUE SHIELD | Admitting: Internal Medicine

## 2017-11-26 ENCOUNTER — Encounter (HOSPITAL_COMMUNITY): Payer: Self-pay

## 2017-11-26 ENCOUNTER — Other Ambulatory Visit: Payer: Self-pay | Admitting: Family Medicine

## 2017-11-26 DIAGNOSIS — R05 Cough: Secondary | ICD-10-CM

## 2017-11-26 DIAGNOSIS — R059 Cough, unspecified: Secondary | ICD-10-CM

## 2017-11-26 SURGERY — COLONOSCOPY
Anesthesia: Moderate Sedation

## 2017-12-14 ENCOUNTER — Ambulatory Visit: Payer: BLUE CROSS/BLUE SHIELD | Admitting: Nurse Practitioner

## 2017-12-23 ENCOUNTER — Ambulatory Visit: Payer: BLUE CROSS/BLUE SHIELD | Admitting: Nurse Practitioner

## 2018-01-03 DIAGNOSIS — E109 Type 1 diabetes mellitus without complications: Secondary | ICD-10-CM | POA: Diagnosis not present

## 2018-01-03 DIAGNOSIS — Z794 Long term (current) use of insulin: Secondary | ICD-10-CM | POA: Diagnosis not present

## 2018-03-18 ENCOUNTER — Other Ambulatory Visit: Payer: Self-pay | Admitting: Nurse Practitioner

## 2018-03-18 DIAGNOSIS — R05 Cough: Secondary | ICD-10-CM

## 2018-03-18 DIAGNOSIS — R059 Cough, unspecified: Secondary | ICD-10-CM

## 2018-03-18 NOTE — Telephone Encounter (Signed)
OV 04/05/18

## 2018-03-31 DIAGNOSIS — E109 Type 1 diabetes mellitus without complications: Secondary | ICD-10-CM | POA: Diagnosis not present

## 2018-04-05 ENCOUNTER — Encounter: Payer: Self-pay | Admitting: Nurse Practitioner

## 2018-04-05 ENCOUNTER — Ambulatory Visit: Payer: BLUE CROSS/BLUE SHIELD | Admitting: Nurse Practitioner

## 2018-04-05 VITALS — BP 124/71 | HR 72 | Temp 97.9°F | Ht 67.0 in | Wt 169.0 lb

## 2018-04-05 DIAGNOSIS — E109 Type 1 diabetes mellitus without complications: Secondary | ICD-10-CM

## 2018-04-05 DIAGNOSIS — K5904 Chronic idiopathic constipation: Secondary | ICD-10-CM | POA: Diagnosis not present

## 2018-04-05 DIAGNOSIS — R05 Cough: Secondary | ICD-10-CM

## 2018-04-05 DIAGNOSIS — Z794 Long term (current) use of insulin: Secondary | ICD-10-CM | POA: Diagnosis not present

## 2018-04-05 DIAGNOSIS — E785 Hyperlipidemia, unspecified: Secondary | ICD-10-CM

## 2018-04-05 DIAGNOSIS — E039 Hypothyroidism, unspecified: Secondary | ICD-10-CM

## 2018-04-05 DIAGNOSIS — R059 Cough, unspecified: Secondary | ICD-10-CM

## 2018-04-05 LAB — BAYER DCA HB A1C WAIVED: HB A1C: 7.2 % — AB (ref <7.0)

## 2018-04-05 MED ORDER — RANITIDINE HCL 150 MG PO TABS: 150.0000 mg | ORAL_TABLET | Freq: Two times a day (BID) | ORAL | 1 refills | Status: DC

## 2018-04-05 MED ORDER — SIMVASTATIN 40 MG PO TABS: 40.0000 mg | ORAL_TABLET | Freq: Every evening | ORAL | 3 refills | Status: DC

## 2018-04-05 MED ORDER — LEVOTHYROXINE SODIUM 125 MCG PO TABS: 125.0000 ug | ORAL_TABLET | Freq: Every day | ORAL | 1 refills | Status: DC

## 2018-04-05 NOTE — Progress Notes (Signed)
Subjective:    Patient ID: Alejandra Mcdonald, female    DOB: April 26, 1961, 57 y.o.   MRN: [id number]   Chief Complaint: Medical management of chronic issues  HPI:  1. Type 1 diabetes mellitus with target hemoglobin A1c of less than 7.5  percent (HCC) last hgab1c was 7.2%. Blood sugar is up and down- she has better control since she gt on libre.  2. Hypothyroidism, unspecified type  No problems that sh eis aware of   3. Hyperlipidemia with target LDL less than 100  Does not watch diet. Does exercise 2-3 x a week  4. Chronic idiopathic constipation  She uses miralax daily and sometimes she still has to take a laxative in order to go to restroom. She had a screening colonoscopy in march which went well. They told her to take maitiza and increase fiber in diet.    Outpatient Encounter Medications as of 04/05/2018  Medication Sig  . aspirin 81 MG tablet Take 81 mg by mouth daily.    . calcium citrate-vitamin D 200-200 MG-UNIT TABS Take 1 tablet by mouth 2 (two) times daily.   . Continuous Blood Gluc Receiver (FREESTYLE LIBRE 14 DAY READER) DEVI 1 Device by Does not apply route daily.  . Continuous Blood Gluc Sensor (FREESTYLE LIBRE 14 DAY SENSOR) MISC 1 Device by Does not apply route every 14 (fourteen) days.  . insulin aspart (NOVOLOG) 100 UNIT/ML injection Per pump up to 100 units per day  . levothyroxine (SYNTHROID, LEVOTHROID) 125 MCG tablet Take 1 tablet (125 mcg total) by mouth daily.  . Multiple Vitamins-Minerals (MULTIVITAMIN,TX-MINERALS) tablet Take 1 tablet by mouth daily.    . polyethylene glycol powder (GLYCOLAX/MIRALAX) powder MIX 17GRAMS (1 CAPFUL) WITH 8 OUNCES OF LIQUID AND DRINK BY MOUTH  TID (Patient taking differently: Take 17 g by mouth daily. MIX 17GRAMS (1 CAPFUL) WITH 8 OUNCES OF LIQUID AND DRINK BY MOUTH  TID)  . ranitidine (ZANTAC) 150 MG tablet TAKE 1 TABLET BY MOUTH TWICE A DAY  . simvastatin (ZOCOR) 40 MG tablet Take 1 tablet (40 mg total) by mouth every evening.        New complaints: None today     Review of Systems  Constitutional: Negative for activity change and appetite change.  HENT: Negative.   Eyes: Negative for pain.  Respiratory: Negative for shortness of breath.   Cardiovascular: Negative for chest pain, palpitations and leg swelling.  Gastrointestinal: Negative for abdominal pain.  Endocrine: Negative for polydipsia.  Genitourinary: Negative.   Skin: Negative for rash.  Neurological: Negative for dizziness, weakness and headaches.  Hematological: Does not bruise/bleed easily.  Psychiatric/Behavioral: Negative.   All other systems reviewed and are negative.      Objective:   Physical Exam  Constitutional: She is oriented to person, place, and time. She appears well-developed and well-nourished. No distress.  HENT:  Head: Normocephalic.  Nose: Nose normal.  Mouth/Throat: Oropharynx is clear and moist.  Eyes: Pupils are equal, round, and reactive to light. EOM are normal.  Neck: Normal range of motion. Neck supple. No JVD present. Carotid bruit is not present.  Cardiovascular: Normal rate, regular rhythm, normal heart sounds and intact distal pulses.  Pulmonary/Chest: Effort normal and breath sounds normal. No respiratory distress. She has no wheezes. She has no rales. She exhibits no tenderness.  Abdominal: Soft. Normal appearance, normal aorta and bowel sounds are normal. She exhibits no distension, no abdominal bruit, no pulsatile midline mass and no mass. There is no splenomegaly or  hepatomegaly. There is no tenderness.  Musculoskeletal: Normal range of motion. She exhibits no edema.  Lymphadenopathy:    She has no cervical adenopathy.  Neurological: She is alert and oriented to person, place, and time. She has normal reflexes.  Skin: Skin is warm and dry.  Psychiatric: She has a normal mood and affect. Her behavior is normal. Judgment and thought content normal.    BP 124/71   Pulse 72   Temp 97.9 F (36.6 C)  (Oral)   Ht 5' 7"  (1.702 m)   Wt 169 lb (76.7 kg)   BMI 26.47 kg/m   hgba1c 7.2%      Assessment & Plan:  [name] comes in today with chief complaint of Medical Management of Chronic Issues   Diagnosis and orders addressed:  1. Type 1 diabetes mellitus with target hemoglobin A1c of less than 7.5 percent (HCC) Continue to wtach carbs in diet and check blood sugars frequently - Bayer DCA Hb A1c Waived  2. Hypothyroidism, unspecified type - levothyroxine (SYNTHROID, LEVOTHROID) 125 MCG tablet; Take 1 tablet (125 mcg total) by mouth daily.  Dispense: 90 tablet; Refill: 1  3. Hyperlipidemia with target LDL less than 100 Low fat diet - Lipid panel - CMP14+EGFR - simvastatin (ZOCOR) 40 MG tablet; Take 1 tablet (40 mg total) by mouth every evening.  Dispense: 30 tablet; Refill: 3  4. Chronic idiopathic constipation Keep follow up with gi  5. Cough - ranitidine (ZANTAC) 150 MG tablet; Take 1 tablet (150 mg total) by mouth 2 (two) times daily.  Dispense: 180 tablet; Refill: 1   Labs pending Health Maintenance reviewed Diet and exercise encouraged  Follow up plan: 3 months   Mary-Margaret Hassell Done, FNP

## 2018-04-06 LAB — LIPID PANEL
CHOL/HDL RATIO: 2.4 ratio (ref 0.0–4.4)
Cholesterol, Total: 193 mg/dL (ref 100–199)
HDL: 81 mg/dL (ref >39)
LDL CALC: 92 mg/dL (ref 0–99)
Triglycerides: 101 mg/dL (ref 0–149)
VLDL Cholesterol Cal: 20 mg/dL (ref 5–40)

## 2018-04-06 LAB — CMP14+EGFR
A/G RATIO: 2.4 — AB (ref 1.2–2.2)
ALT: 20 IU/L (ref 0–32)
AST: 22 IU/L (ref 0–40)
Albumin: 4.1 g/dL (ref 3.5–5.5)
Alkaline Phosphatase: 61 IU/L (ref 39–117)
BUN/Creatinine Ratio: 13 (ref 9–23)
BUN: 10 mg/dL (ref 6–24)
Bilirubin Total: 0.3 mg/dL (ref 0.0–1.2)
CALCIUM: 9.5 mg/dL (ref 8.7–10.2)
CO2: 26 mmol/L (ref 20–29)
CREATININE: 0.8 mg/dL (ref 0.57–1.00)
Chloride: 104 mmol/L (ref 96–106)
GFR calc Af Amer: 94 mL/min/{1.73_m2} (ref >59)
GFR, EST NON AFRICAN AMERICAN: 82 mL/min/{1.73_m2} (ref >59)
Globulin, Total: 1.7 g/dL (ref 1.5–4.5)
Glucose: 118 mg/dL — ABNORMAL HIGH (ref 65–99)
Potassium: 4.3 mmol/L (ref 3.5–5.2)
Sodium: 143 mmol/L (ref 134–144)
Total Protein: 5.8 g/dL — ABNORMAL LOW (ref 6.0–8.5)

## 2018-06-02 DIAGNOSIS — Z23 Encounter for immunization: Secondary | ICD-10-CM | POA: Diagnosis not present

## 2018-06-23 DIAGNOSIS — E11319 Type 2 diabetes mellitus with unspecified diabetic retinopathy without macular edema: Secondary | ICD-10-CM | POA: Diagnosis not present

## 2018-06-23 LAB — HM DIABETES EYE EXAM

## 2018-07-01 DIAGNOSIS — Z794 Long term (current) use of insulin: Secondary | ICD-10-CM | POA: Diagnosis not present

## 2018-07-01 DIAGNOSIS — E109 Type 1 diabetes mellitus without complications: Secondary | ICD-10-CM | POA: Diagnosis not present

## 2018-07-05 ENCOUNTER — Ambulatory Visit: Payer: BLUE CROSS/BLUE SHIELD | Admitting: Nurse Practitioner

## 2018-07-07 ENCOUNTER — Ambulatory Visit: Payer: BLUE CROSS/BLUE SHIELD | Admitting: Nurse Practitioner

## 2018-07-08 ENCOUNTER — Encounter: Payer: Self-pay | Admitting: Nurse Practitioner

## 2018-07-08 ENCOUNTER — Ambulatory Visit: Payer: BLUE CROSS/BLUE SHIELD | Admitting: Nurse Practitioner

## 2018-07-08 VITALS — BP 109/60 | HR 67 | Temp 97.5°F | Ht 67.0 in | Wt 151.0 lb

## 2018-07-08 DIAGNOSIS — K5904 Chronic idiopathic constipation: Secondary | ICD-10-CM

## 2018-07-08 DIAGNOSIS — E785 Hyperlipidemia, unspecified: Secondary | ICD-10-CM

## 2018-07-08 DIAGNOSIS — M549 Dorsalgia, unspecified: Secondary | ICD-10-CM

## 2018-07-08 DIAGNOSIS — E039 Hypothyroidism, unspecified: Secondary | ICD-10-CM | POA: Diagnosis not present

## 2018-07-08 DIAGNOSIS — E109 Type 1 diabetes mellitus without complications: Secondary | ICD-10-CM

## 2018-07-08 LAB — BAYER DCA HB A1C WAIVED: HB A1C (BAYER DCA - WAIVED): 7.3 % — ABNORMAL HIGH (ref <7.0)

## 2018-07-08 MED ORDER — SIMVASTATIN 40 MG PO TABS: 40.0000 mg | ORAL_TABLET | Freq: Every evening | ORAL | 1 refills | Status: DC

## 2018-07-08 MED ORDER — LEVOTHYROXINE SODIUM 125 MCG PO TABS: 125.0000 ug | ORAL_TABLET | Freq: Every day | ORAL | 1 refills | Status: DC

## 2018-07-08 MED ORDER — NAPROXEN 500 MG PO TABS: 500.0000 mg | ORAL_TABLET | Freq: Two times a day (BID) | ORAL | 1 refills | Status: DC

## 2018-07-08 MED ORDER — CYCLOBENZAPRINE HCL 10 MG PO TABS: 10.0000 mg | ORAL_TABLET | Freq: Three times a day (TID) | ORAL | 1 refills | Status: DC | PRN

## 2018-07-08 NOTE — Progress Notes (Signed)
Subjective:    Patient ID: Alejandra Mcdonald, female    DOB: [birth year], 57 y.o.   MRN: [id number]   Chief Complaint: Medical Management of Chronic Issues and Back Pain   HPI:  1. Type 1 diabetes mellitus with target hemoglobin A1c of less than 7.5 percent (HCC) Last hgba1c was 7.2. She has an insulin pump and says that her blood sugars have been really good. Denies hypoglycemia  2. Hyperlipidemia with target LDL less than 100  Does watch diet. Exercises 2-3 x a week.   3. Hypothyroidism, unspecified type  No problems that she is aware of.  4. Chronic idiopathic constipation  Unchanged- she has tried everything and has seen GI    Outpatient Encounter Medications as of 07/08/2018  Medication Sig  . aspirin 81 MG tablet Take 81 mg by mouth daily.    . calcium citrate-vitamin D 200-200 MG-UNIT TABS Take 1 tablet by mouth 2 (two) times daily.   . Continuous Blood Gluc Receiver (FREESTYLE LIBRE 14 DAY READER) DEVI 1 Device by Does not apply route daily.  . Continuous Blood Gluc Sensor (FREESTYLE LIBRE 14 DAY SENSOR) MISC 1 Device by Does not apply route every 14 (fourteen) days.  . insulin aspart (NOVOLOG) 100 UNIT/ML injection Per pump up to 100 units per day  . levothyroxine (SYNTHROID, LEVOTHROID) 125 MCG tablet Take 1 tablet (125 mcg total) by mouth daily.  . Multiple Vitamins-Minerals (MULTIVITAMIN,TX-MINERALS) tablet Take 1 tablet by mouth daily.    . polyethylene glycol powder (GLYCOLAX/MIRALAX) powder MIX 17GRAMS (1 CAPFUL) WITH 8 OUNCES OF LIQUID AND DRINK BY MOUTH  TID (Patient taking differently: Take 17 g by mouth daily. MIX 17GRAMS (1 CAPFUL) WITH 8 OUNCES OF LIQUID AND DRINK BY MOUTH  TID)  . ranitidine (ZANTAC) 150 MG tablet Take 1 tablet (150 mg total) by mouth 2 (two) times daily.  . simvastatin (ZOCOR) 40 MG tablet Take 1 tablet (40 mg total) by mouth every evening.       New complaints: Has mid back pain for several days. She says she lifted something at work and  pulled something. Pain seems to be worse at night when she is trying to sleep. She has tried tylenol and ibuprofen and has not helped . Rates pain 8/10 currently.  Social history: Has new granddaughter- 36 months old   Review of Systems  Constitutional: Negative for activity change and appetite change.  HENT: Negative.   Eyes: Negative for pain.  Respiratory: Negative for shortness of breath.   Cardiovascular: Negative for chest pain, palpitations and leg swelling.  Gastrointestinal: Negative for abdominal pain.  Endocrine: Negative for polydipsia.  Genitourinary: Negative.   Skin: Negative for rash.  Neurological: Negative for dizziness, weakness and headaches.  Hematological: Does not bruise/bleed easily.  Psychiatric/Behavioral: Negative.   All other systems reviewed and are negative.      Objective:   Physical Exam  Constitutional: She is oriented to person, place, and time. She appears well-developed and well-nourished. No distress.  HENT:  Head: Normocephalic.  Nose: Nose normal.  Mouth/Throat: Oropharynx is clear and moist.  Eyes: Pupils are equal, round, and reactive to light. EOM are normal.  Neck: Normal range of motion. Neck supple. No JVD present. Carotid bruit is not present.  Cardiovascular: Normal rate, regular rhythm, normal heart sounds and intact distal pulses.  Pulmonary/Chest: Effort normal and breath sounds normal. No respiratory distress. She has no wheezes. She has no rales. She exhibits no tenderness.  Abdominal: Soft. Normal appearance,  normal aorta and bowel sounds are normal. She exhibits no distension, no abdominal bruit, no pulsatile midline mass and no mass. There is no splenomegaly or hepatomegaly. There is no tenderness.  Musculoskeletal: Normal range of motion. She exhibits no edema.  FROM with pain on roatation to right (-) slr bil  Lymphadenopathy:    She has no cervical adenopathy.  Neurological: She is alert and oriented to person, place,  and time. She has normal reflexes. She displays normal reflexes.  Skin: Skin is warm and dry.  Psychiatric: She has a normal mood and affect. Her behavior is normal. Judgment and thought content normal.  Nursing note and vitals reviewed.  BP 109/60   Pulse 67   Temp (!) 97.5 F (36.4 C) (Oral)   Ht _0  (1.702 m)   Wt 151 lb (68.5 kg)   BMI 23.65 kg/m   hgba1c 7.3%    Assessment & Plan:  [name] comes in today with chief complaint of Medical Management of Chronic Issues and Back Pain   Diagnosis and orders addressed:  1. Type 1 diabetes mellitus with target hemoglobin A1c of less than 7.5 percent (HCC) Stricter carb counting - Bayer DCA Hb A1c Waived - CMP14+EGFR  2. Hyperlipidemia with target LDL less than 100 Low fat diet - Lipid panel - simvastatin (ZOCOR) 40 MG tablet; Take 1 tablet (40 mg total) by mouth every evening.  Dispense: 90 tablet; Refill: 1  3. Hypothyroidism, unspecified type - levothyroxine (SYNTHROID, LEVOTHROID) 125 MCG tablet; Take 1 tablet (125 mcg total) by mouth daily.  Dispense: 90 tablet; Refill: 1  4. Chronic idiopathic constipation Increase fiber in diet Force fluids Will reschedule colonoscopy  5. Mid back pain on right side Moist heat rest - cyclobenzaprine (FLEXERIL) 10 MG tablet; Take 1 tablet (10 mg total) by mouth 3 (three) times daily as needed for muscle spasms.  Dispense: 30 tablet; Refill: 1 - naproxen (NAPROSYN) 500 MG tablet; Take 1 tablet (500 mg total) by mouth 2 (two) times daily with a meal.  Dispense: 60 tablet; Refill: 1   Labs pending Health Maintenance reviewed Diet and exercise encouraged  Follow up plan: 3 months   Mary-Margaret Hassell Done, FNP

## 2018-07-08 NOTE — Patient Instructions (Signed)

## 2018-07-09 LAB — CMP14+EGFR
A/G RATIO: 2.3 — AB (ref 1.2–2.2)
ALBUMIN: 4.4 g/dL (ref 3.5–5.5)
ALK PHOS: 60 IU/L (ref 39–117)
ALT: 28 IU/L (ref 0–32)
AST: 52 IU/L — AB (ref 0–40)
BUN / CREAT RATIO: 18 (ref 9–23)
BUN: 15 mg/dL (ref 6–24)
Bilirubin Total: 0.3 mg/dL (ref 0.0–1.2)
CO2: 26 mmol/L (ref 20–29)
Calcium: 9.9 mg/dL (ref 8.7–10.2)
Chloride: 95 mmol/L — ABNORMAL LOW (ref 96–106)
Creatinine, Ser: 0.84 mg/dL (ref 0.57–1.00)
GFR calc Af Amer: 89 mL/min/{1.73_m2} (ref >59)
GFR, EST NON AFRICAN AMERICAN: 77 mL/min/{1.73_m2} (ref >59)
Globulin, Total: 1.9 g/dL (ref 1.5–4.5)
Glucose: 167 mg/dL — ABNORMAL HIGH (ref 65–99)
POTASSIUM: 4.3 mmol/L (ref 3.5–5.2)
SODIUM: 136 mmol/L (ref 134–144)
Total Protein: 6.3 g/dL (ref 6.0–8.5)

## 2018-07-09 LAB — LIPID PANEL
CHOL/HDL RATIO: 2.6 ratio (ref 0.0–4.4)
CHOLESTEROL TOTAL: 187 mg/dL (ref 100–199)
HDL: 72 mg/dL (ref >39)
LDL Calculated: 104 mg/dL — ABNORMAL HIGH (ref 0–99)
Triglycerides: 57 mg/dL (ref 0–149)
VLDL Cholesterol Cal: 11 mg/dL (ref 5–40)

## 2018-08-02 ENCOUNTER — Ambulatory Visit: Payer: BLUE CROSS/BLUE SHIELD | Admitting: Family Medicine

## 2018-08-02 ENCOUNTER — Telehealth: Payer: Self-pay | Admitting: Family Medicine

## 2018-08-02 ENCOUNTER — Encounter: Payer: Self-pay | Admitting: Family Medicine

## 2018-08-02 VITALS — BP 128/70 | HR 73 | Temp 98.3°F | Wt 149.0 lb

## 2018-08-02 DIAGNOSIS — B9789 Other viral agents as the cause of diseases classified elsewhere: Secondary | ICD-10-CM

## 2018-08-02 DIAGNOSIS — J069 Acute upper respiratory infection, unspecified: Secondary | ICD-10-CM

## 2018-08-02 DIAGNOSIS — J029 Acute pharyngitis, unspecified: Secondary | ICD-10-CM | POA: Diagnosis not present

## 2018-08-02 LAB — CULTURE, GROUP A STREP

## 2018-08-02 LAB — RAPID STREP SCREEN (MED CTR MEBANE ONLY): Strep Gp A Ag, IA W/Reflex: NEGATIVE

## 2018-08-02 MED ORDER — CEFDINIR 300 MG PO CAPS: 300.0000 mg | ORAL_CAPSULE | Freq: Two times a day (BID) | ORAL | 0 refills | Status: DC

## 2018-08-02 MED ORDER — MAGIC MOUTHWASH W/LIDOCAINE: ORAL | 0 refills | Status: DC

## 2018-08-02 NOTE — Telephone Encounter (Signed)
[facility]

## 2018-08-02 NOTE — Progress Notes (Signed)
Subjective: CC: URI PCP: Alejandra Pretty, FNP PFX:[id number] [name] is a 57 y.o. female presenting to clinic today for:  1. URI Patient reports onset of sore throat bilateral ear pain Saturday evening.  She notes she had a low-grade fever.  She has associated malaise and now laryngitis.  She reports a mild cough, headache and nausea but no vomiting or diarrhea.  No known sick contacts recently.  She has been using Tylenol and Sinutabs with no improvement in symptoms.  Her blood sugars have been running a little bit higher since she has been sick as well.  ROS: Per HPI  Allergies  Allergen Reactions  . Ace Inhibitors Cough  . Codeine     Vomiting and nausea.  . Lipitor [Atorvastatin] Other (See Comments)    Myalgias   Past Medical History:  Diagnosis Date  . Arthritis   . Asthma    as child   no problem in 3 years  . Cataract    bilateral  . Diabetes mellitus without complication ([place])   . Hyperlipidemia   . Hypothyroidism   . PONV (postoperative nausea and vomiting)   . Thyroid disease   . Vasomotor phenomenon     Current Outpatient Medications:  .  aspirin 81 MG tablet, Take 81 mg by mouth daily.  , Disp: , Rfl:  .  calcium citrate-vitamin D 200-200 MG-UNIT TABS, Take 1 tablet by mouth 2 (two) times daily. , Disp: , Rfl:  .  Continuous Blood Gluc Receiver (FREESTYLE LIBRE 14 DAY READER) DEVI, 1 Device by Does not apply route daily., Disp: 2 Device, Rfl: 5 .  Continuous Blood Gluc Sensor (FREESTYLE LIBRE 14 DAY SENSOR) MISC, 1 Device by Does not apply route every 14 (fourteen) days., Disp: 2 each, Rfl: 5 .  cyclobenzaprine (FLEXERIL) 10 MG tablet, Take 1 tablet (10 mg total) by mouth 3 (three) times daily as needed for muscle spasms., Disp: 30 tablet, Rfl: 1 .  insulin aspart (NOVOLOG) 100 UNIT/ML injection, Per pump up to 100 units per day, Disp: 3 vial, Rfl: 11 .  levothyroxine (SYNTHROID, LEVOTHROID) 125 MCG tablet, Take 1 tablet (125 mcg total) by mouth daily., Disp:  90 tablet, Rfl: 1 .  Multiple Vitamins-Minerals (MULTIVITAMIN,TX-MINERALS) tablet, Take 1 tablet by mouth daily.  , Disp: , Rfl:  .  naproxen (NAPROSYN) 500 MG tablet, Take 1 tablet (500 mg total) by mouth 2 (two) times daily with a meal., Disp: 60 tablet, Rfl: 1 .  polyethylene glycol powder (GLYCOLAX/MIRALAX) powder, MIX 17GRAMS (1 CAPFUL) WITH 8 OUNCES OF LIQUID AND DRINK BY MOUTH  TID (Patient taking differently: Take 17 g by mouth daily. MIX 17GRAMS (1 CAPFUL) WITH 8 OUNCES OF LIQUID AND DRINK BY MOUTH  TID), Disp: 2295 g, Rfl: 2 .  ranitidine (ZANTAC) 150 MG tablet, Take 1 tablet (150 mg total) by mouth 2 (two) times daily., Disp: 180 tablet, Rfl: 1 .  simvastatin (ZOCOR) 40 MG tablet, Take 1 tablet (40 mg total) by mouth every evening., Disp: 90 tablet, Rfl: 1 Social History   Socioeconomic History  . Marital status: Married    Spouse name: Not on file  . Number of children: Not on file  . Years of education: Not on file  . Highest education level: Not on file  Occupational History  . Not on file  Social Needs  . Financial resource strain: Not on file  . Food insecurity:    Worry: Not on file    Inability: Not on file  .  Transportation needs:    Medical: Not on file    Non-medical: Not on file  Tobacco Use  . Smoking status: Never Smoker  . Smokeless tobacco: Never Used  Substance and Sexual Activity  . Alcohol use: No  . Drug use: No  . Sexual activity: Not on file  Lifestyle  . Physical activity:    Days per week: Not on file    Minutes per session: Not on file  . Stress: Not on file  Relationships  . Social connections:    Talks on phone: Not on file    Gets together: Not on file    Attends religious service: Not on file    Active member of club or organization: Not on file    Attends meetings of clubs or organizations: Not on file    Relationship status: Not on file  . Intimate partner violence:    Fear of current or ex partner: Not on file    Emotionally  abused: Not on file    Physically abused: Not on file    Forced sexual activity: Not on file  Other Topics Concern  . Not on file  Social History Narrative  . Not on file   Family History  Problem Relation Age of Onset  . Hypertension Mother   . Diabetes Mother   . Hypertension Father   . Healthy Brother   . Healthy Brother   . Healthy Brother     Objective: Office vital signs reviewed. BP 128/70   Pulse 73   Temp 98.3 F (36.8 C)   Wt 149 lb (67.6 kg)   BMI 23.34 kg/m   Physical Examination:  General: Awake, alert, well nourished, No acute distress HEENT: Normal    Neck: No masses palpated. No lymphadenopathy    Ears: Tympanic membranes intact, normal light reflex, no erythema, no bulging    Eyes: PERRLA, extraocular membranes intact, sclera white    Nose: nasal turbinates moist, clear nasal discharge    Throat: moist mucus membranes, no erythema, no tonsillar exudate.  Airway is patent Cardio: regular rate and rhythm, S1S2 heard, no murmurs appreciated Pulm: clear to auscultation bilaterally, no wheezes, rhonchi or rales; normal work of breathing on room air  Assessment/ Plan: 57 y.o. female   1. Viral URI with cough Patient is afebrile nontoxic-appearing.  Rapid strep was negative.  This appears to be a viral URI.  Supportive care recommended.  Okay to proceed with oral NSAID and Tylenol for pain and fevers.  I have prescribed her Magic mouthwash with lidocaine and instructions for use reviewed with the patient.  I have given her a written prescription for Omnicef to use if needed.  We discussed indications for use.  Reasons for return discussed.  Handout provided.  Follow-up PRN.  2. Sore throat - Rapid Strep Screen ([facility] ONLY)   Orders Placed This Encounter  Procedures  . Rapid Strep Screen ([facility] ONLY)   Meds ordered this encounter  Medications  . cefdinir (OMNICEF) 300 MG capsule    Sig: Take 1 capsule (300 mg total) by mouth 2 (two)  times daily. 1 po BID    Dispense:  20 capsule    Refill:  0  . magic mouthwash w/lidocaine SOLN    Sig: Gargle and spit 23mL every 6 hours as needed for sore throat    Dispense:  240 mL    Refill:  0    60 mL lidocaine: 480 mL of magic mouthwash  Alejandra Norlander, DO [facility] [phone]

## 2018-08-02 NOTE — Patient Instructions (Addendum)
Your strep test was negative.  This appears to be a viral upper respiratory infection. I have prescribed you Magic mouthwash containing lidocaine which will help soothe your sore throat.  In between uses, I recommend gargling with salt water.  You may use Aleve if needed for pain and inflammation.  You may also continue Tylenol.  Make sure you are getting plenty of fluids and rest.  If symptoms worsen or are not resolving, start the antibiotic I prescribed you.  It appears that you have a viral upper respiratory infection (cold).  Cold symptoms can last up to 2 weeks.    - Get plenty of rest and drink plenty of fluids. - Try to breathe moist air. Use a cold mist humidifier. - Consume warm fluids (soup or tea) to provide relief for a stuffy nose and to loosen phlegm. - For nasal stuffiness, try saline nasal spray or a Neti Pot. Afrin nasal spray can also be used but this product should not be used longer than 3 days or it will cause rebound nasal stuffiness (worsening nasal congestion). - For sore throat pain relief: use chloraseptic spray, suck on throat lozenges, hard candy or popsicles; gargle with warm salt water (1/4 tsp. salt per 8 oz. of water); and eat soft, bland foods. - Eat a well-balanced diet. If you cannot, ensure you are getting enough nutrients by taking a daily multivitamin. - Avoid dairy products, as they can thicken phlegm. - Avoid alcohol, as it impairs your body's immune system.  CONTACT YOUR DOCTOR IF YOU EXPERIENCE ANY OF THE FOLLOWING: - High fever - Ear pain - Sinus-type headache - Unusually severe cold symptoms - Cough that gets worse while other cold symptoms improve - Flare up of any chronic lung problem, such as asthma - Your symptoms persist longer than 2 weeks

## 2018-08-03 ENCOUNTER — Telehealth: Payer: Self-pay | Admitting: Nurse Practitioner

## 2018-08-03 MED ORDER — MAGIC MOUTHWASH W/LIDOCAINE: ORAL | 0 refills | Status: DC

## 2018-08-03 NOTE — Telephone Encounter (Signed)
Called [facility] and gave verbal order for Magic Mouthwash.

## 2018-08-06 ENCOUNTER — Encounter: Payer: Self-pay | Admitting: Family Medicine

## 2018-08-06 ENCOUNTER — Ambulatory Visit: Payer: BLUE CROSS/BLUE SHIELD | Admitting: Family Medicine

## 2018-08-06 VITALS — BP 131/68 | HR 73 | Temp 97.8°F | Ht 67.0 in | Wt 148.0 lb

## 2018-08-06 DIAGNOSIS — J209 Acute bronchitis, unspecified: Secondary | ICD-10-CM | POA: Diagnosis not present

## 2018-08-06 MED ORDER — METHYLPREDNISOLONE ACETATE 80 MG/ML IJ SUSP
80.0000 mg | Freq: Once | INTRAMUSCULAR | Status: AC
  Administered 2018-08-06: 80 mg via INTRAMUSCULAR

## 2018-08-06 MED ORDER — BENZONATATE 100 MG PO CAPS: 100.0000 mg | ORAL_CAPSULE | Freq: Two times a day (BID) | ORAL | 0 refills | Status: DC | PRN

## 2018-08-06 NOTE — Progress Notes (Signed)
BP 131/68   Pulse 73   Temp 97.8 F (36.6 C) (Oral)   Ht 5\' 7"  (1.702 m)   Wt 148 lb (67.1 kg)   BMI 23.18 kg/m    Subjective:    Patient ID: Alejandra Mcdonald, female    DOB: 04/05/1961, 57 y.o.   MRN: [id number]  HPI: Alejandra Mcdonald is a 57 y.o. female presenting on 08/06/2018 for Ringing in right ear, cough (would like to have prescription for Tessalon Perles and prednisone)   HPI Cough and congestion and right ear pressure and ringing in the right ear Patient comes in complaining of cough and congestion and right ear pressure and ringing in the right ear that has been going on for almost a week.  She was seen earlier in this week and was given [facility] which she is about 4 days into and is still taking but she feels like the cough is worsening and she feels like she is having wheezing along with congestion and right ear pain.  Patient is already been using Flonase and Omnicef and Tessalon Perles and saline sprays and salt water gargles  Relevant past medical, surgical, family and social history reviewed and updated as indicated. Interim medical history since our last visit reviewed. Allergies and medications reviewed and updated.  Review of Systems  Constitutional: Negative for chills and fever.  HENT: Positive for congestion, postnasal drip, rhinorrhea, sinus pressure, sneezing and sore throat. Negative for ear discharge and ear pain.   Eyes: Negative for pain, redness and visual disturbance.  Respiratory: Positive for cough. Negative for chest tightness and shortness of breath.   Cardiovascular: Negative for chest pain and leg swelling.  Genitourinary: Negative for difficulty urinating and dysuria.  Musculoskeletal: Negative for back pain and gait problem.  Skin: Negative for rash.  Neurological: Negative for light-headedness and headaches.  Psychiatric/Behavioral: Negative for agitation and behavioral problems.  All other systems reviewed and are negative.   Per HPI unless  specifically indicated above   Allergies as of 08/06/2018      Reactions   Ace Inhibitors Cough   Codeine    Vomiting and nausea.   Lipitor [atorvastatin] Other (See Comments)   Myalgias      Medication List       Accurate as of August 06, 2018 10:58 AM. Always use your most recent med list.        aspirin 81 MG tablet Take 81 mg by mouth daily.   benzonatate 100 MG capsule Commonly known as:  TESSALON Take 1 capsule (100 mg total) by mouth 2 (two) times daily as needed for cough.   calcium citrate-vitamin D 200-200 MG-UNIT Tabs Take 1 tablet by mouth 2 (two) times daily.   cefdinir 300 MG capsule Commonly known as:  OMNICEF Take 1 capsule (300 mg total) by mouth 2 (two) times daily. 1 po BID   cyclobenzaprine 10 MG tablet Commonly known as:  FLEXERIL Take 1 tablet (10 mg total) by mouth 3 (three) times daily as needed for muscle spasms.   FREESTYLE LIBRE 14 DAY READER Devi 1 Device by Does not apply route daily.   FREESTYLE LIBRE 14 DAY SENSOR Misc 1 Device by Does not apply route every 14 (fourteen) days.   insulin aspart 100 UNIT/ML injection Commonly known as:  novoLOG Per pump up to 100 units per day   levothyroxine 125 MCG tablet Commonly known as:  SYNTHROID, LEVOTHROID Take 1 tablet (125 mcg total) by mouth daily.   magic  mouthwash w/lidocaine Soln Gargle and spit 89mL every 6 hours as needed for sore throat   multivitamin,tx-minerals tablet Take 1 tablet by mouth daily.   naproxen 500 MG tablet Commonly known as:  NAPROSYN Take 1 tablet (500 mg total) by mouth 2 (two) times daily with a meal.   polyethylene glycol powder powder Commonly known as:  GLYCOLAX/MIRALAX MIX 17GRAMS (1 CAPFUL) WITH 8 OUNCES OF LIQUID AND DRINK BY MOUTH  TID   ranitidine 150 MG tablet Commonly known as:  ZANTAC Take 1 tablet (150 mg total) by mouth 2 (two) times daily.   simvastatin 40 MG tablet Commonly known as:  ZOCOR Take 1 tablet (40 mg total) by mouth  every evening.          Objective:    BP 131/68   Pulse 73   Temp 97.8 F (36.6 C) (Oral)   Ht 5\' 7"  (1.702 m)   Wt 148 lb (67.1 kg)   BMI 23.18 kg/m   Wt Readings from Last 3 Encounters:  08/06/18 148 lb (67.1 kg)  08/02/18 149 lb (67.6 kg)  07/08/18 151 lb (68.5 kg)    Physical Exam Vitals signs reviewed.  Constitutional:      General: She is not in acute distress.    Appearance: She is well-developed. She is not diaphoretic.  HENT:     Right Ear: Ear canal and external ear normal. A middle ear effusion is present. Tympanic membrane is injected, erythematous and bulging. Tympanic membrane is not perforated.     Left Ear: Tympanic membrane, ear canal and external ear normal.     Nose: Mucosal edema and rhinorrhea present.     Right Sinus: No maxillary sinus tenderness or frontal sinus tenderness.     Left Sinus: No maxillary sinus tenderness or frontal sinus tenderness.     Mouth/Throat:     Pharynx: Uvula midline. Posterior oropharyngeal erythema present. No oropharyngeal exudate.     Tonsils: No tonsillar abscesses.  Eyes:     Conjunctiva/sclera: Conjunctivae normal.  Cardiovascular:     Rate and Rhythm: Normal rate and regular rhythm.     Heart sounds: Normal heart sounds. No murmur.  Pulmonary:     Effort: Pulmonary effort is normal. No respiratory distress.     Breath sounds: Normal breath sounds. No wheezing.  Musculoskeletal: Normal range of motion.        General: No tenderness.  Skin:    General: Skin is warm and dry.     Findings: No rash.  Neurological:     Mental Status: She is alert and oriented to person, place, and time.     Coordination: Coordination normal.  Psychiatric:        Behavior: Behavior normal.         Assessment & Plan:   Problem List Items Addressed This Visit    None    Visit Diagnoses    Acute bronchitis, unspecified organism    -  Primary   Relevant Medications   methylPREDNISolone acetate (DEPO-MEDROL) injection 80 mg    benzonatate (TESSALON) 100 MG capsule      Patient has already been doing a lot of things conservatively and taking an antibiotic, she is still having a lot of congestion and pressure including in her ear, will do Depo-Medrol and Tessalon Perles, she will watch her sugars closely and she has a pump. Follow up plan: Return if symptoms worsen or fail to improve.  Counseling provided for all of the vaccine components No  orders of the defined types were placed in this encounter.   Caryl Pina, MD [facility] 08/06/2018, 10:58 AM

## 2018-08-15 ENCOUNTER — Telehealth: Payer: Self-pay | Admitting: Nurse Practitioner

## 2018-08-15 NOTE — Telephone Encounter (Signed)
Left message to please call our office. 

## 2018-08-15 NOTE — Telephone Encounter (Signed)
What symptoms do you have? Sore throat, ear is still bothering her, bad cough, can not sleep at night  How long have you been sick? 3 weeks  Have you been seen for this problem? Yes twice  If your provider decides to give you a prescription, which pharmacy would you like for it to be sent to? [facility]   Patient informed that this information will be sent to the clinical staff for review and that they should receive a follow up call.

## 2018-08-15 NOTE — Telephone Encounter (Signed)
She was just treated with 10 days of a strong antibiotic. More antibiotics are not likely to help. If any shortness of breath or trouble breathing needs to be seen. She should be using flonase nasal steroid spray, sinus rinses with saline such as neti pot, taking daily anti-histamine. Any worsening should be seen.

## 2018-08-15 NOTE — Telephone Encounter (Signed)
Please advise on request for medication from patient.

## 2018-08-18 NOTE — Telephone Encounter (Signed)
Attempt to contact pt without return call in over 3 days will close encounter.

## 2018-08-24 ENCOUNTER — Other Ambulatory Visit (INDEPENDENT_AMBULATORY_CARE_PROVIDER_SITE_OTHER): Payer: Self-pay | Admitting: *Deleted

## 2018-08-24 DIAGNOSIS — Z1211 Encounter for screening for malignant neoplasm of colon: Secondary | ICD-10-CM

## 2018-10-03 DIAGNOSIS — E109 Type 1 diabetes mellitus without complications: Secondary | ICD-10-CM | POA: Diagnosis not present

## 2018-10-03 DIAGNOSIS — Z794 Long term (current) use of insulin: Secondary | ICD-10-CM | POA: Diagnosis not present

## 2018-10-11 ENCOUNTER — Encounter: Payer: Self-pay | Admitting: Nurse Practitioner

## 2018-10-11 ENCOUNTER — Ambulatory Visit: Payer: BLUE CROSS/BLUE SHIELD | Admitting: Nurse Practitioner

## 2018-10-11 VITALS — BP 122/73 | HR 67 | Temp 97.3°F | Ht 67.0 in | Wt 145.0 lb

## 2018-10-11 DIAGNOSIS — R059 Cough, unspecified: Secondary | ICD-10-CM

## 2018-10-11 DIAGNOSIS — Z1231 Encounter for screening mammogram for malignant neoplasm of breast: Secondary | ICD-10-CM | POA: Diagnosis not present

## 2018-10-11 DIAGNOSIS — E039 Hypothyroidism, unspecified: Secondary | ICD-10-CM

## 2018-10-11 DIAGNOSIS — E785 Hyperlipidemia, unspecified: Secondary | ICD-10-CM

## 2018-10-11 DIAGNOSIS — E109 Type 1 diabetes mellitus without complications: Secondary | ICD-10-CM | POA: Diagnosis not present

## 2018-10-11 DIAGNOSIS — R05 Cough: Secondary | ICD-10-CM

## 2018-10-11 DIAGNOSIS — K5904 Chronic idiopathic constipation: Secondary | ICD-10-CM

## 2018-10-11 LAB — BAYER DCA HB A1C WAIVED: HB A1C (BAYER DCA - WAIVED): 7.2 % — ABNORMAL HIGH (ref <7.0)

## 2018-10-11 LAB — HM MAMMOGRAPHY

## 2018-10-11 MED ORDER — LEVOTHYROXINE SODIUM 125 MCG PO TABS: 125.0000 ug | ORAL_TABLET | Freq: Every day | ORAL | 1 refills | Status: DC

## 2018-10-11 MED ORDER — RANITIDINE HCL 150 MG PO TABS: 150.0000 mg | ORAL_TABLET | Freq: Two times a day (BID) | ORAL | 1 refills | Status: DC

## 2018-10-11 MED ORDER — SIMVASTATIN 40 MG PO TABS: 40.0000 mg | ORAL_TABLET | Freq: Every evening | ORAL | 1 refills | Status: DC

## 2018-10-11 NOTE — Progress Notes (Signed)
Subjective:    Patient ID: Alejandra Mcdonald, female    DOB: [birth year], 59 y.o.   MRN: [id number]   Chief Complaint: medical management of chronic issues  HPI:  1. Type 1 diabetes mellitus with target hemoglobin A1c of less than 7.5 percent (HCC)  last hgba1c was 7.3. she has an insulin pump and has had for many years. Her blood sugars have been good. She count carbs with every meal.  2. Hyperlipidemia with target LDL less than 100  Does not eat fried foods. Does very little exercise.  3. Hypothyroidism, unspecified type  No problems that she is aware of.  4. Chronic idiopathic constipation  She does miralax daily. Will usually have to take laxative in order to have bowel movement.    Outpatient Encounter Medications as of 10/11/2018  Medication Sig  . aspirin 81 MG tablet Take 81 mg by mouth daily.    . benzonatate (TESSALON) 100 MG capsule Take 1 capsule (100 mg total) by mouth 2 (two) times daily as needed for cough.  . calcium citrate-vitamin D 200-200 MG-UNIT TABS Take 1 tablet by mouth 2 (two) times daily.   . cefdinir (OMNICEF) 300 MG capsule Take 1 capsule (300 mg total) by mouth 2 (two) times daily. 1 po BID  . Continuous Blood Gluc Receiver (FREESTYLE LIBRE 14 DAY READER) DEVI 1 Device by Does not apply route daily.  . Continuous Blood Gluc Sensor (FREESTYLE LIBRE 14 DAY SENSOR) MISC 1 Device by Does not apply route every 14 (fourteen) days.  . cyclobenzaprine (FLEXERIL) 10 MG tablet Take 1 tablet (10 mg total) by mouth 3 (three) times daily as needed for muscle spasms.  . insulin aspart (NOVOLOG) 100 UNIT/ML injection Per pump up to 100 units per day  . levothyroxine (SYNTHROID, LEVOTHROID) 125 MCG tablet Take 1 tablet (125 mcg total) by mouth daily.  . magic mouthwash w/lidocaine SOLN Gargle and spit 97m every 6 hours as needed for sore throat (Patient not taking: Reported on 08/06/2018)  . Multiple Vitamins-Minerals (MULTIVITAMIN,TX-MINERALS) tablet Take 1 tablet by mouth  daily.    . naproxen (NAPROSYN) 500 MG tablet Take 1 tablet (500 mg total) by mouth 2 (two) times daily with a meal.  . polyethylene glycol powder (GLYCOLAX/MIRALAX) powder MIX 17GRAMS (1 CAPFUL) WITH 8 OUNCES OF LIQUID AND DRINK BY MOUTH  TID (Patient taking differently: Take 17 g by mouth daily. MIX 17GRAMS (1 CAPFUL) WITH 8 OUNCES OF LIQUID AND DRINK BY MOUTH  TID)  . ranitidine (ZANTAC) 150 MG tablet Take 1 tablet (150 mg total) by mouth 2 (two) times daily.  . simvastatin (ZOCOR) 40 MG tablet Take 1 tablet (40 mg total) by mouth every evening.      New complaints: None today  Social history: Has a new granddaughter that she is enjoying  Review of Systems  Constitutional: Negative for activity change and appetite change.  HENT: Negative.   Eyes: Negative for pain.  Respiratory: Negative for shortness of breath.   Cardiovascular: Negative for chest pain, palpitations and leg swelling.  Gastrointestinal: Negative for abdominal pain.  Endocrine: Negative for polydipsia.  Genitourinary: Negative.   Skin: Negative for rash.  Neurological: Negative for dizziness, weakness and headaches.  Hematological: Does not bruise/bleed easily.  Psychiatric/Behavioral: Negative.   All other systems reviewed and are negative.      Objective:   Physical Exam Vitals signs and nursing note reviewed.  Constitutional:      General: She is not in acute distress.  Appearance: Normal appearance. She is well-developed.  HENT:     Head: Normocephalic.     Nose: Nose normal.  Eyes:     Pupils: Pupils are equal, round, and reactive to light.  Neck:     Musculoskeletal: Normal range of motion and neck supple.     Vascular: No carotid bruit or JVD.  Cardiovascular:     Rate and Rhythm: Normal rate and regular rhythm.     Heart sounds: Normal heart sounds.  Pulmonary:     Effort: Pulmonary effort is normal. No respiratory distress.     Breath sounds: Normal breath sounds. No wheezing or rales.    Chest:     Chest wall: No tenderness.  Abdominal:     General: Bowel sounds are normal. There is no distension or abdominal bruit.     Palpations: Abdomen is soft. There is no hepatomegaly, splenomegaly, mass or pulsatile mass.     Tenderness: There is no abdominal tenderness.  Musculoskeletal: Normal range of motion.  Lymphadenopathy:     Cervical: No cervical adenopathy.  Skin:    General: Skin is warm and dry.  Neurological:     Mental Status: She is alert and oriented to person, place, and time.     Deep Tendon Reflexes: Reflexes are normal and symmetric.  Psychiatric:        Behavior: Behavior normal.        Thought Content: Thought content normal.        Judgment: Judgment normal.    BP 122/73   Pulse 67   Temp (!) 97.3 F (36.3 C) (Oral)   Ht 5' 7"  (1.702 m)   Wt 145 lb (65.8 kg)   BMI 22.71 kg/m   hgba1c 7.2%      Assessment & Plan:  Mallori L Oxley comes in today with chief complaint of Medical Management of Chronic Issues   Diagnosis and orders addressed:  1. Type 1 diabetes mellitus with target hemoglobin A1c of less than 7.5 percent (HCC) Continue to watch carbs in diet - Bayer DCA Hb A1c Waived - Microalbumin / creatinine urine ratio  2. Hyperlipidemia with target LDL less than 100 Low fat diet - CMP14+EGFR - Lipid panel - simvastatin (ZOCOR) 40 MG tablet; Take 1 tablet (40 mg total) by mouth every evening.  Dispense: 90 tablet; Refill: 1  3. Hypothyroidism, unspecified type Labs pending - Thyroid Panel With TSH - levothyroxine (SYNTHROID, LEVOTHROID) 125 MCG tablet; Take 1 tablet (125 mcg total) by mouth daily.  Dispense: 90 tablet; Refill: 1  4. Chronic idiopathic constipation  5. Cough Avoid spicy foods Do not eat 2 hours prior to bedtime - ranitidine (ZANTAC) 150 MG tablet; Take 1 tablet (150 mg total) by mouth 2 (two) times daily.  Dispense: 180 tablet; Refill: 1   Labs pending Health Maintenance reviewed Diet and exercise  encouraged  Follow up plan: 3 months   Mary-Margaret Hassell Done, FNP

## 2018-10-12 LAB — CMP14+EGFR
A/G RATIO: 3.3 — AB (ref 1.2–2.2)
ALT: 16 IU/L (ref 0–32)
AST: 21 IU/L (ref 0–40)
Albumin: 4.6 g/dL (ref 3.8–4.9)
Alkaline Phosphatase: 62 IU/L (ref 39–117)
BUN/Creatinine Ratio: 14 (ref 9–23)
BUN: 12 mg/dL (ref 6–24)
Bilirubin Total: 0.2 mg/dL (ref 0.0–1.2)
CO2: 29 mmol/L (ref 20–29)
Calcium: 9.9 mg/dL (ref 8.7–10.2)
Chloride: 96 mmol/L (ref 96–106)
Creatinine, Ser: 0.86 mg/dL (ref 0.57–1.00)
GFR calc Af Amer: 86 mL/min/{1.73_m2} (ref >59)
GFR calc non Af Amer: 75 mL/min/{1.73_m2} (ref >59)
Globulin, Total: 1.4 g/dL — ABNORMAL LOW (ref 1.5–4.5)
Glucose: 137 mg/dL — ABNORMAL HIGH (ref 65–99)
POTASSIUM: 4 mmol/L (ref 3.5–5.2)
Sodium: 138 mmol/L (ref 134–144)
Total Protein: 6 g/dL (ref 6.0–8.5)

## 2018-10-12 LAB — LIPID PANEL
Chol/HDL Ratio: 2.1 ratio (ref 0.0–4.4)
Cholesterol, Total: 206 mg/dL — ABNORMAL HIGH (ref 100–199)
HDL: 98 mg/dL (ref >39)
LDL CALC: 99 mg/dL (ref 0–99)
TRIGLYCERIDES: 45 mg/dL (ref 0–149)
VLDL Cholesterol Cal: 9 mg/dL (ref 5–40)

## 2018-10-12 LAB — THYROID PANEL WITH TSH
Free Thyroxine Index: 2.3 (ref 1.2–4.9)
T3 Uptake Ratio: 27 % (ref 24–39)
T4, Total: 8.6 ug/dL (ref 4.5–12.0)
TSH: 3.37 u[IU]/mL (ref 0.450–4.500)

## 2018-10-12 LAB — MICROALBUMIN / CREATININE URINE RATIO
Creatinine, Urine: 19.3 mg/dL
Microalb/Creat Ratio: <16 mg/g creat (ref 0–29)
Microalbumin, Urine: <3 ug/mL

## 2018-10-17 ENCOUNTER — Telehealth (INDEPENDENT_AMBULATORY_CARE_PROVIDER_SITE_OTHER): Payer: Self-pay | Admitting: *Deleted

## 2018-10-17 ENCOUNTER — Encounter (INDEPENDENT_AMBULATORY_CARE_PROVIDER_SITE_OTHER): Payer: Self-pay | Admitting: *Deleted

## 2018-10-17 MED ORDER — SUPREP BOWEL PREP KIT 17.5-3.13-1.6 GM/177ML PO SOLN: 1.0000 | Freq: Once | ORAL | 0 refills | Status: AC

## 2018-10-17 NOTE — Telephone Encounter (Signed)
Referring MD/PCP: Chevis Pretty   Procedure: tcs  Reason/Indication:  screening  Has patient had this procedure before?  Yes, 10 yrs ago  If so, when, by whom and where?    Is there a family history of colon cancer?  no  Who?  What age when diagnosed?    Is patient diabetic?   yes      Does patient have prosthetic heart valve or mechanical valve?  no  Do you have a pacemaker?  no  Has patient ever had endocarditis? no  Has patient had joint replacement within last 12 months?  no  Is patient constipated or do they take laxatives? no  Does patient have a history of alcohol/drug use?  no  Is patient on blood thinner such as Coumadin, Plavix and/or Aspirin? yes  Medications: novolog insulin pump 30-45 units daily, levothyroxine 125 mg daily, simvastatin 40 mg daily, asa 81 mg daily, calcium w vit d daily, mvi daily  Allergies: codeine  Medication Adjustment per Dr Lindi Adie, NP: asa 2 days, decrease insulin pump by 25%  Procedure date & time: 11/16/18 at 930

## 2018-10-17 NOTE — Progress Notes (Unsigned)
a 

## 2018-10-17 NOTE — Telephone Encounter (Signed)
Patient needs suprep 

## 2018-10-17 NOTE — Telephone Encounter (Signed)
agree

## 2018-10-18 ENCOUNTER — Telehealth: Payer: Self-pay | Admitting: Nurse Practitioner

## 2018-10-18 NOTE — Telephone Encounter (Signed)
Patient aware and was able to get in my chart disregard

## 2018-11-03 ENCOUNTER — Telehealth: Payer: Self-pay | Admitting: Nurse Practitioner

## 2018-11-03 DIAGNOSIS — E109 Type 1 diabetes mellitus without complications: Secondary | ICD-10-CM

## 2018-11-03 MED ORDER — INSULIN ASPART 100 UNIT/ML ~~LOC~~ SOLN: SUBCUTANEOUS | 11 refills | Status: DC

## 2018-11-03 NOTE — Telephone Encounter (Signed)
done

## 2018-11-22 DIAGNOSIS — S134XXA Sprain of ligaments of cervical spine, initial encounter: Secondary | ICD-10-CM | POA: Diagnosis not present

## 2018-11-22 DIAGNOSIS — M531 Cervicobrachial syndrome: Secondary | ICD-10-CM | POA: Diagnosis not present

## 2018-11-22 DIAGNOSIS — M546 Pain in thoracic spine: Secondary | ICD-10-CM | POA: Diagnosis not present

## 2018-11-24 DIAGNOSIS — M546 Pain in thoracic spine: Secondary | ICD-10-CM | POA: Diagnosis not present

## 2018-11-24 DIAGNOSIS — M531 Cervicobrachial syndrome: Secondary | ICD-10-CM | POA: Diagnosis not present

## 2018-11-24 DIAGNOSIS — S134XXA Sprain of ligaments of cervical spine, initial encounter: Secondary | ICD-10-CM | POA: Diagnosis not present

## 2018-11-28 DIAGNOSIS — M531 Cervicobrachial syndrome: Secondary | ICD-10-CM | POA: Diagnosis not present

## 2018-11-28 DIAGNOSIS — M546 Pain in thoracic spine: Secondary | ICD-10-CM | POA: Diagnosis not present

## 2018-11-28 DIAGNOSIS — S134XXA Sprain of ligaments of cervical spine, initial encounter: Secondary | ICD-10-CM | POA: Diagnosis not present

## 2018-12-01 DIAGNOSIS — M546 Pain in thoracic spine: Secondary | ICD-10-CM | POA: Diagnosis not present

## 2018-12-01 DIAGNOSIS — M531 Cervicobrachial syndrome: Secondary | ICD-10-CM | POA: Diagnosis not present

## 2018-12-01 DIAGNOSIS — S134XXA Sprain of ligaments of cervical spine, initial encounter: Secondary | ICD-10-CM | POA: Diagnosis not present

## 2018-12-13 DIAGNOSIS — M546 Pain in thoracic spine: Secondary | ICD-10-CM | POA: Diagnosis not present

## 2018-12-13 DIAGNOSIS — S134XXA Sprain of ligaments of cervical spine, initial encounter: Secondary | ICD-10-CM | POA: Diagnosis not present

## 2018-12-13 DIAGNOSIS — M531 Cervicobrachial syndrome: Secondary | ICD-10-CM | POA: Diagnosis not present

## 2019-01-02 DIAGNOSIS — M5030 Other cervical disc degeneration, unspecified cervical region: Secondary | ICD-10-CM | POA: Diagnosis not present

## 2019-01-02 DIAGNOSIS — M25511 Pain in right shoulder: Secondary | ICD-10-CM | POA: Diagnosis not present

## 2019-01-16 ENCOUNTER — Ambulatory Visit: Payer: BLUE CROSS/BLUE SHIELD | Admitting: Nurse Practitioner

## 2019-01-19 DIAGNOSIS — E109 Type 1 diabetes mellitus without complications: Secondary | ICD-10-CM | POA: Diagnosis not present

## 2019-01-20 DIAGNOSIS — E109 Type 1 diabetes mellitus without complications: Secondary | ICD-10-CM | POA: Diagnosis not present

## 2019-01-20 DIAGNOSIS — Z794 Long term (current) use of insulin: Secondary | ICD-10-CM | POA: Diagnosis not present

## 2019-01-30 DIAGNOSIS — S134XXA Sprain of ligaments of cervical spine, initial encounter: Secondary | ICD-10-CM | POA: Diagnosis not present

## 2019-01-30 DIAGNOSIS — M546 Pain in thoracic spine: Secondary | ICD-10-CM | POA: Diagnosis not present

## 2019-01-30 DIAGNOSIS — M531 Cervicobrachial syndrome: Secondary | ICD-10-CM | POA: Diagnosis not present

## 2019-02-20 ENCOUNTER — Encounter (INDEPENDENT_AMBULATORY_CARE_PROVIDER_SITE_OTHER): Payer: Self-pay | Admitting: *Deleted

## 2019-02-20 ENCOUNTER — Telehealth (INDEPENDENT_AMBULATORY_CARE_PROVIDER_SITE_OTHER): Payer: Self-pay | Admitting: *Deleted

## 2019-02-20 NOTE — Telephone Encounter (Signed)
Referring MD/PCP: Chevis Pretty   Procedure: tcs  Reason/Indication:  screening  Has patient had this procedure before?  Yes, 10 yrs ago             If so, when, by whom and where?    Is there a family history of colon cancer?  no             Who?  What age when diagnosed?    Is patient diabetic?   yes                                                  Does patient have prosthetic heart valve or mechanical valve?  no  Do you have a pacemaker?  no  Has patient ever had endocarditis? no  Has patient had joint replacement within last 12 months?  no  Is patient constipated or do they take laxatives? no  Does patient have a history of alcohol/drug use?  no  Is patient on blood thinner such as Coumadin, Plavix and/or Aspirin? yes  Medications: novolog insulin pump 30-45 units daily, levothyroxine 125 mg daily, simvastatin 40 mg daily, asa 81 mg daily, calcium w vit d daily, mvi daily  Allergies: codeine  Medication Adjustment per Dr Lindi Adie, NP: asa 2 days, decrease insulin pump by 25%  Procedure date & time: 03/22/19 at 930

## 2019-02-20 NOTE — Telephone Encounter (Signed)
agree

## 2019-03-08 DIAGNOSIS — M531 Cervicobrachial syndrome: Secondary | ICD-10-CM | POA: Diagnosis not present

## 2019-03-08 DIAGNOSIS — M546 Pain in thoracic spine: Secondary | ICD-10-CM | POA: Diagnosis not present

## 2019-03-08 DIAGNOSIS — S134XXA Sprain of ligaments of cervical spine, initial encounter: Secondary | ICD-10-CM | POA: Diagnosis not present

## 2019-03-20 ENCOUNTER — Other Ambulatory Visit (HOSPITAL_COMMUNITY)
Admission: RE | Admit: 2019-03-20 | Discharge: 2019-03-20 | Disposition: A | Discharge status: Home / Self Care | Payer: BC Managed Care – PPO | Source: Ambulatory Visit | Attending: Internal Medicine | Admitting: Internal Medicine

## 2019-03-20 LAB — SARS CORONAVIRUS 2 (TAT 6-24 HRS): SARS Coronavirus 2: NEGATIVE

## 2019-03-22 ENCOUNTER — Encounter (HOSPITAL_COMMUNITY): Payer: Self-pay | Admitting: *Deleted

## 2019-03-22 ENCOUNTER — Ambulatory Visit (HOSPITAL_COMMUNITY)
Admission: RE | Admit: 2019-03-22 | Discharge: 2019-03-22 | Disposition: A | Discharge status: Home / Self Care | Payer: BC Managed Care – PPO | Attending: Internal Medicine | Admitting: Internal Medicine

## 2019-03-22 ENCOUNTER — Encounter (HOSPITAL_COMMUNITY)
Admission: RE | Disposition: A | Discharge status: Home / Self Care | Payer: Self-pay | Source: Home / Self Care | Attending: Internal Medicine

## 2019-03-22 ENCOUNTER — Other Ambulatory Visit: Payer: Self-pay

## 2019-03-22 DIAGNOSIS — K5909 Other constipation: Secondary | ICD-10-CM | POA: Diagnosis not present

## 2019-03-22 DIAGNOSIS — D123 Benign neoplasm of transverse colon: Secondary | ICD-10-CM | POA: Diagnosis not present

## 2019-03-22 DIAGNOSIS — K644 Residual hemorrhoidal skin tags: Secondary | ICD-10-CM | POA: Diagnosis not present

## 2019-03-22 DIAGNOSIS — J45909 Unspecified asthma, uncomplicated: Secondary | ICD-10-CM | POA: Diagnosis not present

## 2019-03-22 DIAGNOSIS — Z79899 Other long term (current) drug therapy: Secondary | ICD-10-CM | POA: Diagnosis not present

## 2019-03-22 DIAGNOSIS — Z9841 Cataract extraction status, right eye: Secondary | ICD-10-CM | POA: Diagnosis not present

## 2019-03-22 DIAGNOSIS — Z888 Allergy status to other drugs, medicaments and biological substances status: Secondary | ICD-10-CM | POA: Diagnosis not present

## 2019-03-22 DIAGNOSIS — M1990 Unspecified osteoarthritis, unspecified site: Secondary | ICD-10-CM | POA: Diagnosis not present

## 2019-03-22 DIAGNOSIS — Z9842 Cataract extraction status, left eye: Secondary | ICD-10-CM | POA: Diagnosis not present

## 2019-03-22 DIAGNOSIS — Z79890 Hormone replacement therapy (postmenopausal): Secondary | ICD-10-CM | POA: Insufficient documentation

## 2019-03-22 DIAGNOSIS — Z1211 Encounter for screening for malignant neoplasm of colon: Secondary | ICD-10-CM | POA: Diagnosis not present

## 2019-03-22 DIAGNOSIS — E785 Hyperlipidemia, unspecified: Secondary | ICD-10-CM | POA: Insufficient documentation

## 2019-03-22 DIAGNOSIS — Z7982 Long term (current) use of aspirin: Secondary | ICD-10-CM | POA: Insufficient documentation

## 2019-03-22 DIAGNOSIS — E119 Type 2 diabetes mellitus without complications: Secondary | ICD-10-CM | POA: Insufficient documentation

## 2019-03-22 DIAGNOSIS — Z20828 Contact with and (suspected) exposure to other viral communicable diseases: Secondary | ICD-10-CM | POA: Insufficient documentation

## 2019-03-22 DIAGNOSIS — Z794 Long term (current) use of insulin: Secondary | ICD-10-CM | POA: Insufficient documentation

## 2019-03-22 DIAGNOSIS — D122 Benign neoplasm of ascending colon: Secondary | ICD-10-CM | POA: Diagnosis not present

## 2019-03-22 DIAGNOSIS — Z885 Allergy status to narcotic agent status: Secondary | ICD-10-CM | POA: Diagnosis not present

## 2019-03-22 DIAGNOSIS — E039 Hypothyroidism, unspecified: Secondary | ICD-10-CM | POA: Insufficient documentation

## 2019-03-22 HISTORY — PX: COLONOSCOPY: SHX5424

## 2019-03-22 HISTORY — PX: BIOPSY: SHX5522

## 2019-03-22 HISTORY — PX: POLYPECTOMY: SHX5525

## 2019-03-22 LAB — GLUCOSE, CAPILLARY: Glucose-Capillary: 188 mg/dL — ABNORMAL HIGH (ref 70–99)

## 2019-03-22 SURGERY — COLONOSCOPY
Anesthesia: Moderate Sedation

## 2019-03-22 MED ORDER — STERILE WATER FOR IRRIGATION IR SOLN
Status: DC | PRN
  Administered 2019-03-22: 1.5 mL

## 2019-03-22 MED ORDER — MEPERIDINE HCL 50 MG/ML IJ SOLN
INTRAMUSCULAR | Status: AC
  Filled 2019-03-22: qty 1

## 2019-03-22 MED ORDER — MIDAZOLAM HCL 5 MG/5ML IJ SOLN
INTRAMUSCULAR | Status: DC | PRN
  Administered 2019-03-22 (×3): 1 mg via INTRAVENOUS
  Administered 2019-03-22: 2 mg via INTRAVENOUS

## 2019-03-22 MED ORDER — SODIUM CHLORIDE 0.9 % IV SOLN
INTRAVENOUS | Status: DC
  Administered 2019-03-22: 09:00:00 1000 mL via INTRAVENOUS

## 2019-03-22 MED ORDER — MEPERIDINE HCL 50 MG/ML IJ SOLN
INTRAMUSCULAR | Status: DC | PRN
  Administered 2019-03-22: 25 mg

## 2019-03-22 MED ORDER — METAMUCIL SMOOTH TEXTURE 58.6 % PO POWD: 1.0000 | Freq: Every day | ORAL | 12 refills | Status: DC

## 2019-03-22 MED ORDER — ONDANSETRON HCL 4 MG/2ML IJ SOLN
INTRAMUSCULAR | Status: DC | PRN
  Administered 2019-03-22: 4 mg via INTRAVENOUS

## 2019-03-22 MED ORDER — ONDANSETRON HCL 4 MG/2ML IJ SOLN
INTRAMUSCULAR | Status: AC
  Filled 2019-03-22: qty 2

## 2019-03-22 MED ORDER — MIDAZOLAM HCL 5 MG/5ML IJ SOLN
INTRAMUSCULAR | Status: AC
  Filled 2019-03-22: qty 10

## 2019-03-22 NOTE — Discharge Instructions (Signed)
No aspirin or NSAIDs for 24 hours. Resume usual medications as before. Modified carb high-fiber diet. Metamucil 4 g by mouth daily at bedtime or 3 g twice daily. No driving for 24 hours. Physician will call with biopsy results.      Colonoscopy, Adult, Care After This sheet gives you information about how to care for yourself after your procedure. Your doctor may also give you more specific instructions. If you have problems or questions, call your doctor. What can I expect after the procedure? After the procedure, it is common to have:  A small amount of blood in your poop for 24 hours.  Some gas.  Mild cramping or bloating in your belly. Follow these instructions at home: General instructions  For the first 24 hours after the procedure: ? Do not drive or use machinery. ? Do not sign important documents. ? Do not drink alcohol. ? Do your daily activities more slowly than normal. ? Eat foods that are soft and easy to digest.  Take over-the-counter or prescription medicines only as told by your doctor. To help cramping and bloating:   Try walking around.  Put heat on your belly (abdomen) as told by your doctor. Use a heat source that your doctor recommends, such as a moist heat pack or a heating pad. ? Put a towel between your skin and the heat source. ? Leave the heat on for 20-30 minutes. ? Remove the heat if your skin turns bright red. This is especially important if you cannot feel pain, heat, or cold. You can get burned. Eating and drinking   Drink enough fluid to keep your pee (urine) clear or pale yellow.  Return to your normal diet as told by your doctor. Avoid heavy or fried foods that are hard to digest.  Avoid drinking alcohol for as long as told by your doctor. Contact a doctor if:  You have blood in your poop (stool) 2-3 days after the procedure. Get help right away if:  You have more than a small amount of blood in your poop.  You see large clumps  of tissue (blood clots) in your poop.  Your belly is swollen.  You feel sick to your stomach (nauseous).  You throw up (vomit).  You have a fever.  You have belly pain that gets worse, and medicine does not help your pain. Summary  After the procedure, it is common to have a small amount of blood in your poop. You may also have mild cramping and bloating in your belly.  For the first 24 hours after the procedure, do not drive or use machinery, do not sign important documents, and do not drink alcohol.  Get help right away if you have a lot of blood in your poop, feel sick to your stomach, have a fever, or have more belly pain. This information is not intended to replace advice given to you by your health care provider. Make sure you discuss any questions you have with your health care provider. Document Released: 09/05/2010 Document Revised: 06/03/2017 Document Reviewed: 04/27/2016 Elsevier Patient Education  2020 [place].     Colon Polyps  Polyps are tissue growths inside the body. Polyps can grow in many places, including the large intestine (colon). A polyp may be a round bump or a mushroom-shaped growth. You could have one polyp or several. Most colon polyps are noncancerous (benign). However, some colon polyps can become cancerous over time. Finding and removing the polyps early can help prevent this.  What are the causes? The exact cause of colon polyps is not known. What increases the risk? You are more likely to develop this condition if you:  Have a family history of colon cancer or colon polyps.  Are older than 48 or older than 45 if you are African American.  Have inflammatory bowel disease, such as ulcerative colitis or Crohn's disease.  Have certain hereditary conditions, such as: ? Familial adenomatous polyposis. ? Lynch syndrome. ? Turcot syndrome. ? Peutz-Jeghers syndrome.  Are overweight.  Smoke cigarettes.  Do not get enough exercise.  Drink  too much alcohol.  Eat a diet that is high in fat and red meat and low in fiber.  Had childhood cancer that was treated with abdominal radiation. What are the signs or symptoms? Most polyps do not cause symptoms. If you have symptoms, they may include:  Blood coming from your rectum when having a bowel movement.  Blood in your stool. The stool may look dark red or black.  Abdominal pain.  A change in bowel habits, such as constipation or diarrhea. How is this diagnosed? This condition is diagnosed with a colonoscopy. This is a procedure in which a lighted, flexible scope is inserted into the anus and then passed into the colon to examine the area. Polyps are sometimes found when a colonoscopy is done as part of routine cancer screening tests. How is this treated? Treatment for this condition involves removing any polyps that are found. Most polyps can be removed during a colonoscopy. Those polyps will then be tested for cancer. Additional treatment may be needed depending on the results of testing. Follow these instructions at home: Lifestyle  Maintain a healthy weight, or lose weight if recommended by your health care provider.  Exercise every day or as told by your health care provider.  Do not use any products that contain nicotine or tobacco, such as cigarettes and e-cigarettes. If you need help quitting, ask your health care provider.  If you drink alcohol, limit how much you have: ? 0-1 drink a day for women. ? 0-2 drinks a day for men.  Be aware of how much alcohol is in your drink. In the [place] one drink equals one 12 oz bottle of beer (355 mL), one 5 oz glass of wine (148 mL), or one 1 oz shot of hard liquor (44 mL). Eating and drinking   Eat foods that are high in fiber, such as fruits, vegetables, and whole grains.  Eat foods that are high in calcium and vitamin D, such as milk, cheese, yogurt, eggs, liver, fish, and broccoli.  Limit foods that are high in fat,  such as fried foods and desserts.  Limit the amount of red meat and processed meat you eat, such as hot dogs, sausage, bacon, and lunch meats. General instructions  Keep all follow-up visits as told by your health care provider. This is important. ? This includes having regularly scheduled colonoscopies. ? Talk to your health care provider about when you need a colonoscopy. Contact a health care provider if:  You have new or worsening bleeding during a bowel movement.  You have new or increased blood in your stool.  You have a change in bowel habits.  You lose weight for no known reason. Summary  Polyps are tissue growths inside the body. Polyps can grow in many places, including the colon.  Most colon polyps are noncancerous (benign), but some can become cancerous over time.  This condition is diagnosed with a  colonoscopy.  Treatment for this condition involves removing any polyps that are found. Most polyps can be removed during a colonoscopy. This information is not intended to replace advice given to you by your health care provider. Make sure you discuss any questions you have with your health care provider. Document Released: 04/29/2004 Document Revised: 11/18/2017 Document Reviewed: 11/18/2017 Elsevier Patient Education  [employer].    High-Fiber Diet Fiber, also called dietary fiber, is a type of carbohydrate that is found in fruits, vegetables, whole grains, and beans. A high-fiber diet can have many health benefits. Your health care provider may recommend a high-fiber diet to help:  Prevent constipation. Fiber can make your bowel movements more regular.  Lower your cholesterol.  Relieve the following conditions: ? Swelling of veins in the anus (hemorrhoids). ? Swelling and irritation (inflammation) of specific areas of the digestive tract (uncomplicated diverticulosis). ? A problem of the large intestine (colon) that sometimes causes pain and diarrhea  (irritable bowel syndrome, IBS).  Prevent overeating as part of a weight-loss plan.  Prevent heart disease, type 2 diabetes, and certain cancers. What is my plan? The recommended daily fiber intake in grams (g) includes:  38 g for men age 85 or younger.  30 g for men over age 21.  41 g for women age 64 or younger.  21 g for women over age 48. You can get the recommended daily intake of dietary fiber by:  Eating a variety of fruits, vegetables, grains, and beans.  Taking a fiber supplement, if it is not possible to get enough fiber through your diet. What do I need to know about a high-fiber diet?  It is better to get fiber through food sources rather than from fiber supplements. There is not a lot of research about how effective supplements are.  Always check the fiber content on the nutrition facts label of any prepackaged food. Look for foods that contain 5 g of fiber or more per serving.  Talk with a diet and nutrition specialist (dietitian) if you have questions about specific foods that are recommended or not recommended for your medical condition, especially if those foods are not listed below.  Gradually increase how much fiber you consume. If you increase your intake of dietary fiber too quickly, you may have bloating, cramping, or gas.  Drink plenty of water. Water helps you to digest fiber. What are tips for following this plan?  Eat a wide variety of high-fiber foods.  Make sure that half of the grains that you eat each day are whole grains.  Eat breads and cereals that are made with whole-grain flour instead of refined flour or white flour.  Eat brown rice, bulgur wheat, or millet instead of white rice.  Start the day with a breakfast that is high in fiber, such as a cereal that contains 5 g of fiber or more per serving.  Use beans in place of meat in soups, salads, and pasta dishes.  Eat high-fiber snacks, such as berries, raw vegetables, nuts, and  popcorn.  Choose whole fruits and vegetables instead of processed forms like juice or sauce. What foods can I eat?  Fruits Berries. Pears. Apples. Oranges. Avocado. Prunes and raisins. Dried figs. Vegetables Sweet potatoes. Spinach. Kale. Artichokes. Cabbage. Broccoli. Cauliflower. Green peas. Carrots. Squash. Grains Whole-grain breads. Multigrain cereal. Oats and oatmeal. Brown rice. Barley. Bulgur wheat. [place]. Quinoa. Bran muffins. Popcorn. Rye wafer crackers. Meats and other proteins Navy, kidney, and pinto beans. Soybeans. Split peas.  Lentils. Nuts and seeds. Dairy Fiber-fortified yogurt. Beverages Fiber-fortified soy milk. Fiber-fortified orange juice. Other foods Fiber bars. The items listed above may not be a complete list of recommended foods and beverages. Contact a dietitian for more options. What foods are not recommended? Fruits Fruit juice. Cooked, strained fruit. Vegetables Fried potatoes. Canned vegetables. Well-cooked vegetables. Grains White bread. Pasta made with refined flour. White rice. Meats and other proteins Fatty cuts of meat. Fried chicken or fried fish. Dairy Milk. Yogurt. Cream cheese. Sour cream. Fats and oils Butters. Beverages Soft drinks. Other foods Cakes and pastries. The items listed above may not be a complete list of foods and beverages to avoid. Contact a dietitian for more information. Summary  Fiber is a type of carbohydrate. It is found in fruits, vegetables, whole grains, and beans.  There are many health benefits of eating a high-fiber diet, such as preventing constipation, lowering blood cholesterol, helping with weight loss, and reducing your risk of heart disease, diabetes, and certain cancers.  Gradually increase your intake of fiber. Increasing too fast can result in cramping, bloating, and gas. Drink plenty of water while you increase your fiber.  The best sources of fiber include whole fruits and vegetables, whole  grains, nuts, seeds, and beans. This information is not intended to replace advice given to you by your health care provider. Make sure you discuss any questions you have with your health care provider. Document Released: 08/03/2005 Document Revised: 06/07/2017 Document Reviewed: 06/07/2017 Elsevier Patient Education  2020 [employer].

## 2019-03-22 NOTE — H&P (Signed)
Alejandra Mcdonald is an 58 y.o. female.   Chief Complaint: Patient is here for colonoscopy. HPI: Patient is 58 year old Caucasian female who is here for screening colonoscopy.  Last exam was 10 years ago.  It was normal.  She has chronic constipation.  She denies abdominal pain or rectal bleeding.  She uses milk of magnesia once a week. He does have difficulty cleaning herself after bowel movement because of tags or hemorrhoids. Family history is negative for CRC.  Past Medical History:  Diagnosis Date  . Arthritis   . Asthma    as child   no problem in 3 years  . Cataract    bilateral  . Diabetes mellitus without complication ([place])   . Hyperlipidemia   . Hypothyroidism   . PONV (postoperative nausea and vomiting)   . Thyroid disease   . Vasomotor phenomenon     Past Surgical History:  Procedure Laterality Date  . APPENDECTOMY    . BACK SURGERY  03/2015  . BILATERAL CARPAL TUNNEL RELEASE    . CATARACT EXTRACTION Bilateral may & june 2017  . CESAREAN SECTION    . TUBAL LIGATION      Family History  Problem Relation Age of Onset  . Hypertension Mother   . Diabetes Mother   . Hypertension Father   . Healthy Brother   . Healthy Brother   . Healthy Brother    Social History:  reports that she has never smoked. She has never used smokeless tobacco. She reports that she does not drink alcohol or use drugs.  Allergies:  Allergies  Allergen Reactions  . Ace Inhibitors Cough  . Baclofen     Raises blood sugar  . Codeine Nausea And Vomiting  . Lipitor [Atorvastatin] Other (See Comments)    Myalgias    Medications Prior to Admission  Medication Sig Dispense Refill  . aspirin 81 MG tablet Take 81 mg by mouth daily.      . Calcium Citrate-Vitamin D (CALCIUM + D PO) Take 1 tablet by mouth 2 (two) times a day.    . Carboxymethylcellul-Glycerin (LUBRICATING EYE DROPS OP) Place 1 drop into both eyes daily as needed (dry eyes).    Marland Kitchen ibuprofen (ADVIL) 200 MG tablet Take 600 mg by  mouth every 6 (six) hours as needed for headache or moderate pain.    Marland Kitchen insulin aspart (NOVOLOG) 100 UNIT/ML injection Per pump up to 100 units per day (Patient taking differently: Inject 35-45 Units into the skin See admin instructions. Per pump up to 35-45 units per day) 3 vial 11  . levothyroxine (SYNTHROID, LEVOTHROID) 125 MCG tablet Take 1 tablet (125 mcg total) by mouth daily. 90 tablet 1  . magnesium hydroxide (MILK OF MAGNESIA) 400 MG/5ML suspension Take 30 mLs by mouth daily as needed for mild constipation.    . Multiple Vitamins-Minerals (MULTIVITAMIN,TX-MINERALS) tablet Take 1 tablet by mouth every evening.     . simvastatin (ZOCOR) 40 MG tablet Take 1 tablet (40 mg total) by mouth every evening. 90 tablet 1  . Continuous Blood Gluc Receiver (FREESTYLE LIBRE 14 DAY READER) DEVI 1 Device by Does not apply route daily. 2 Device 5  . Continuous Blood Gluc Sensor (FREESTYLE LIBRE 14 DAY SENSOR) MISC 1 Device by Does not apply route every 14 (fourteen) days. 2 each 5  . Ibuprofen-diphenhydrAMINE HCl (ADVIL PM) 200-25 MG CAPS Take 2 tablets by mouth at bedtime as needed (sleep).      Results for orders placed or performed during  the hospital encounter of 03/22/19 (from the past 48 hour(s))  Glucose, capillary     Status: Abnormal   Collection Time: 03/22/19  9:05 AM  Result Value Ref Range   Glucose-Capillary 188 (H) 70 - 99 mg/dL   No results found.  ROS  Blood pressure (!) 123/93, pulse 66, temperature 97.8 F (36.6 C), temperature source Oral, resp. rate 16, height 5\' 7"  (1.702 m), weight 68 kg, SpO2 98 %. Physical Exam  Constitutional: She appears well-developed and well-nourished.  HENT:  Mouth/Throat: Oropharynx is clear and moist.  Eyes: Conjunctivae are normal. No scleral icterus.  Neck: No thyromegaly present.  Cardiovascular: Normal rate, regular rhythm and normal heart sounds.  No murmur heard. Respiratory: Effort normal and breath sounds normal.  GI:  Abdomen is  symmetrical.  She has Pfannenstiel scar.  Abdomen is soft and nontender with organomegaly or masses.  Musculoskeletal:        General: No edema.  Lymphadenopathy:    She has no cervical adenopathy.  Neurological: She is alert.  Skin: Skin is warm and dry.     Assessment/Plan Average risk screening colonoscopy.  Hildred Laser, MD 03/22/2019, 9:45 AM

## 2019-03-22 NOTE — Op Note (Signed)
[facility] Patient Name: Alejandra Mcdonald Procedure Date: 03/22/2019 9:38 AM MRN: [id number] Date of Birth: [birth year] Attending MD: Hildred Laser , MD CSN: [id number] Age: 58 Admit Type: Outpatient Procedure:                Colonoscopy Indications:              Screening for colorectal malignant neoplasm Providers:                Hildred Laser, MD, Gerome Sam, RN, Nelma Rothman,                            Technician Referring MD:             Chevis Pretty FNP Medicines:                Meperidine 25 mg IV, Midazolam 4 mg IV Complications:            No immediate complications. Estimated Blood Loss:     Estimated blood loss was minimal. Procedure:                Pre-Anesthesia Assessment:                           - Prior to the procedure, a History and Physical                            was performed, and patient medications and                            allergies were reviewed. The patient's tolerance of                            previous anesthesia was also reviewed. The risks                            and benefits of the procedure and the sedation                            options and risks were discussed with the patient.                            All questions were answered, and informed consent                            was obtained. Prior Anticoagulants: The patient has                            taken no previous anticoagulant or antiplatelet                            agents. ASA Grade Assessment: II - A patient with                            mild systemic disease. After reviewing the risks  and benefits, the patient was deemed in                            satisfactory condition to undergo the procedure.                           After obtaining informed consent, the colonoscope                            was passed under direct vision. Throughout the                            procedure, the patient's blood pressure, pulse, and                          oxygen saturations were monitored continuously. The                            [id number]-[id number] ([id number]) scope was introduced through                            the anus and advanced to the the cecum, identified                            by appendiceal orifice and ileocecal valve. The                            colonoscopy was performed without difficulty. The                            patient tolerated the procedure well. The quality                            of the bowel preparation was {skip}marginal prep.                            The ileocecal valve, appendiceal orifice, and                            rectum were photographed. Scope In: 9:53:41 AM Scope Out: 10:19:06 AM Scope Withdrawal Time: 0 hours 14 minutes 41 seconds  Total Procedure Duration: 0 hours 25 minutes 25 seconds  Findings:      Skin tags were found on perianal exam.      The digital rectal exam was normal.      Two sessile polyps were found in the ascending colon. The polyps were       small in size.      These were biopsied with a cold forceps for histology. The pathology       specimen was placed into Bottle Number 1.      A 6 mm polyp was found in the hepatic flexure. The polyp was       multi-lobulated. The polyp was removed with a cold snare. Resection and       retrieval were complete. The pathology specimen was placed into Bottle  Number 2.      The exam was otherwise normal throughout the examined colon.      External hemorrhoids were found during retroflexion. The hemorrhoids       were small. Impression:               - Perianal skin tags found on perianal exam.                           - Two small polyps in the ascending colon. Biopsied.                           - One 6 mm polyp at the hepatic flexure, removed                            with a cold snare. Resected and retrieved.                           - External hemorrhoids. Moderate Sedation:      Moderate (conscious)  sedation was administered by the endoscopy nurse       and supervised by the endoscopist. The following parameters were       monitored: oxygen saturation, heart rate, blood pressure, CO2       capnography and response to care. Total physician intraservice time was       29 minutes. Recommendation:           - Patient has a contact number available for                            emergencies. The signs and symptoms of potential                            delayed complications were discussed with the                            patient. Return to normal activities tomorrow.                            Written discharge instructions were provided to the                            patient.                           - High fiber diet and diabetic (ADA) diet today.                           - Continue present medications.                           - No aspirin, ibuprofen, naproxen, or other                            non-steroidal anti-inflammatory drugs for 1 day.                           -  Await pathology results.                           - Repeat colonoscopy is recommended. The                            colonoscopy date will be determined after pathology                            results from today's exam become available for                            review. Procedure Code(s):        --- Professional ---                           [phone], Colonoscopy, flexible; with removal of                            tumor(s), polyp(s), or other lesion(s) by snare                            technique                           45380, 58, Colonoscopy, flexible; with biopsy,                            single or multiple                           99153, Moderate sedation; each additional 15                            minutes intraservice time                           G0500, Moderate sedation services provided by the                            same physician or other qualified health care                             professional performing a gastrointestinal                            endoscopic service that sedation supports,                            requiring the presence of an independent trained                            observer to assist in the monitoring of the                            patient's level of consciousness and physiological  status; initial 15 minutes of intra-service time;                            patient age 62 years or older (additional time may                            be reported with [phone], as appropriate) Diagnosis Code(s):        --- Professional ---                           K63.5, Polyp of colon                           K64.4, Residual hemorrhoidal skin tags                           Z12.11, Encounter for screening for malignant                            neoplasm of colon CPT copyright 2019 [employer]. All rights reserved. The codes documented in this report are preliminary and upon coder review may  be revised to meet current compliance requirements. Hildred Laser, MD Hildred Laser, MD 03/22/2019 10:29:59 AM This report has been signed electronically. Number of Addenda: 0

## 2019-04-04 ENCOUNTER — Encounter (HOSPITAL_COMMUNITY): Payer: Self-pay | Admitting: Internal Medicine

## 2019-04-13 ENCOUNTER — Other Ambulatory Visit: Payer: Self-pay

## 2019-04-14 ENCOUNTER — Ambulatory Visit: Payer: BLUE CROSS/BLUE SHIELD | Admitting: Nurse Practitioner

## 2019-04-14 ENCOUNTER — Encounter: Payer: Self-pay | Admitting: Nurse Practitioner

## 2019-04-14 ENCOUNTER — Other Ambulatory Visit: Payer: Self-pay

## 2019-04-14 VITALS — BP 115/63 | HR 69 | Temp 98.6°F | Ht 67.0 in | Wt 154.0 lb

## 2019-04-14 DIAGNOSIS — E039 Hypothyroidism, unspecified: Secondary | ICD-10-CM

## 2019-04-14 DIAGNOSIS — K5904 Chronic idiopathic constipation: Secondary | ICD-10-CM

## 2019-04-14 DIAGNOSIS — E785 Hyperlipidemia, unspecified: Secondary | ICD-10-CM

## 2019-04-14 DIAGNOSIS — E109 Type 1 diabetes mellitus without complications: Secondary | ICD-10-CM

## 2019-04-14 LAB — BAYER DCA HB A1C WAIVED: HB A1C (BAYER DCA - WAIVED): 7.1 % — ABNORMAL HIGH (ref <7.0)

## 2019-04-14 MED ORDER — LEVOTHYROXINE SODIUM 125 MCG PO TABS: 125.0000 ug | ORAL_TABLET | Freq: Every day | ORAL | 1 refills | Status: DC

## 2019-04-14 MED ORDER — SIMVASTATIN 40 MG PO TABS: 40.0000 mg | ORAL_TABLET | Freq: Every evening | ORAL | 1 refills | Status: DC

## 2019-04-14 NOTE — Progress Notes (Signed)
Subjective:    Patient ID: Alejandra Mcdonald, female    DOB: 25-Nov-1960, 58 y.o.   MRN: [id number]   Chief Complaint: Medical Management of Chronic Issues    HPI:  1. Type 1 diabetes mellitus with target hemoglobin A1c of less than 7.5 percent (HCC) Is on insulin pump and is doing well.  Lab Results  Component Value Date   HGBA1C 7.2 (H) 10/11/2018     2. Hyperlipidemia with target LDL less than 100 She does watch diet and exercises daily. Lab Results  Component Value Date   CHOL 206 (H) 10/11/2018   HDL 98 10/11/2018   LDLCALC 99 10/11/2018   TRIG 45 10/11/2018   CHOLHDL 2.1 10/11/2018     3. Hypothyroidism, unspecified type No problems that aware of  4. Chronic idiopathic constipation Is some better. Metamucil helps some.    Outpatient Encounter Medications as of 04/14/2019  Medication Sig  . aspirin 81 MG tablet Take 81 mg by mouth daily.    . Calcium Citrate-Vitamin D (CALCIUM + D PO) Take 1 tablet by mouth 2 (two) times a day.  . Continuous Blood Gluc Receiver (FREESTYLE LIBRE 14 DAY READER) DEVI 1 Device by Does not apply route daily.  . Continuous Blood Gluc Sensor (FREESTYLE LIBRE 14 DAY SENSOR) MISC 1 Device by Does not apply route every 14 (fourteen) days.  Marland Kitchen ibuprofen (ADVIL) 200 MG tablet Take 600 mg by mouth every 6 (six) hours as needed for headache or moderate pain.  . Ibuprofen-diphenhydrAMINE HCl (ADVIL PM) 200-25 MG CAPS Take 2 tablets by mouth at bedtime as needed (sleep).  . insulin aspart (NOVOLOG) 100 UNIT/ML injection Per pump up to 100 units per day (Patient taking differently: Inject 35-45 Units into the skin See admin instructions. Per pump up to 35-45 units per day)  . levothyroxine (SYNTHROID, LEVOTHROID) 125 MCG tablet Take 1 tablet (125 mcg total) by mouth daily.  . magnesium hydroxide (MILK OF MAGNESIA) 400 MG/5ML suspension Take 30 mLs by mouth daily as needed for mild constipation.  . Multiple Vitamins-Minerals (MULTIVITAMIN,TX-MINERALS)  tablet Take 1 tablet by mouth every evening.   . psyllium (METAMUCIL SMOOTH TEXTURE) 58.6 % powder Take 1 packet by mouth daily.  . simvastatin (ZOCOR) 40 MG tablet Take 1 tablet (40 mg total) by mouth every evening.    Past Surgical History:  Procedure Laterality Date  . APPENDECTOMY    . BACK SURGERY  03/2015  . BILATERAL CARPAL TUNNEL RELEASE    . BIOPSY  03/22/2019   Procedure: BIOPSY;  Surgeon: Rogene Houston, MD;  Location: [facility];  Service: Endoscopy;;  ascending colon polyp biopsy  . CATARACT EXTRACTION Bilateral may & june 2017  . CESAREAN SECTION    . COLONOSCOPY N/A 03/22/2019   Procedure: COLONOSCOPY;  Surgeon: Rogene Houston, MD;  Location: [facility];  Service: Endoscopy;  Laterality: N/A;  930  . POLYPECTOMY  03/22/2019   Procedure: POLYPECTOMY;  Surgeon: Rogene Houston, MD;  Location: [facility];  Service: Endoscopy;;  ascending colon, hepatic flexure  . TUBAL LIGATION      Family History  Problem Relation Age of Onset  . Hypertension Mother   . Diabetes Mother   . Hypertension Father   . Healthy Brother   . Healthy Brother   . Healthy Brother     New complaints: None today  Social history: Has a son with MD that just recently had a baby- baby is healthy Controlled substance contract:  n/a    Review of Systems  Constitutional: Negative for activity change and appetite change.  HENT: Negative.   Eyes: Negative for pain.  Respiratory: Negative for shortness of breath.   Cardiovascular: Negative for chest pain, palpitations and leg swelling.  Gastrointestinal: Negative for abdominal pain.  Endocrine: Negative for polydipsia.  Genitourinary: Negative.   Skin: Negative for rash.  Neurological: Negative for dizziness, weakness and headaches.  Hematological: Does not bruise/bleed easily.  Psychiatric/Behavioral: Negative.   All other systems reviewed and are negative.      Objective:   Physical Exam Vitals signs and nursing note  reviewed.  Constitutional:      General: She is not in acute distress.    Appearance: Normal appearance. She is well-developed.  HENT:     Head: Normocephalic.     Nose: Nose normal.  Eyes:     Pupils: Pupils are equal, round, and reactive to light.  Neck:     Musculoskeletal: Normal range of motion and neck supple.     Vascular: No carotid bruit or JVD.  Cardiovascular:     Rate and Rhythm: Normal rate and regular rhythm.     Heart sounds: Normal heart sounds.  Pulmonary:     Effort: Pulmonary effort is normal. No respiratory distress.     Breath sounds: Normal breath sounds. No wheezing or rales.  Chest:     Chest wall: No tenderness.  Abdominal:     General: Bowel sounds are normal. There is no distension or abdominal bruit.     Palpations: Abdomen is soft. There is no hepatomegaly, splenomegaly, mass or pulsatile mass.     Tenderness: There is no abdominal tenderness.  Musculoskeletal: Normal range of motion.  Lymphadenopathy:     Cervical: No cervical adenopathy.  Skin:    General: Skin is warm and dry.  Neurological:     Mental Status: She is alert and oriented to person, place, and time.     Deep Tendon Reflexes: Reflexes are normal and symmetric.  Psychiatric:        Behavior: Behavior normal.        Thought Content: Thought content normal.        Judgment: Judgment normal.    BP 115/63   Pulse 69   Temp 98.6 F (37 C) (Oral)   Ht 5' 7"  (1.702 m)   Wt 154 lb (69.9 kg)   BMI 24.12 kg/m   hgba1c 7.1%       Assessment & Plan:  Alejandra Mcdonald comes in today with chief complaint of Medical Management of Chronic Issues   Diagnosis and orders addressed:  1. Type 1 diabetes mellitus with target hemoglobin A1c of less than 7.5 percent (HCC) contineu to watch carbs in diet - Bayer DCA Hb A1c Waived - CMP14+EGFR - Microalbumin / creatinine urine ratio  2. Hyperlipidemia with target LDL less than 100 Low fat diet - Lipid panel - simvastatin (ZOCOR) 40 MG  tablet; Take 1 tablet (40 mg total) by mouth every evening.  Dispense: 90 tablet; Refill: 1  3. Hypothyroidism, unspecified type - levothyroxine (SYNTHROID) 125 MCG tablet; Take 1 tablet (125 mcg total) by mouth daily.  Dispense: 90 tablet; Refill: 1  4. Chronic idiopathic constipation Continue metamucil   Labs pending Health Maintenance reviewed Diet and exercise encouraged  Follow up plan:  3 months   [place], FNP

## 2019-04-15 LAB — CMP14+EGFR
ALT: 20 IU/L (ref 0–32)
AST: 24 IU/L (ref 0–40)
Albumin/Globulin Ratio: 1.9 (ref 1.2–2.2)
Albumin: 3.8 g/dL (ref 3.8–4.9)
Alkaline Phosphatase: 59 IU/L (ref 39–117)
BUN/Creatinine Ratio: 11 (ref 9–23)
BUN: 10 mg/dL (ref 6–24)
Bilirubin Total: 0.2 mg/dL (ref 0.0–1.2)
CO2: 28 mmol/L (ref 20–29)
Calcium: 9.6 mg/dL (ref 8.7–10.2)
Chloride: 98 mmol/L (ref 96–106)
Creatinine, Ser: 0.95 mg/dL (ref 0.57–1.00)
GFR calc Af Amer: 76 mL/min/{1.73_m2} (ref >59)
GFR calc non Af Amer: 66 mL/min/{1.73_m2} (ref >59)
Globulin, Total: 2 g/dL (ref 1.5–4.5)
Glucose: 279 mg/dL — ABNORMAL HIGH (ref 65–99)
Potassium: 4.1 mmol/L (ref 3.5–5.2)
Sodium: 139 mmol/L (ref 134–144)
Total Protein: 5.8 g/dL — ABNORMAL LOW (ref 6.0–8.5)

## 2019-04-15 LAB — LIPID PANEL
Chol/HDL Ratio: 2 ratio (ref 0.0–4.4)
Cholesterol, Total: 164 mg/dL (ref 100–199)
HDL: 84 mg/dL (ref >39)
LDL Calculated: 67 mg/dL (ref 0–99)
Triglycerides: 65 mg/dL (ref 0–149)
VLDL Cholesterol Cal: 13 mg/dL (ref 5–40)

## 2019-04-17 DIAGNOSIS — Z794 Long term (current) use of insulin: Secondary | ICD-10-CM | POA: Diagnosis not present

## 2019-04-17 DIAGNOSIS — E109 Type 1 diabetes mellitus without complications: Secondary | ICD-10-CM | POA: Diagnosis not present

## 2019-04-20 ENCOUNTER — Ambulatory Visit (INDEPENDENT_AMBULATORY_CARE_PROVIDER_SITE_OTHER): Payer: BC Managed Care – PPO | Admitting: General Surgery

## 2019-04-20 ENCOUNTER — Other Ambulatory Visit: Payer: Self-pay

## 2019-04-20 ENCOUNTER — Encounter: Payer: Self-pay | Admitting: General Surgery

## 2019-04-20 VITALS — BP 138/79 | HR 73 | Temp 97.8°F | Resp 16 | Ht 67.0 in | Wt 156.0 lb

## 2019-04-20 DIAGNOSIS — K644 Residual hemorrhoidal skin tags: Secondary | ICD-10-CM

## 2019-04-20 DIAGNOSIS — S134XXA Sprain of ligaments of cervical spine, initial encounter: Secondary | ICD-10-CM | POA: Diagnosis not present

## 2019-04-20 DIAGNOSIS — K5904 Chronic idiopathic constipation: Secondary | ICD-10-CM

## 2019-04-20 DIAGNOSIS — M546 Pain in thoracic spine: Secondary | ICD-10-CM | POA: Diagnosis not present

## 2019-04-20 DIAGNOSIS — R198 Other specified symptoms and signs involving the digestive system and abdomen: Secondary | ICD-10-CM | POA: Diagnosis not present

## 2019-04-20 DIAGNOSIS — M531 Cervicobrachial syndrome: Secondary | ICD-10-CM | POA: Diagnosis not present

## 2019-04-20 NOTE — Patient Instructions (Signed)
[facility]- Dr. Maryland Pink- Urogynecologist/ Versus Colorectal Surgery  Pelvic Floor Dysfunction  Pelvic floor dysfunction (PFD) is a condition that results when the group of muscles and connective tissues that support the organs in the pelvis (pelvic floor muscles) do not work well. These muscles and their connections form a sling that supports the colon and bladder. In men, these muscles also support the prostate gland. In women, they also support the uterus. PFD causes pelvic floor muscles to be too weak, too tight, or a combination of both. In PFD, muscle movements are not coordinated. This condition may cause bowel or bladder problems. It may also cause pain. What are the causes? This condition may be caused by an injury to the pelvic area or by a weakening of pelvic muscles. This often results from pregnancy and childbirth or other types of strain. In many cases, the exact cause is not known. What increases the risk? The following factors may make you more likely to develop this condition:  Having a condition of chronic bladder tissue inflammation (interstitial cystitis).  Being an older person.  Being overweight.  Radiation treatment for cancer in the pelvic region.  Previous pelvic surgery, such as removal of the uterus (hysterectomy) or prostate gland (prostatectomy). What are the signs or symptoms? Symptoms of this condition vary and may include:  Bladder symptoms, such as: ? Trouble starting urination and emptying the bladder. ? Frequent urinary tract infections. ? Leaking urine when coughing, laughing, or exercising (stress incontinence). ? Having to pass urine urgently or frequently. ? Pain when passing urine.  Bowel symptoms, such as: ? Constipation. ? Urgent or frequent bowel movements. ? Incomplete bowel movements. ? Painful bowel movements. ? Leaking stool or gas.  Unexplained genital or rectal pain.  Genital or rectal muscle spasms.  Low back pain.  In women, symptoms of PFD may also include:  A heavy, full, or aching feeling in the vagina.  A bulge that protrudes into the vagina.  Pain during or after sexual intercourse. How is this diagnosed? This condition may be diagnosed based on:  Your symptoms and medical history.  A physical exam. During the exam, your health care provider may check your pelvic muscles for tightness, spasm, pain, or weakness. This may include a rectal exam and a pelvic exam for women. In some cases, you may have diagnostic tests, such as:  Electrical muscle function tests.  Urine flow testing.  X-ray tests of bowel function.  Ultrasound of the pelvic organs. How is this treated? Treatment for this condition depends on your symptoms. Treatment options include:  Physical therapy. This may include Kegel exercises to help relax or strengthen the pelvic floor muscles.  Biofeedback. This type of therapy provides feedback on how tight your pelvic floor muscles are so that you can learn to control them.  Internal or external massage therapy.  A treatment that involves electrical stimulation of the pelvic floor muscles to help control pain (transcutaneous electrical nerve stimulation, or TENS).  Sound wave therapy (ultrasound) to reduce muscle spasms.  Medicines, such as: ? Muscle relaxants. ? Bladder control medicines. Surgery to reconstruct or support pelvic floor muscles may be an option if other treatments do not help. Follow these instructions at home: Activity  Do your usual activities as told by your health care provider. Ask your health care provider if you should modify any activities.  Do pelvic floor strengthening or relaxing exercises at home as told by your physical therapist. Lifestyle  Maintain a healthy weight.  Eat foods that are high in fiber, such as beans, whole grains, and fresh fruits and vegetables.  Limit foods that are high in fat and processed sugars, such as fried or  sweet foods.  Manage stress with relaxation techniques such as yoga or meditation. General instructions  If you have problems with leakage: ? Use absorbable pads or wear padded underwear. ? Wash frequently with mild soap. ? Keep your genital and anal area as clean and dry as possible. ? Ask your health care provider if you should try a barrier cream to prevent skin irritation.  Take warm baths to relieve pelvic muscle tension or spasms.  Take over-the-counter and prescription medicines only as told by your health care provider.  Keep all follow-up visits as told by your health care provider. This is important. Contact a health care provider if you:  Are not improving with home care.  Have signs or symptoms of PFD that get worse at home.  Develop new signs or symptoms at home.  Have signs of a urinary tract infection, such as: ? Fever. ? Chills. ? Urinary frequency. ? A burning feeling when urinating.  Have not had a bowel movement in 3 days (constipation). Summary  Pelvic floor dysfunction results when the muscles and connective tissues in your pelvic floor do not work well.  These muscles and their connections form a sling that supports your colon and bladder. In men, these muscles also support the prostate gland. In women, they also support the uterus.  PFD may be caused by an injury to the pelvic area or by a weakening of pelvic muscles.  PFD causes pelvic floor muscles to be too weak, too tight, or a combination of both. Symptoms may vary from person to person.  In most cases, PFD can be treated with physical therapies and medicines. Surgery may be an option if other treatments do not help. This information is not intended to replace advice given to you by your health care provider. Make sure you discuss any questions you have with your health care provider. Document Released: 02/21/2018 Document Revised: 02/21/2018 Document Reviewed: 02/21/2018 Elsevier Patient  Education  2020 [employer].   Hemorrhoids Hemorrhoids are swollen veins that may develop:  In the butt (rectum). These are called internal hemorrhoids.  Around the opening of the butt (anus). These are called external hemorrhoids. Hemorrhoids can cause pain, itching, or bleeding. Most of the time, they do not cause serious problems. They usually get better with diet changes, lifestyle changes, and other home treatments. What are the causes? This condition may be caused by:  Having trouble pooping (constipation).  Pushing hard (straining) to poop.  Watery poop (diarrhea).  Pregnancy.  Being very overweight (obese).  Sitting for long periods of time.  Heavy lifting or other activity that causes you to strain.  Anal sex.  Riding a bike for a long period of time. What are the signs or symptoms? Symptoms of this condition include:  Pain.  Itching or soreness in the butt.  Bleeding from the butt.  Leaking poop.  Swelling in the area.  One or more lumps around the opening of your butt. How is this diagnosed? A doctor can often diagnose this condition by looking at the affected area. The doctor may also:  Do an exam that involves feeling the area with a gloved hand (digital rectal exam).  Examine the area inside your butt using a small tube (anoscope).  Order blood tests. This may be done if you  have lost a lot of blood.  Have you get a test that involves looking inside the colon using a flexible tube with a camera on the end (sigmoidoscopy or colonoscopy). How is this treated? This condition can usually be treated at home. Your doctor may tell you to change what you eat, make lifestyle changes, or try home treatments. If these do not help, procedures can be done to remove the hemorrhoids or make them smaller. These may involve:  Placing rubber bands at the base of the hemorrhoids to cut off their blood supply.  Injecting medicine into the hemorrhoids to shrink  them.  Shining a type of light energy onto the hemorrhoids to cause them to fall off.  Doing surgery to remove the hemorrhoids or cut off their blood supply. Follow these instructions at home: Eating and drinking   Eat foods that have a lot of fiber in them. These include whole grains, beans, nuts, fruits, and vegetables.  Ask your doctor about taking products that have added fiber (fibersupplements).  Reduce the amount of fat in your diet. You can do this by: ? Eating low-fat dairy products. ? Eating less red meat. ? Avoiding processed foods.  Drink enough fluid to keep your pee (urine) pale yellow. Managing pain and swelling   Take a warm-water bath (sitz bath) for 20 minutes to ease pain. Do this 3-4 times a day. You may do this in a bathtub or using a portable sitz bath that fits over the toilet.  If told, put ice on the painful area. It may be helpful to use ice between your warm baths. ? Put ice in a plastic bag. ? Place a towel between your skin and the bag. ? Leave the ice on for 20 minutes, 2-3 times a day. General instructions  Take over-the-counter and prescription medicines only as told by your doctor. ? Medicated creams and medicines may be used as told.  Exercise often. Ask your doctor how much and what kind of exercise is best for you.  Go to the bathroom when you have the urge to poop. Do not wait.  Avoid pushing too hard when you poop.  Keep your butt dry and clean. Use wet toilet paper or moist towelettes after pooping.  Do not sit on the toilet for a long time.  Keep all follow-up visits as told by your doctor. This is important. Contact a doctor if you:  Have pain and swelling that do not get better with treatment or medicine.  Have trouble pooping.  Cannot poop.  Have pain or swelling outside the area of the hemorrhoids. Get help right away if you have:  Bleeding that will not stop. Summary  Hemorrhoids are swollen veins in the butt or  around the opening of the butt.  They can cause pain, itching, or bleeding.  Eat foods that have a lot of fiber in them. These include whole grains, beans, nuts, fruits, and vegetables.  Take a warm-water bath (sitz bath) for 20 minutes to ease pain. Do this 3-4 times a day. This information is not intended to replace advice given to you by your health care provider. Make sure you discuss any questions you have with your health care provider. Document Released: 05/12/2008 Document Revised: 08/11/2018 Document Reviewed: 12/23/2017 Elsevier Patient Education  2020 [employer].

## 2019-04-20 NOTE — Progress Notes (Signed)
[facility] History and Physical  Reason for Referral: External hemorrhoids/ skin tags  Referring Physician:  Dr. Laural Golden    Chief Complaint    Hemorrhoids      Alejandra Mcdonald is a 58 y.o. female.  HPI: Alejandra Mcdonald is a very pleasant lady who comes today to discuss her hemorrhoids. She reports a longstanding history of constipation requiring multiple medications trials in the past and reports that she has used Linzess, stool softeners, laxatives, and that now her regimen is using Milk of magnesia on Saturdays. She has a loose BM and reports that she continues to wipe after this and still has some stool coming out. She denies any leakage of stool or incontinence, but says that once she has a BM the stool will continue to wipe off on the toilet paper.  She says that she also has this sensation of never emptying her bowels and says that she feels like there is something more she needs to defecate. She has used some abdominal maneuvers to get her bowels to move but has never tried any maneuvers in her perineum or vagina.  She denies any leakage of urine with cough.  She says she otherwise has no pain or discomfort in her anal region. She says that she has no hygiene issues aside from around the time she has a BM and has to wipe repeatedly. She says that she has no bleeding.  She just feels like need to empty or a pressure in the region regularly.  She says in the past she has strained so much she "blacked out."   She says that her hemorrhoids do not cause her pain or discomfort or itching/ irritation.  She is G1P1 and had her baby via C section. She underwent a colonoscopy recently with Dr. Laural Golden and was found to have a few polyps and hemorrhoids.  She discussed her issues with him and he referred her to see me for discussion of hemorrhoid surgery.   Past Medical History:  Diagnosis Date  . Arthritis   . Asthma    as child   no problem in 3 years  . Cataract    bilateral  . Diabetes  mellitus without complication ([place])   . Hyperlipidemia   . Hypothyroidism   . PONV (postoperative nausea and vomiting)   . Thyroid disease   . Vasomotor phenomenon     Past Surgical History:  Procedure Laterality Date  . APPENDECTOMY    . BACK SURGERY  03/2015  . BILATERAL CARPAL TUNNEL RELEASE    . BIOPSY  03/22/2019   Procedure: BIOPSY;  Surgeon: Rogene Houston, MD;  Location: [facility];  Service: Endoscopy;;  ascending colon polyp biopsy  . CATARACT EXTRACTION Bilateral may & june 2017  . CESAREAN SECTION    . COLONOSCOPY N/A 03/22/2019   Procedure: COLONOSCOPY;  Surgeon: Rogene Houston, MD;  Location: [facility];  Service: Endoscopy;  Laterality: N/A;  930  . POLYPECTOMY  03/22/2019   Procedure: POLYPECTOMY;  Surgeon: Rogene Houston, MD;  Location: [facility];  Service: Endoscopy;;  ascending colon, hepatic flexure  . TUBAL LIGATION      Family History  Problem Relation Age of Onset  . Hypertension Mother   . Diabetes Mother   . Hypertension Father   . Healthy Brother   . Healthy Brother   . Healthy Brother     Social History   Tobacco Use  . Smoking status: Never Smoker  .  Smokeless tobacco: Never Used  Substance Use Topics  . Alcohol use: No  . Drug use: No    Medications: I have reviewed the patient's current medications. Allergies as of 04/20/2019      Reactions   Ace Inhibitors Cough   Baclofen    Raises blood sugar   Codeine Nausea And Vomiting   Lipitor [atorvastatin] Other (See Comments)   Myalgias      Medication List       Accurate as of April 20, 2019  3:22 PM. If you have any questions, ask your nurse or doctor.        Advil PM 200-25 MG Caps Generic drug: Ibuprofen-diphenhydrAMINE HCl Take 2 tablets by mouth at bedtime as needed (sleep).   aspirin 81 MG tablet Take 81 mg by mouth daily.   CALCIUM + D PO Take 1 tablet by mouth 2 (two) times a day.   FreeStyle Libre 14 Day Reader Kerrin Mo 1 Device by Does not apply route  daily.   FreeStyle Libre 14 Day Sensor Misc 1 Device by Does not apply route every 14 (fourteen) days.   ibuprofen 200 MG tablet Commonly known as: ADVIL Take 600 mg by mouth every 6 (six) hours as needed for headache or moderate pain.   insulin aspart 100 UNIT/ML injection Commonly known as: novoLOG Per pump up to 100 units per day What changed:   how much to take  how to take this  when to take this  additional instructions   levothyroxine 125 MCG tablet Commonly known as: SYNTHROID Take 1 tablet (125 mcg total) by mouth daily.   magnesium hydroxide 400 MG/5ML suspension Commonly known as: MILK OF MAGNESIA Take 30 mLs by mouth daily as needed for mild constipation.   Metamucil Smooth Texture 58.6 % powder Generic drug: psyllium Take 1 packet by mouth daily.   multivitamin,tx-minerals tablet Take 1 tablet by mouth every evening.   simvastatin 40 MG tablet Commonly known as: ZOCOR Take 1 tablet (40 mg total) by mouth every evening.        ROS:  A comprehensive review of systems was negative except for: Gastrointestinal: positive for abdominal pain Musculoskeletal: positive for back pain and neck pain Endocrine: positive for diabetes  Blood pressure 138/79, pulse 73, temperature 97.8 F (36.6 C), temperature source Tympanic, resp. rate 16, height 5\' 7"  (1.702 m), weight 156 lb (70.8 kg), SpO2 97 %. Physical Exam Vitals signs reviewed.  Constitutional:      Appearance: Normal appearance.  HENT:     Head: Normocephalic and atraumatic.     Nose: Nose normal.     Mouth/Throat:     Mouth: Mucous membranes are moist.  Eyes:     Extraocular Movements: Extraocular movements intact.     Pupils: Pupils are equal, round, and reactive to light.  Neck:     Musculoskeletal: Normal range of motion.  Cardiovascular:     Rate and Rhythm: Normal rate and regular rhythm.  Pulmonary:     Effort: Pulmonary effort is normal.     Breath sounds: Normal breath sounds.   Abdominal:     General: There is no distension.     Palpations: Abdomen is soft.     Tenderness: There is no abdominal tenderness.  Genitourinary:    Rectum: External hemorrhoid present. No mass or anal fissure. Normal anal tone.     Comments: Some minor external hemorrhoidal skin tags, normal tone, patulous/ thin feeling anterior rectal wall, vaginal exam not performed  Musculoskeletal:  Normal range of motion.        General: No swelling.  Skin:    General: Skin is warm and dry.  Neurological:     General: No focal deficit present.     Mental Status: She is alert and oriented to person, place, and time.  Psychiatric:        Mood and Affect: Mood normal.        Behavior: Behavior normal.        Thought Content: Thought content normal.        Judgment: Judgment normal.     Results: None   Assessment & Plan:  Alejandra Mcdonald is a 58 y.o. female with complaints of incomplete emptying and pressure in the setting of constipation.  She does have some degree of hemorrhoids with external skin tags, but has no pain or bleeding.  Her pain complaints are feeling that she cannot empty completely and the constant pressure. She does have some hygiene complaints of wiping repeatedly after she has her weekly BM with MOM, but no reported discharge, itching, chronic hygiene complaints.  We discussed that I am worried she may have issues more along the lines of pelvic floor dysfunction, and that I am unsure if removing her hemorrhoids will result in improvement in her complaints, especially the complaint of not emptying.  Her anterior rectal wall does feel thinned, but I did not do a formal pelvic exam.   We discussed that we could do hemorrhoid surgery to see if this helped but that hemorrhoid surgery is very painful and requires 6-8 weeks of healing with risk of bleeding, injury to the sphincter, infection and with her the risk of no improvement in her symptoms.    She says that the sensation is more of an  annoyance at this time and not interfering with her day to day life. I have given her some information on pelvic floor dysfunction, and discussed that most treatments tend to be conservative with physical therapy/ Kegel exercises. I discussed that she would need further workup with a Colorectal Specialist versus Urogynecologist and that they may pursue things like defecography and anal manometry that we do not have specifically in the [facility].    I searched Pelvic Floor Dysfunction, and Dr. Maryland Pink is at [facility] and does work with some of these patients.    At this time, Alejandra Mcdonald is going to think about her options. She will be in touch if she wants to pursue any hemorrhoid surgery.   All questions were answered to the satisfaction of the patient.   Virl Cagey 04/20/2019, 3:22 PM

## 2019-04-22 DIAGNOSIS — R198 Other specified symptoms and signs involving the digestive system and abdomen: Secondary | ICD-10-CM | POA: Insufficient documentation

## 2019-04-22 DIAGNOSIS — K644 Residual hemorrhoidal skin tags: Secondary | ICD-10-CM | POA: Insufficient documentation

## 2019-05-10 DIAGNOSIS — E109 Type 1 diabetes mellitus without complications: Secondary | ICD-10-CM | POA: Diagnosis not present

## 2019-05-10 DIAGNOSIS — Z794 Long term (current) use of insulin: Secondary | ICD-10-CM | POA: Diagnosis not present

## 2019-05-17 DIAGNOSIS — M531 Cervicobrachial syndrome: Secondary | ICD-10-CM | POA: Diagnosis not present

## 2019-05-17 DIAGNOSIS — S134XXA Sprain of ligaments of cervical spine, initial encounter: Secondary | ICD-10-CM | POA: Diagnosis not present

## 2019-05-17 DIAGNOSIS — M546 Pain in thoracic spine: Secondary | ICD-10-CM | POA: Diagnosis not present

## 2019-06-05 ENCOUNTER — Other Ambulatory Visit: Payer: Self-pay | Admitting: Nurse Practitioner

## 2019-06-05 DIAGNOSIS — E785 Hyperlipidemia, unspecified: Secondary | ICD-10-CM

## 2019-06-12 ENCOUNTER — Ambulatory Visit (INDEPENDENT_AMBULATORY_CARE_PROVIDER_SITE_OTHER): Payer: BC Managed Care – PPO | Admitting: Nurse Practitioner

## 2019-06-12 ENCOUNTER — Encounter: Payer: Self-pay | Admitting: Nurse Practitioner

## 2019-06-12 DIAGNOSIS — F411 Generalized anxiety disorder: Secondary | ICD-10-CM

## 2019-06-12 MED ORDER — ALPRAZOLAM 0.5 MG PO TABS: 0.5000 mg | ORAL_TABLET | Freq: Two times a day (BID) | ORAL | 1 refills | Status: DC | PRN

## 2019-06-12 MED ORDER — ESCITALOPRAM OXALATE 10 MG PO TABS: 10.0000 mg | ORAL_TABLET | Freq: Every day | ORAL | 5 refills | Status: DC

## 2019-06-12 NOTE — Addendum Note (Signed)
Addended by: Chevis Pretty on: 06/12/2019 03:35 PM   Modules accepted: Orders
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
## 2019-07-18 DIAGNOSIS — M546 Pain in thoracic spine: Secondary | ICD-10-CM | POA: Diagnosis not present

## 2019-07-18 DIAGNOSIS — S134XXA Sprain of ligaments of cervical spine, initial encounter: Secondary | ICD-10-CM | POA: Diagnosis not present

## 2019-07-18 DIAGNOSIS — M531 Cervicobrachial syndrome: Secondary | ICD-10-CM | POA: Diagnosis not present

## 2019-07-24 DIAGNOSIS — Z794 Long term (current) use of insulin: Secondary | ICD-10-CM | POA: Diagnosis not present

## 2019-07-24 DIAGNOSIS — E109 Type 1 diabetes mellitus without complications: Secondary | ICD-10-CM | POA: Diagnosis not present

## 2019-07-25 DIAGNOSIS — M531 Cervicobrachial syndrome: Secondary | ICD-10-CM | POA: Diagnosis not present

## 2019-07-25 DIAGNOSIS — M546 Pain in thoracic spine: Secondary | ICD-10-CM | POA: Diagnosis not present

## 2019-07-25 DIAGNOSIS — S134XXA Sprain of ligaments of cervical spine, initial encounter: Secondary | ICD-10-CM | POA: Diagnosis not present

## 2019-07-27 DIAGNOSIS — M531 Cervicobrachial syndrome: Secondary | ICD-10-CM | POA: Diagnosis not present

## 2019-07-27 DIAGNOSIS — M546 Pain in thoracic spine: Secondary | ICD-10-CM | POA: Diagnosis not present

## 2019-07-27 DIAGNOSIS — S134XXA Sprain of ligaments of cervical spine, initial encounter: Secondary | ICD-10-CM | POA: Diagnosis not present

## 2019-09-06 DIAGNOSIS — S233XXA Sprain of ligaments of thoracic spine, initial encounter: Secondary | ICD-10-CM | POA: Diagnosis not present

## 2019-09-06 DIAGNOSIS — S338XXA Sprain of other parts of lumbar spine and pelvis, initial encounter: Secondary | ICD-10-CM | POA: Diagnosis not present

## 2019-09-06 DIAGNOSIS — S134XXA Sprain of ligaments of cervical spine, initial encounter: Secondary | ICD-10-CM | POA: Diagnosis not present

## 2019-10-12 ENCOUNTER — Ambulatory Visit: Payer: BC Managed Care – PPO | Attending: Internal Medicine

## 2019-10-12 ENCOUNTER — Other Ambulatory Visit: Payer: Self-pay

## 2019-10-12 DIAGNOSIS — Z20822 Contact with and (suspected) exposure to covid-19: Secondary | ICD-10-CM | POA: Insufficient documentation

## 2019-10-13 ENCOUNTER — Telehealth: Payer: Self-pay | Admitting: *Deleted

## 2019-10-13 LAB — NOVEL CORONAVIRUS, NAA: SARS-CoV-2, NAA: NOT DETECTED

## 2019-10-13 NOTE — Telephone Encounter (Signed)
Pt called to check the status of the covid 19 test result. She is advised that her result is not available at this time.

## 2019-10-25 DIAGNOSIS — E109 Type 1 diabetes mellitus without complications: Secondary | ICD-10-CM | POA: Diagnosis not present

## 2019-10-25 DIAGNOSIS — Z794 Long term (current) use of insulin: Secondary | ICD-10-CM | POA: Diagnosis not present

## 2019-10-31 DIAGNOSIS — S233XXA Sprain of ligaments of thoracic spine, initial encounter: Secondary | ICD-10-CM | POA: Diagnosis not present

## 2019-10-31 DIAGNOSIS — S338XXA Sprain of other parts of lumbar spine and pelvis, initial encounter: Secondary | ICD-10-CM | POA: Diagnosis not present

## 2019-10-31 DIAGNOSIS — S134XXA Sprain of ligaments of cervical spine, initial encounter: Secondary | ICD-10-CM | POA: Diagnosis not present

## 2019-11-08 DIAGNOSIS — Z23 Encounter for immunization: Secondary | ICD-10-CM | POA: Diagnosis not present

## 2019-11-21 ENCOUNTER — Other Ambulatory Visit: Payer: Self-pay | Admitting: Nurse Practitioner

## 2019-11-21 DIAGNOSIS — E109 Type 1 diabetes mellitus without complications: Secondary | ICD-10-CM

## 2019-11-25 DIAGNOSIS — E109 Type 1 diabetes mellitus without complications: Secondary | ICD-10-CM | POA: Diagnosis not present

## 2019-11-25 DIAGNOSIS — Z794 Long term (current) use of insulin: Secondary | ICD-10-CM | POA: Diagnosis not present

## 2019-12-06 DIAGNOSIS — Z23 Encounter for immunization: Secondary | ICD-10-CM | POA: Diagnosis not present

## 2019-12-21 ENCOUNTER — Other Ambulatory Visit: Payer: Self-pay

## 2019-12-21 ENCOUNTER — Encounter: Payer: Self-pay | Admitting: Nurse Practitioner

## 2019-12-21 ENCOUNTER — Ambulatory Visit: Payer: BC Managed Care – PPO | Admitting: Nurse Practitioner

## 2019-12-21 VITALS — BP 115/66 | HR 68 | Temp 98.2°F | Ht 63.0 in | Wt 156.6 lb

## 2019-12-21 DIAGNOSIS — Z0000 Encounter for general adult medical examination without abnormal findings: Secondary | ICD-10-CM

## 2019-12-21 DIAGNOSIS — E109 Type 1 diabetes mellitus without complications: Secondary | ICD-10-CM

## 2019-12-21 DIAGNOSIS — E039 Hypothyroidism, unspecified: Secondary | ICD-10-CM | POA: Diagnosis not present

## 2019-12-21 DIAGNOSIS — E785 Hyperlipidemia, unspecified: Secondary | ICD-10-CM

## 2019-12-21 LAB — BAYER DCA HB A1C WAIVED: HB A1C (BAYER DCA - WAIVED): 7.1 % — ABNORMAL HIGH (ref <7.0)

## 2019-12-21 MED ORDER — SIMVASTATIN 40 MG PO TABS: 40.0000 mg | ORAL_TABLET | Freq: Every evening | ORAL | 1 refills | Status: DC

## 2019-12-21 MED ORDER — INSULIN ASPART 100 UNIT/ML ~~LOC~~ SOLN: SUBCUTANEOUS | 5 refills | Status: DC
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
## 2019-12-21 NOTE — Patient Instructions (Signed)
Diabetes Mellitus and Foot Care Foot care is an important part of your health, especially when you have diabetes. Diabetes may cause you to have problems because of poor blood flow (circulation) to your feet and legs, which can cause your skin to:  Become thinner and drier.  Break more easily.  Heal more slowly.  Peel and crack. You may also have nerve damage (neuropathy) in your legs and feet, causing decreased feeling in them. This means that you may not notice minor injuries to your feet that could lead to more serious problems. Noticing and addressing any potential problems early is the best way to prevent future foot problems. How to care for your feet Foot hygiene  Wash your feet daily with warm water and mild soap. Do not use hot water. Then, pat your feet and the areas between your toes until they are completely dry. Do not soak your feet as this can dry your skin.  Trim your toenails straight across. Do not dig under them or around the cuticle. File the edges of your nails with an emery board or nail file.  Apply a moisturizing lotion or petroleum jelly to the skin on your feet and to dry, brittle toenails. Use lotion that does not contain alcohol and is unscented. Do not apply lotion between your toes. Shoes and socks  Wear clean socks or stockings every day. Make sure they are not too tight. Do not wear knee-high stockings since they may decrease blood flow to your legs.  Wear shoes that fit properly and have enough cushioning. Always look in your shoes before you put them on to be sure there are no objects inside.  To break in new shoes, wear them for just a few hours a day. This prevents injuries on your feet. Wounds, scrapes, corns, and calluses  Check your feet daily for blisters, cuts, bruises, sores, and redness. If you cannot see the bottom of your feet, use a mirror or ask someone for help.  Do not cut corns or calluses or try to remove them with medicine.  If you  find a minor scrape, cut, or break in the skin on your feet, keep it and the skin around it clean and dry. You may clean these areas with mild soap and water. Do not clean the area with peroxide, alcohol, or iodine.  If you have a wound, scrape, corn, or callus on your foot, look at it several times a day to make sure it is healing and not infected. Check for: ? Redness, swelling, or pain. ? Fluid or blood. ? Warmth. ? Pus or a bad smell. General instructions  Do not cross your legs. This may decrease blood flow to your feet.  Do not use heating pads or hot water bottles on your feet. They may burn your skin. If you have lost feeling in your feet or legs, you may not know this is happening until it is too late.  Protect your feet from hot and cold by wearing shoes, such as at the beach or on hot pavement.  Schedule a complete foot exam at least once a year (annually) or more often if you have foot problems. If you have foot problems, report any cuts, sores, or bruises to your health care provider immediately. Contact a health care provider if:  You have a medical condition that increases your risk of infection and you have any cuts, sores, or bruises on your feet.  You have an injury that is not   healing.  You have redness on your legs or feet.  You feel burning or tingling in your legs or feet.  You have pain or cramps in your legs and feet.  Your legs or feet are numb.  Your feet always feel cold.  You have pain around a toenail. Get help right away if:  You have a wound, scrape, corn, or callus on your foot and: ? You have pain, swelling, or redness that gets worse. ? You have fluid or blood coming from the wound, scrape, corn, or callus. ? Your wound, scrape, corn, or callus feels warm to the touch. ? You have pus or a bad smell coming from the wound, scrape, corn, or callus. ? You have a fever. ? You have a red line going up your leg. Summary  Check your feet every day  for cuts, sores, red spots, swelling, and blisters.  Moisturize feet and legs daily.  Wear shoes that fit properly and have enough cushioning.  If you have foot problems, report any cuts, sores, or bruises to your health care provider immediately.  Schedule a complete foot exam at least once a year (annually) or more often if you have foot problems. This information is not intended to replace advice given to you by your health care provider. Make sure you discuss any questions you have with your health care provider. Document Revised: 04/26/2019 Document Reviewed: 09/04/2016 Elsevier Patient Education  2020 [employer]  

## 2019-12-22 ENCOUNTER — Telehealth: Payer: Self-pay | Admitting: Nurse Practitioner

## 2019-12-22 DIAGNOSIS — E039 Hypothyroidism, unspecified: Secondary | ICD-10-CM

## 2019-12-22 LAB — CMP14+EGFR
ALT: 22 IU/L (ref 0–32)
AST: 26 IU/L (ref 0–40)
Albumin/Globulin Ratio: 2.5 — ABNORMAL HIGH (ref 1.2–2.2)
Albumin: 4.7 g/dL (ref 3.8–4.9)
Alkaline Phosphatase: 75 IU/L (ref 39–117)
BUN/Creatinine Ratio: 13 (ref 12–28)
BUN: 10 mg/dL (ref 8–27)
Bilirubin Total: 0.3 mg/dL (ref 0.0–1.2)
CO2: 27 mmol/L (ref 20–29)
Calcium: 10.1 mg/dL (ref 8.7–10.3)
Chloride: 99 mmol/L (ref 96–106)
Creatinine, Ser: 0.8 mg/dL (ref 0.57–1.00)
GFR calc Af Amer: 93 mL/min/{1.73_m2} (ref >59)
GFR calc non Af Amer: 80 mL/min/{1.73_m2} (ref >59)
Globulin, Total: 1.9 g/dL (ref 1.5–4.5)
Glucose: 123 mg/dL — ABNORMAL HIGH (ref 65–99)
Potassium: 4.3 mmol/L (ref 3.5–5.2)
Sodium: 141 mmol/L (ref 134–144)
Total Protein: 6.6 g/dL (ref 6.0–8.5)

## 2019-12-22 LAB — CBC WITH DIFFERENTIAL/PLATELET
Basophils Absolute: 0.1 10*3/uL (ref 0.0–0.2)
Basos: 1 %
EOS (ABSOLUTE): 0.2 10*3/uL (ref 0.0–0.4)
Eos: 3 %
Hematocrit: 41.7 % (ref 34.0–46.6)
Hemoglobin: 14.2 g/dL (ref 11.1–15.9)
Immature Grans (Abs): 0 10*3/uL (ref 0.0–0.1)
Immature Granulocytes: 0 %
Lymphocytes Absolute: 2.3 10*3/uL (ref 0.7–3.1)
Lymphs: 32 %
MCH: 31.3 pg (ref 26.6–33.0)
MCHC: 34.1 g/dL (ref 31.5–35.7)
MCV: 92 fL (ref 79–97)
Monocytes Absolute: 0.5 10*3/uL (ref 0.1–0.9)
Monocytes: 7 %
Neutrophils Absolute: 4 10*3/uL (ref 1.4–7.0)
Neutrophils: 57 %
Platelets: 342 10*3/uL (ref 150–450)
RBC: 4.54 x10E6/uL (ref 3.77–5.28)
RDW: 12.2 % (ref 11.7–15.4)
WBC: 7.1 10*3/uL (ref 3.4–10.8)

## 2019-12-22 LAB — LIPID PANEL
Chol/HDL Ratio: 2.1 ratio (ref 0.0–4.4)
Cholesterol, Total: 200 mg/dL — ABNORMAL HIGH (ref 100–199)
HDL: 97 mg/dL (ref >39)
LDL Chol Calc (NIH): 88 mg/dL (ref 0–99)
Triglycerides: 87 mg/dL (ref 0–149)
VLDL Cholesterol Cal: 15 mg/dL (ref 5–40)

## 2019-12-22 LAB — THYROID PANEL WITH TSH
Free Thyroxine Index: 2.6 (ref 1.2–4.9)
T3 Uptake Ratio: 30 % (ref 24–39)
T4, Total: 8.7 ug/dL (ref 4.5–12.0)
TSH: 1.33 u[IU]/mL (ref 0.450–4.500)

## 2019-12-22 MED ORDER — LEVOTHYROXINE SODIUM 125 MCG PO TABS: 125.0000 ug | ORAL_TABLET | Freq: Every day | ORAL | 1 refills | Status: DC

## 2019-12-22 NOTE — Telephone Encounter (Signed)
Patient needs a refill on levothyroxine .  Please address charges and error of EKG that patient did not have.

## 2019-12-22 NOTE — Telephone Encounter (Signed)
Patient aware that rx was sent to pharmacy. Patient advised that EKG will be removed from chart and our billing department is watching to make sure charge does not drop for the EKG. Safety zone portal was completed by nurse.

## 2019-12-25 DIAGNOSIS — S233XXA Sprain of ligaments of thoracic spine, initial encounter: Secondary | ICD-10-CM | POA: Diagnosis not present

## 2019-12-25 DIAGNOSIS — E109 Type 1 diabetes mellitus without complications: Secondary | ICD-10-CM | POA: Diagnosis not present

## 2019-12-25 DIAGNOSIS — Z794 Long term (current) use of insulin: Secondary | ICD-10-CM | POA: Diagnosis not present

## 2019-12-25 DIAGNOSIS — S338XXA Sprain of other parts of lumbar spine and pelvis, initial encounter: Secondary | ICD-10-CM | POA: Diagnosis not present

## 2019-12-25 DIAGNOSIS — S134XXA Sprain of ligaments of cervical spine, initial encounter: Secondary | ICD-10-CM | POA: Diagnosis not present

## 2019-12-27 DIAGNOSIS — S134XXA Sprain of ligaments of cervical spine, initial encounter: Secondary | ICD-10-CM | POA: Diagnosis not present

## 2019-12-27 DIAGNOSIS — S233XXA Sprain of ligaments of thoracic spine, initial encounter: Secondary | ICD-10-CM | POA: Diagnosis not present

## 2019-12-27 DIAGNOSIS — S338XXA Sprain of other parts of lumbar spine and pelvis, initial encounter: Secondary | ICD-10-CM | POA: Diagnosis not present

## 2019-12-28 NOTE — Progress Notes (Signed)
Order(s) created erroneously. Erroneous order ID: [id number]  Order moved by: Willy Eddy  Order move date/time: 12/28/2019 10:11 AM  Source Patient: [id number]  Source Contact: 12/21/2019  Destination Patient: [id number]  Destination Contact: 06/19/2012

## 2020-01-09 DIAGNOSIS — S233XXA Sprain of ligaments of thoracic spine, initial encounter: Secondary | ICD-10-CM | POA: Diagnosis not present

## 2020-01-09 DIAGNOSIS — S134XXA Sprain of ligaments of cervical spine, initial encounter: Secondary | ICD-10-CM | POA: Diagnosis not present

## 2020-01-09 DIAGNOSIS — S338XXA Sprain of other parts of lumbar spine and pelvis, initial encounter: Secondary | ICD-10-CM | POA: Diagnosis not present

## 2020-01-23 DIAGNOSIS — S338XXA Sprain of other parts of lumbar spine and pelvis, initial encounter: Secondary | ICD-10-CM | POA: Diagnosis not present

## 2020-01-23 DIAGNOSIS — S233XXA Sprain of ligaments of thoracic spine, initial encounter: Secondary | ICD-10-CM | POA: Diagnosis not present

## 2020-01-23 DIAGNOSIS — S134XXA Sprain of ligaments of cervical spine, initial encounter: Secondary | ICD-10-CM | POA: Diagnosis not present

## 2020-01-29 ENCOUNTER — Telehealth: Payer: Self-pay | Admitting: Nurse Practitioner

## 2020-01-29 NOTE — Telephone Encounter (Signed)
Appointment scheduled.

## 2020-01-29 NOTE — Telephone Encounter (Signed)
LMTCB- there are openings tomorrow afternoon.  If patient calls back please schedule appointment

## 2020-01-30 ENCOUNTER — Ambulatory Visit: Payer: BC Managed Care – PPO | Admitting: Nurse Practitioner

## 2020-01-30 ENCOUNTER — Other Ambulatory Visit: Payer: Self-pay

## 2020-01-30 ENCOUNTER — Encounter: Payer: Self-pay | Admitting: Nurse Practitioner

## 2020-01-30 VITALS — BP 147/75 | HR 64 | Temp 98.0°F | Resp 20 | Ht 63.0 in | Wt 155.0 lb

## 2020-01-30 DIAGNOSIS — I889 Nonspecific lymphadenitis, unspecified: Secondary | ICD-10-CM

## 2020-01-30 DIAGNOSIS — M542 Cervicalgia: Secondary | ICD-10-CM

## 2020-01-30 DIAGNOSIS — Z794 Long term (current) use of insulin: Secondary | ICD-10-CM | POA: Diagnosis not present

## 2020-01-30 DIAGNOSIS — E109 Type 1 diabetes mellitus without complications: Secondary | ICD-10-CM | POA: Diagnosis not present

## 2020-01-30 MED ORDER — CYCLOBENZAPRINE HCL 10 MG PO TABS: 10.0000 mg | ORAL_TABLET | Freq: Three times a day (TID) | ORAL | 1 refills | Status: DC | PRN

## 2020-01-30 MED ORDER — PREDNISONE 20 MG PO TABS: ORAL_TABLET | ORAL | 0 refills | Status: DC

## 2020-01-30 MED ORDER — KETOROLAC TROMETHAMINE 60 MG/2ML IM SOLN
60.0000 mg | Freq: Once | INTRAMUSCULAR | Status: AC
  Administered 2020-01-30: 60 mg via INTRAMUSCULAR

## 2020-01-30 NOTE — Progress Notes (Signed)
   Subjective:    Patient ID: Alejandra Mcdonald, female    DOB: [birth year], 60 y.o.   MRN: [id number]   Chief Complaint: Knots behind both ears (Painful)   HPI Patient comes in c/o: - knot behind both ears. They are sore to touch. Have been there in the past but have never been sore before. - neck pain- started 6 months ago.. has been seeing chiropracter and that has not helped any. She has taken some tylenol which does not help. She rates pain 7/10  Currently. Radiates to bil shoulders but is worse on right side. Nothing seems  To make it worse and nothing really helps it, other then repositioning. deniens any numbness or tingling in hands.  Review of Systems  Constitutional: Negative for diaphoresis.  Eyes: Negative for pain.  Respiratory: Negative for shortness of breath.   Cardiovascular: Negative for chest pain, palpitations and leg swelling.  Gastrointestinal: Negative for abdominal pain.  Endocrine: Negative for polydipsia.  Musculoskeletal: Positive for neck pain.  Skin: Negative for rash.  Neurological: Negative for dizziness, weakness and headaches.  Hematological: Does not bruise/bleed easily.  All other systems reviewed and are negative.      Objective:   Physical Exam Vitals and nursing note reviewed.  Constitutional:      Appearance: Normal appearance.  Neck:     Vascular: Carotid bruit: mildly tender.  Cardiovascular:     Rate and Rhythm: Normal rate and regular rhythm.     Heart sounds: Normal heart sounds.  Pulmonary:     Breath sounds: Normal breath sounds.  Musculoskeletal:        General: Normal range of motion.     Comments: Decreased ROM of cervical spine . Only able to mildly tilt head to left and right. paiin with rotation, with limited rotation to the left. Full flexion and extension produce pain. Posterior neck muscles feel tight on palpation. Motor strength and sensation distally intact.  Lymphadenopathy:     Cervical: Cervical adenopathy (mildly  tender) present.  Skin:    General: Skin is warm and dry.  Neurological:     General: No focal deficit present.     Mental Status: She is alert and oriented to person, place, and time.    BP (!) 147/75   Pulse 64   Temp 98 F (36.7 C) (Temporal)   Resp 20   Ht 5\' 3"  (1.6 m)   Wt 155 lb (70.3 kg)   SpO2 98%   BMI 27.46 kg/m         Assessment & Plan:   Modestine L Radde in today with chief complaint of Knots behind both ears (Painful)   1. Cervical lymphadenitis Watch- probably due to virus  2. Cervical pain Moist heat Rest Message RTO prn - predniSONE (DELTASONE) 20 MG tablet; 2 po at sametime daily for 5 days  Dispense: 10 tablet; Refill: 0 - cyclobenzaprine (FLEXERIL) 10 MG tablet; Take 1 tablet (10 mg total) by mouth 3 (three) times daily as needed for muscle spasms.  Dispense: 30 tablet; Refill: 1    The above assessment and management plan was discussed with the patient. The patient verbalized understanding of and has agreed to the management plan. Patient is aware to call the clinic if symptoms persist or worsen. Patient is aware when to return to the clinic for a follow-up visit. Patient educated on when it is appropriate to go to the emergency department.   Mary-Margaret Hassell Done, FNP

## 2020-01-30 NOTE — Patient Instructions (Signed)
Musculoskeletal Pain Musculoskeletal pain refers to aches and pains in your bones, joints, muscles, and the tissues that surround them. This pain can occur in any part of the body. It can last for a short time (acute) or a long time (chronic). A physical exam, lab tests, and imaging studies may be done to find the cause of your musculoskeletal pain. Follow these instructions at home:  Lifestyle  Try to control or lower your stress levels. Stress increases muscle tension and can worsen musculoskeletal pain. It is important to recognize when you are anxious or stressed and learn ways to manage it. This may include: ? Meditation or yoga. ? Cognitive or behavioral therapy. ? Acupuncture or massage therapy.  You may continue all activities unless the activities cause more pain. When the pain gets better, slowly resume your normal activities. Gradually increase the intensity and duration of your activities or exercise. Managing pain, stiffness, and swelling  Take over-the-counter and prescription medicines only as told by your health care provider.  When your pain is severe, bed rest may be helpful. Lie or sit in any position that is comfortable, but get out of bed and walk around at least every couple of hours.  If directed, apply heat to the affected area as often as told by your health care provider. Use the heat source that your health care provider recommends, such as a moist heat pack or a heating pad. ? Place a towel between your skin and the heat source. ? Leave the heat on for 20-30 minutes. ? Remove the heat if your skin turns bright red. This is especially important if you are unable to feel pain, heat, or cold. You may have a greater risk of getting burned.  If directed, put ice on the painful area. ? Put ice in a plastic bag. ? Place a towel between your skin and the bag. ? Leave the ice on for 20 minutes, 2-3 times a day. General instructions  Your health care provider may  recommend that you see a physical therapist. This person can help you come up with a safe exercise program. Do any exercises as told by your physical therapist.  Keep all follow-up visits, including any physical therapy visits, as told by your health care providers. This is important. Contact a health care provider if:  Your pain gets worse.  Medicines do not help ease your pain.  You cannot use the part of your body that hurts, such as your arm, leg, or neck.  You have trouble sleeping.  You have trouble doing your normal activities. Get help right away if:  You have a new injury and your pain is worse or different.  You feel numb or you have tingling in the painful area. Summary  Musculoskeletal pain refers to aches and pains in your bones, joints, muscles, and the tissues that surround them.  This pain can occur in any part of the body.  Your health care provider may recommend that you see a physical therapist. This person can help you come up with a safe exercise program. Do any exercises as told by your physical therapist.  Lower your stress level. Stress can worsen musculoskeletal pain. Ways to lower stress may include meditation, yoga, cognitive or behavioral therapy, acupuncture, and massage therapy. This information is not intended to replace advice given to you by your health care provider. Make sure you discuss any questions you have with your health care provider. Document Revised: 07/16/2017 Document Reviewed: 09/02/2016 Elsevier Patient   Education  2020 [employer]  

## 2020-02-29 DIAGNOSIS — E109 Type 1 diabetes mellitus without complications: Secondary | ICD-10-CM | POA: Diagnosis not present

## 2020-02-29 DIAGNOSIS — Z794 Long term (current) use of insulin: Secondary | ICD-10-CM | POA: Diagnosis not present

## 2020-03-08 DIAGNOSIS — M542 Cervicalgia: Secondary | ICD-10-CM | POA: Diagnosis not present

## 2020-03-22 ENCOUNTER — Ambulatory Visit: Payer: Self-pay | Admitting: Nurse Practitioner

## 2020-03-29 DIAGNOSIS — M4722 Other spondylosis with radiculopathy, cervical region: Secondary | ICD-10-CM | POA: Diagnosis not present

## 2020-03-29 DIAGNOSIS — M542 Cervicalgia: Secondary | ICD-10-CM | POA: Diagnosis not present

## 2020-03-29 DIAGNOSIS — M4802 Spinal stenosis, cervical region: Secondary | ICD-10-CM | POA: Diagnosis not present

## 2020-03-29 DIAGNOSIS — M47812 Spondylosis without myelopathy or radiculopathy, cervical region: Secondary | ICD-10-CM | POA: Diagnosis not present

## 2020-03-29 DIAGNOSIS — M9971 Connective tissue and disc stenosis of intervertebral foramina of cervical region: Secondary | ICD-10-CM | POA: Diagnosis not present

## 2020-03-29 DIAGNOSIS — M5011 Cervical disc disorder with radiculopathy,  high cervical region: Secondary | ICD-10-CM | POA: Diagnosis not present

## 2020-03-29 DIAGNOSIS — M2548 Effusion, other site: Secondary | ICD-10-CM | POA: Diagnosis not present

## 2020-03-31 DIAGNOSIS — E109 Type 1 diabetes mellitus without complications: Secondary | ICD-10-CM | POA: Diagnosis not present

## 2020-03-31 DIAGNOSIS — Z794 Long term (current) use of insulin: Secondary | ICD-10-CM | POA: Diagnosis not present

## 2020-04-01 DIAGNOSIS — G959 Disease of spinal cord, unspecified: Secondary | ICD-10-CM | POA: Diagnosis not present

## 2020-04-02 ENCOUNTER — Other Ambulatory Visit: Payer: Self-pay | Admitting: Orthopedic Surgery

## 2020-04-19 DIAGNOSIS — G959 Disease of spinal cord, unspecified: Secondary | ICD-10-CM | POA: Diagnosis not present

## 2020-04-19 NOTE — Progress Notes (Addendum)
Your procedure is scheduled on Wednesday September 8th.  Report to [facility] Main Entrance "A" at 07:00 A.M., and check in at the Admitting office.  Call this number if you have problems the morning of surgery: [phone]  Call [phone] if you have any questions prior to your surgery date Monday-Friday 8am-4pm   Remember: Do not eat after midnight the night before your surgery  You may drink clear liquids until 07:00 A.M. the morning of your surgery.   Clear liquids allowed are: Water, Non-Citrus Juices (without pulp), Carbonated Beverages, Clear Tea, Black Coffee Only, and Gatorade   Please complete your PRE-SURGERY G2  that was provided to you by 07:00 A.M. the morning of your surgery.   Please, if able, drink it in one setting. DO NOT SIP.  Take these medicines the morning of surgery with A SIP OF WATER: levothyroxine (SYNTHROID)   If needed: acetaminophen (TYLENOL)   Follow your surgeon's instructions on when to stop Aspirin.  If no instructions were given by your surgeon then you will need to call the office to get those instructions.    As of today, STOP taking any Aspirin containing products, Aleve, Naproxen, Ibuprofen, Motrin, Advil, Goody's, BC's, all herbal medications, fish oil, and all vitamins.    WHAT DO I DO ABOUT MY DIABETES MEDICATION?   Marland Kitchen Do not take oral diabetes medicines (pills) the morning of surgery.  . THE DAY/ NIGHT BEFORE SURGERY o insulin aspart (NOVOLOG)  - AM dose - take usual dose - PM dose - do NOT take    . THE MORNING OF SURGERY o insulin aspart (NOVOLOG)  - If your CBG is greater than 220 mg/dL, you may take  of your sliding scale (correction) dose of insulin.   HOW TO MANAGE YOUR DIABETES BEFORE AND AFTER SURGERY  Why is it important to control my blood sugar before and after surgery? . Improving blood sugar levels before and after surgery helps healing and can limit problems. . A way of improving blood sugar control is  eating a healthy diet by: o  Eating less sugar and carbohydrates o  Increasing activity/exercise o  Talking with your doctor about reaching your blood sugar goals . High blood sugars (greater than 180 mg/dL) can raise your risk of infections and slow your recovery, so you will need to focus on controlling your diabetes during the weeks before surgery. . Make sure that the doctor who takes care of your diabetes knows about your planned surgery including the date and location.  How do I manage my blood sugar before surgery? . Check your blood sugar at least 4 times a day, starting 2 days before surgery, to make sure that the level is not too high or low. . Check your blood sugar the morning of your surgery when you wake up and every 2 hours until you get to the Short Stay unit. o If your blood sugar is less than 70 mg/dL, you will need to treat for low blood sugar: - Do not take insulin. - Treat a low blood sugar (less than 70 mg/dL) with  cup of clear juice (cranberry or apple), 4 glucose tablets, OR glucose gel. - Recheck blood sugar in 15 minutes after treatment (to make sure it is greater than 70 mg/dL). If your blood sugar is not greater than 70 mg/dL on recheck, call [phone] for further instructions. . Report your blood sugar to the short stay nurse when you get to Short Stay.  Marland Kitchen  If you are admitted to the hospital after surgery: o Your blood sugar will be checked by the staff and you will probably be given insulin after surgery (instead of oral diabetes medicines) to make sure you have good blood sugar levels. o The goal for blood sugar control after surgery is 80-180 mg/dL.      The Morning of Surgery  Do not wear jewelry, make-up or nail polish.  Do not wear lotions, powders, or perfumes, or deodorant  Do not shave 48 hours prior to surgery.    Do not bring valuables to the hospital.  [facility] is not responsible for any belongings or valuables.  If you are a smoker, DO  NOT Smoke 24 hours prior to surgery  If you wear a CPAP at night please bring your mask the morning of surgery   Remember that you must have someone to transport you home after your surgery, and remain with you for 24 hours if you are discharged the same day.   Please bring cases for contacts, glasses, hearing aids, dentures or bridgework because it cannot be worn into surgery.    Leave your suitcase in the car.  After surgery it may be brought to your room.  For patients admitted to the hospital, discharge time will be determined by your treatment team.  Patients discharged the day of surgery will not be allowed to drive home.    Special instructions:   [place]- Preparing For Surgery  Before surgery, you can play an important role. Because skin is not sterile, your skin needs to be as free of germs as possible. You can reduce the number of germs on your skin by washing with CHG (chlorahexidine gluconate) Soap before surgery.  CHG is an antiseptic cleaner which kills germs and bonds with the skin to continue killing germs even after washing.    Oral Hygiene is also important to reduce your risk of infection.  Remember - BRUSH YOUR TEETH THE MORNING OF SURGERY WITH YOUR REGULAR TOOTHPASTE  Please do not use if you have an allergy to CHG or antibacterial soaps. If your skin becomes reddened/irritated stop using the CHG.  Do not shave (including legs and underarms) for at least 48 hours prior to first CHG shower. It is OK to shave your face.  Please follow these instructions carefully.   1. Shower the NIGHT BEFORE SURGERY and the MORNING OF SURGERY with CHG Soap.   2. If you chose to wash your hair and body, wash as usual with your normal shampoo and body-wash/soap.  3. Rinse your hair and body thoroughly to remove the shampoo and soap.  4. Apply CHG directly to the skin (ONLY FROM THE NECK DOWN) and wash gently with a scrungie or a clean washcloth.   5. Do not use on open  wounds or open sores. Avoid contact with your eyes, ears, mouth and genitals (private parts). Wash Face and genitals (private parts)  with your normal soap.   6. Wash thoroughly, paying special attention to the area where your surgery will be performed.  7. Thoroughly rinse your body with warm water from the neck down.  8. DO NOT shower/wash with your normal soap after using and rinsing off the CHG Soap.  9. Pat yourself dry with a CLEAN TOWEL.  10. Wear CLEAN PAJAMAS to bed the night before surgery  11. Place CLEAN SHEETS on your bed the night of your first shower and DO NOT SLEEP WITH PETS.  12. Wear  comfortable clothes the morning of surgery.     Day of Surgery:  Please shower the morning of surgery with the CHG soap Do not apply any deodorants/lotions. Please wear clean clothes to the hospital/surgery center.   Remember to brush your teeth WITH YOUR REGULAR TOOTHPASTE.   Please read over the following fact sheets that you were given.         \

## 2020-04-23 ENCOUNTER — Encounter (HOSPITAL_COMMUNITY)
Admission: RE | Admit: 2020-04-23 | Discharge: 2020-04-23 | Disposition: A | Discharge status: Home / Self Care | Payer: BC Managed Care – PPO | Source: Ambulatory Visit | Attending: Orthopedic Surgery | Admitting: Orthopedic Surgery

## 2020-04-23 ENCOUNTER — Other Ambulatory Visit (HOSPITAL_COMMUNITY)
Admission: RE | Admit: 2020-04-23 | Discharge: 2020-04-23 | Disposition: A | Discharge status: Home / Self Care | Payer: BC Managed Care – PPO | Source: Ambulatory Visit | Attending: Orthopedic Surgery | Admitting: Orthopedic Surgery

## 2020-04-23 ENCOUNTER — Other Ambulatory Visit: Payer: Self-pay

## 2020-04-23 ENCOUNTER — Encounter (HOSPITAL_COMMUNITY): Payer: Self-pay | Admitting: Orthopedic Surgery

## 2020-04-23 DIAGNOSIS — J45909 Unspecified asthma, uncomplicated: Secondary | ICD-10-CM | POA: Diagnosis not present

## 2020-04-23 DIAGNOSIS — Z01812 Encounter for preprocedural laboratory examination: Secondary | ICD-10-CM | POA: Insufficient documentation

## 2020-04-23 DIAGNOSIS — E039 Hypothyroidism, unspecified: Secondary | ICD-10-CM | POA: Diagnosis not present

## 2020-04-23 DIAGNOSIS — Z20822 Contact with and (suspected) exposure to covid-19: Secondary | ICD-10-CM | POA: Diagnosis not present

## 2020-04-23 DIAGNOSIS — Z01818 Encounter for other preprocedural examination: Secondary | ICD-10-CM | POA: Insufficient documentation

## 2020-04-23 DIAGNOSIS — E119 Type 2 diabetes mellitus without complications: Secondary | ICD-10-CM | POA: Diagnosis not present

## 2020-04-23 DIAGNOSIS — M4802 Spinal stenosis, cervical region: Secondary | ICD-10-CM | POA: Diagnosis not present

## 2020-04-23 DIAGNOSIS — Z79899 Other long term (current) drug therapy: Secondary | ICD-10-CM | POA: Diagnosis not present

## 2020-04-23 LAB — SURGICAL PCR SCREEN
MRSA, PCR: NEGATIVE
Staphylococcus aureus: NEGATIVE

## 2020-04-23 LAB — APTT: aPTT: 28 seconds (ref 24–36)

## 2020-04-23 LAB — CBC WITH DIFFERENTIAL/PLATELET
Abs Immature Granulocytes: 0.02 10*3/uL (ref 0.00–0.07)
Basophils Absolute: 0.1 10*3/uL (ref 0.0–0.1)
Basophils Relative: 1 %
Eosinophils Absolute: 0.2 10*3/uL (ref 0.0–0.5)
Eosinophils Relative: 4 %
HCT: 44.3 % (ref 36.0–46.0)
Hemoglobin: 14.7 g/dL (ref 12.0–15.0)
Immature Granulocytes: 0 %
Lymphocytes Relative: 31 %
Lymphs Abs: 2 10*3/uL (ref 0.7–4.0)
MCH: 32 pg (ref 26.0–34.0)
MCHC: 33.2 g/dL (ref 30.0–36.0)
MCV: 96.5 fL (ref 80.0–100.0)
Monocytes Absolute: 0.5 10*3/uL (ref 0.1–1.0)
Monocytes Relative: 8 %
Neutro Abs: 3.6 10*3/uL (ref 1.7–7.7)
Neutrophils Relative %: 56 %
Platelets: 283 10*3/uL (ref 150–400)
RBC: 4.59 MIL/uL (ref 3.87–5.11)
RDW: 11.8 % (ref 11.5–15.5)
WBC: 6.5 10*3/uL (ref 4.0–10.5)
nRBC: 0 % (ref 0.0–0.2)

## 2020-04-23 LAB — COMPREHENSIVE METABOLIC PANEL
ALT: 30 U/L (ref 0–44)
AST: 30 U/L (ref 15–41)
Albumin: 4.2 g/dL (ref 3.5–5.0)
Alkaline Phosphatase: 59 U/L (ref 38–126)
Anion gap: 7 (ref 5–15)
BUN: 11 mg/dL (ref 6–20)
CO2: 30 mmol/L (ref 22–32)
Calcium: 10 mg/dL (ref 8.9–10.3)
Chloride: 102 mmol/L (ref 98–111)
Creatinine, Ser: 0.77 mg/dL (ref 0.44–1.00)
GFR calc Af Amer: >60 mL/min (ref >60)
GFR calc non Af Amer: >60 mL/min (ref >60)
Glucose, Bld: 167 mg/dL — ABNORMAL HIGH (ref 70–99)
Potassium: 3.8 mmol/L (ref 3.5–5.1)
Sodium: 139 mmol/L (ref 135–145)
Total Bilirubin: 0.7 mg/dL (ref 0.3–1.2)
Total Protein: 6.6 g/dL (ref 6.5–8.1)

## 2020-04-23 LAB — URINALYSIS, ROUTINE W REFLEX MICROSCOPIC
Bilirubin Urine: NEGATIVE
Glucose, UA: NEGATIVE mg/dL
Hgb urine dipstick: NEGATIVE
Ketones, ur: NEGATIVE mg/dL
Leukocytes,Ua: NEGATIVE
Nitrite: NEGATIVE
Protein, ur: NEGATIVE mg/dL
Specific Gravity, Urine: 1.004 — ABNORMAL LOW (ref 1.005–1.030)
pH: 6 (ref 5.0–8.0)

## 2020-04-23 LAB — HEMOGLOBIN A1C
Hgb A1c MFr Bld: 7 % — ABNORMAL HIGH (ref 4.8–5.6)
Mean Plasma Glucose: 154.2 mg/dL

## 2020-04-23 LAB — SARS CORONAVIRUS 2 (TAT 6-24 HRS): SARS Coronavirus 2: NEGATIVE

## 2020-04-23 LAB — TYPE AND SCREEN
ABO/RH(D): A POS
Antibody Screen: NEGATIVE

## 2020-04-23 LAB — PROTIME-INR
INR: 0.9 (ref 0.8–1.2)
Prothrombin Time: 11.8 seconds (ref 11.4–15.2)

## 2020-04-23 LAB — GLUCOSE, CAPILLARY: Glucose-Capillary: 233 mg/dL — ABNORMAL HIGH (ref 70–99)

## 2020-04-23 NOTE — Progress Notes (Addendum)
PCP - Dr. Chevis Pretty  Endocrinologist- none  Cardiologist - no  Chest x-ray - na  EKG - 04/23/20  Stress Test - no  ECHO - no  Cardiac Cath - no  Sleep Study - no CPAP - no  LABS-CBC with Diff, PT, PTT, T/S, UA, A1C, PCR  ASA-  ERAS-yes- clears plus Gatorade until 0530  HA1C- Fasting Blood Sugar - 120's Checks Blood Sugar ___has a libre monitor__ times a day  I instructed patient to reduce Insulin Basal rate by 20% at midnight. I instructed patient to bring Insulin and Pump Supplies. I notified Larene Beach - Diabetic Coordinator of patient scheduled surgery. Anesthesia- CBG was 233 on arrival to PAT-  Mrs Greb reported that she had paused basal rate after having a low CBG this AM  Pt denies having chest pain, sob, or fever at this time. All instructions explained to the pt, with a verbal understanding of the material. Pt agrees to go over the instructions while at home for a better understanding. Pt also instructed to self quarantine after being tested for COVID-19. The opportunity to ask questions was provided.

## 2020-04-23 NOTE — Progress Notes (Signed)
Your procedure is scheduled on Wednesday September 8th.  Report to [facility] Main Entrance "A" at 07:00 A.M., and check in at the Admitting office.         Your surgery or procedure is scheduled to begin at 10:00 AM  Call this number if you have problems the morning of surgery: [phone]  Call [phone] if you have any questions prior to your surgery date Monday-Friday 8am-4pm   Remember: Do not eat after midnight the night before your surgery  You may drink clear liquids until 07:00 A.M. the morning of your surgery.   Clear liquids allowed are: Water, Non-Citrus Juices (without pulp), Carbonated Beverages, Clear Tea, Black Coffee Only, and Gatorade   Please complete your PRE-SURGERY G2  that was provided to you by 07:00 A.M. the morning of your surgery.   Please, if able, drink it in one setting. DO NOT SIP.  Take these medicines the morning of surgery with A SIP OF WATER: levothyroxine (SYNTHROID)   If needed: acetaminophen (TYLENOL)   Follow your surgeon's instructions on when to stop Aspirin.  If no instructions were given by your surgeon then you will need to call the office to get those instructions.    As of today, STOP taking any Aspirin containing products, Aleve, Naproxen, Ibuprofen, Motrin, Advil, Goody's, BC's, all herbal medications, fish oil, and all vitamins.    WHAT DO I DO ABOUT MY DIABETES MEDICATION? Insulin Pump- Contact PCP or Endocrinologist  For instructions  OR Reduce all Basal Rates by 20% at midnight.  Bring Pump Supplies and Insulin to the hospital with you.    HOW TO MANAGE YOUR DIABETES BEFORE AND AFTER SURGERY  Why is it important to control my blood sugar before and after surgery? . Improving blood sugar levels before and after surgery helps healing and can limit problems. . A way of improving blood sugar control is eating a healthy diet by: o  Eating less sugar and carbohydrates o  Increasing activity/exercise o  Talking with  your doctor about reaching your blood sugar goals . High blood sugars (greater than 180 mg/dL) can raise your risk of infections and slow your recovery, so you will need to focus on controlling your diabetes during the weeks before surgery. . Make sure that the doctor who takes care of your diabetes knows about your planned surgery including the date and location.  How do I manage my blood sugar before surgery? . Check your blood sugar at least 4 times a day, starting 2 days before surgery, to make sure that the level is not too high or low. . Check your blood sugar the morning of your surgery when you wake up and every 2 hours until you get to the Short Stay unit. o If your blood sugar is less than 70 mg/dL, you will need to treat for low blood sugar: - Do not take insulin. - Treat a low blood sugar (less than 70 mg/dL) with  cup of clear juice (cranberry or apple), 4 glucose tablets, OR glucose gel. - Recheck blood sugar in 15 minutes after treatment (to make sure it is greater than 70 mg/dL). If your blood sugar is not greater than 70 mg/dL on recheck, call [phone] for further instructions. . Report your blood sugar to the short stay nurse when you get to Short Stay.  . If you are admitted to the hospital after surgery: o Your blood sugar will be checked by the staff and you will probably be  given insulin after surgery (instead of oral diabetes medicines) to make sure you have good blood sugar levels. o The goal for blood sugar control after surgery is 80-180 mg/dL.      The Morning of Surgery  Do not wear jewelry, make-up or nail polish.  Do not wear lotions, powders, or perfumes, or deodorant  Do not shave 48 hours prior to surgery.    Do not bring valuables to the hospital.  [facility] is not responsible for any belongings or valuables.  If you are a smoker, DO NOT Smoke 24 hours prior to surgery  If you wear a CPAP at night please bring your mask the morning of surgery    Remember that you must have someone to transport you home after your surgery, and remain with you for 24 hours if you are discharged the same day.   Please bring cases for contacts, glasses, hearing aids, dentures or bridgework because it cannot be worn into surgery.    Leave your suitcase in the car.  After surgery it may be brought to your room.  For patients admitted to the hospital, discharge time will be determined by your treatment team.  Patients discharged the day of surgery will not be allowed to drive home.    Special instructions:   [place]- Preparing For Surgery  Before surgery, you can play an important role. Because skin is not sterile, your skin needs to be as free of germs as possible. You can reduce the number of germs on your skin by washing with CHG (chlorahexidine gluconate) Soap before surgery.  CHG is an antiseptic cleaner which kills germs and bonds with the skin to continue killing germs even after washing.    Oral Hygiene is also important to reduce your risk of infection.  Remember - BRUSH YOUR TEETH THE MORNING OF SURGERY WITH YOUR REGULAR TOOTHPASTE  Please do not use if you have an allergy to CHG or antibacterial soaps. If your skin becomes reddened/irritated stop using the CHG.  Do not shave (including legs and underarms) for at least 48 hours prior to first CHG shower. It is OK to shave your face.  Please follow these instructions carefully.   1. Shower the NIGHT BEFORE SURGERY and the MORNING OF SURGERY with CHG Soap.   2. If you chose to wash your hair and body, wash as usual with your normal shampoo and body-wash/soap.  Rinse your hair and body thoroughly to remove the shampoo and soap, wash your face and private area with the soap you use at home, then rinse your hair and body thoroughly to remove the shampoo and soap.  3. Apply CHG directly to the skin (ONLY FROM THE NECK DOWN) and wash gently with a scrungie or a clean washcloth.   4. Do  not use on open wounds or open sores. Avoid contact with your eyes, ears, mouth and genitals (private parts).   5. Wash thoroughly, paying special attention to the area where your surgery will be performed.  6. Thoroughly rinse your body with warm water from the neck down.  7. DO NOT shower/wash with your normal soap after using and rinsing off the CHG Soap.  8. Pat yourself dry with a CLEAN TOWEL.  9. Wear CLEAN PAJAMAS to bed the night before surgery  10. Place CLEAN SHEETS on your bed the night of your first shower and DO NOT SLEEP WITH PETS.  11. Wear comfortable clothes the morning of surgery.   Day of  Surgery: Shower as instructed above. Do not wear lotions, powders, or perfumes, or deodorant Please wear clean clothes to the hospital/surgery center.   Remember to brush your teeth WITH YOUR REGULAR TOOTHPASTE.  Do not wear jewelry, make-up or nail polish.  Do not shave 48 hours prior to surgery.    Do not bring valuables to the hospital.  [facility] is not responsible for any belongings or valuables.  If you are a smoker, DO NOT Smoke 24 hours prior to surgery  If you wear a CPAP at night please bring your mask the morning of surgery   Remember that you must have someone to transport you home after your surgery, and remain with you for 24 hours if you are discharged the same day.   Please bring cases for contacts, glasses, hearing aids, dentures or bridgework because it cannot be worn into surgery.    Leave your suitcase in the car.  After surgery it may be brought to your room.  For patients admitted to the hospital, discharge time will be determined by your treatment team.  Patients discharged the day of surgery will not be allowed to drive home.   Please read over the fact sheets that you were given.         \

## 2020-04-24 ENCOUNTER — Observation Stay (HOSPITAL_COMMUNITY)
Admission: RE | Admit: 2020-04-24 | Discharge: 2020-04-25 | Disposition: A | Discharge status: Home / Self Care | Payer: BC Managed Care – PPO | Attending: Orthopedic Surgery | Admitting: Orthopedic Surgery

## 2020-04-24 ENCOUNTER — Ambulatory Visit (HOSPITAL_COMMUNITY): Payer: BC Managed Care – PPO

## 2020-04-24 ENCOUNTER — Ambulatory Visit (HOSPITAL_COMMUNITY): Payer: BC Managed Care – PPO | Admitting: Anesthesiology

## 2020-04-24 ENCOUNTER — Encounter (HOSPITAL_COMMUNITY)
Admission: RE | Disposition: A | Discharge status: Home / Self Care | Payer: Self-pay | Source: Home / Self Care | Attending: Orthopedic Surgery

## 2020-04-24 ENCOUNTER — Encounter (HOSPITAL_COMMUNITY): Payer: Self-pay | Admitting: Orthopedic Surgery

## 2020-04-24 ENCOUNTER — Ambulatory Visit (HOSPITAL_COMMUNITY): Payer: BC Managed Care – PPO | Admitting: Vascular Surgery

## 2020-04-24 ENCOUNTER — Other Ambulatory Visit: Payer: Self-pay

## 2020-04-24 DIAGNOSIS — E039 Hypothyroidism, unspecified: Secondary | ICD-10-CM | POA: Diagnosis not present

## 2020-04-24 DIAGNOSIS — M50123 Cervical disc disorder at C6-C7 level with radiculopathy: Secondary | ICD-10-CM | POA: Diagnosis not present

## 2020-04-24 DIAGNOSIS — J45909 Unspecified asthma, uncomplicated: Secondary | ICD-10-CM | POA: Diagnosis not present

## 2020-04-24 DIAGNOSIS — E119 Type 2 diabetes mellitus without complications: Secondary | ICD-10-CM | POA: Insufficient documentation

## 2020-04-24 DIAGNOSIS — M4802 Spinal stenosis, cervical region: Secondary | ICD-10-CM | POA: Diagnosis not present

## 2020-04-24 DIAGNOSIS — M50122 Cervical disc disorder at C5-C6 level with radiculopathy: Secondary | ICD-10-CM | POA: Diagnosis not present

## 2020-04-24 DIAGNOSIS — Z981 Arthrodesis status: Secondary | ICD-10-CM | POA: Diagnosis not present

## 2020-04-24 DIAGNOSIS — E109 Type 1 diabetes mellitus without complications: Secondary | ICD-10-CM | POA: Diagnosis not present

## 2020-04-24 DIAGNOSIS — M5410 Radiculopathy, site unspecified: Secondary | ICD-10-CM | POA: Diagnosis present

## 2020-04-24 DIAGNOSIS — Z79899 Other long term (current) drug therapy: Secondary | ICD-10-CM | POA: Diagnosis not present

## 2020-04-24 DIAGNOSIS — M4808 Spinal stenosis, sacral and sacrococcygeal region: Secondary | ICD-10-CM | POA: Diagnosis not present

## 2020-04-24 DIAGNOSIS — M4322 Fusion of spine, cervical region: Secondary | ICD-10-CM | POA: Diagnosis not present

## 2020-04-24 DIAGNOSIS — M50121 Cervical disc disorder at C4-C5 level with radiculopathy: Secondary | ICD-10-CM | POA: Diagnosis not present

## 2020-04-24 DIAGNOSIS — Z419 Encounter for procedure for purposes other than remedying health state, unspecified: Secondary | ICD-10-CM

## 2020-04-24 DIAGNOSIS — M5412 Radiculopathy, cervical region: Secondary | ICD-10-CM | POA: Diagnosis not present

## 2020-04-24 HISTORY — PX: ANTERIOR CERVICAL DECOMPRESSION/DISCECTOMY FUSION 4 LEVELS: SHX5556

## 2020-04-24 LAB — GLUCOSE, CAPILLARY
Glucose-Capillary: 169 mg/dL — ABNORMAL HIGH (ref 70–99)
Glucose-Capillary: 142 mg/dL — ABNORMAL HIGH (ref 70–99)
Glucose-Capillary: 134 mg/dL — ABNORMAL HIGH (ref 70–99)
Glucose-Capillary: 124 mg/dL — ABNORMAL HIGH (ref 70–99)
Glucose-Capillary: 204 mg/dL — ABNORMAL HIGH (ref 70–99)

## 2020-04-24 SURGERY — ANTERIOR CERVICAL DECOMPRESSION/DISCECTOMY FUSION 4 LEVELS
Anesthesia: General

## 2020-04-24 MED ORDER — PANTOPRAZOLE SODIUM 40 MG PO TBEC
40.0000 mg | DELAYED_RELEASE_TABLET | Freq: Every day | ORAL | Status: DC
  Administered 2020-04-24: 40 mg via ORAL
  Filled 2020-04-24: qty 1

## 2020-04-24 MED ORDER — BUPIVACAINE HCL 0.25 % IJ SOLN
INTRAMUSCULAR | Status: DC | PRN
  Administered 2020-04-24: 15 mL

## 2020-04-24 MED ORDER — ONDANSETRON HCL 4 MG/2ML IJ SOLN
INTRAMUSCULAR | Status: DC | PRN
  Administered 2020-04-24 (×2): 4 mg via INTRAVENOUS

## 2020-04-24 MED ORDER — CHLORHEXIDINE GLUCONATE 0.12 % MT SOLN
15.0000 mL | Freq: Once | OROMUCOSAL | Status: AC
  Administered 2020-04-24: 15 mL via OROMUCOSAL
  Filled 2020-04-24: qty 15

## 2020-04-24 MED ORDER — SIMVASTATIN 20 MG PO TABS
40.0000 mg | ORAL_TABLET | Freq: Every evening | ORAL | Status: DC
  Administered 2020-04-24: 40 mg via ORAL
  Filled 2020-04-24: qty 2

## 2020-04-24 MED ORDER — LACTATED RINGERS IV SOLN: INTRAVENOUS | Status: DC | PRN

## 2020-04-24 MED ORDER — THROMBIN 20000 UNITS EX KIT
PACK | CUTANEOUS | Status: AC
  Filled 2020-04-24: qty 1

## 2020-04-24 MED ORDER — METOCLOPRAMIDE HCL 5 MG/ML IJ SOLN
INTRAMUSCULAR | Status: DC | PRN
  Administered 2020-04-24: 10 mg via INTRAVENOUS

## 2020-04-24 MED ORDER — FENTANYL CITRATE (PF) 250 MCG/5ML IJ SOLN
INTRAMUSCULAR | Status: DC | PRN
Start: 2020-04-24 — End: 2020-04-24
  Administered 2020-04-24: 50 ug via INTRAVENOUS
  Administered 2020-04-24: 100 ug via INTRAVENOUS
  Administered 2020-04-24 (×2): 50 ug via INTRAVENOUS

## 2020-04-24 MED ORDER — ALUM & MAG HYDROXIDE-SIMETH 200-200-20 MG/5ML PO SUSP: 30.0000 mL | Freq: Four times a day (QID) | ORAL | Status: DC | PRN

## 2020-04-24 MED ORDER — METHOCARBAMOL 1000 MG/10ML IJ SOLN
500.0000 mg | Freq: Four times a day (QID) | INTRAVENOUS | Status: DC | PRN
  Filled 2020-04-24: qty 5

## 2020-04-24 MED ORDER — PROMETHAZINE HCL 25 MG/ML IJ SOLN: 6.2500 mg | INTRAMUSCULAR | Status: DC | PRN

## 2020-04-24 MED ORDER — OXYCODONE HCL 5 MG/5ML PO SOLN: 5.0000 mg | Freq: Once | ORAL | Status: AC | PRN

## 2020-04-24 MED ORDER — MIDAZOLAM HCL 5 MG/5ML IJ SOLN
INTRAMUSCULAR | Status: DC | PRN
  Administered 2020-04-24: 2 mg via INTRAVENOUS

## 2020-04-24 MED ORDER — ACETAMINOPHEN 650 MG RE SUPP: 650.0000 mg | RECTAL | Status: DC | PRN

## 2020-04-24 MED ORDER — ONDANSETRON HCL 4 MG/2ML IJ SOLN
INTRAMUSCULAR | Status: AC
  Filled 2020-04-24: qty 2

## 2020-04-24 MED ORDER — PROMETHAZINE HCL 25 MG/ML IJ SOLN
INTRAMUSCULAR | Status: DC | PRN
  Administered 2020-04-24: 6.25 mg via INTRAVENOUS

## 2020-04-24 MED ORDER — 0.9 % SODIUM CHLORIDE (POUR BTL) OPTIME
TOPICAL | Status: DC | PRN
  Administered 2020-04-24: 1000 mL

## 2020-04-24 MED ORDER — INSULIN PUMP
Freq: Three times a day (TID) | SUBCUTANEOUS | Status: DC
  Administered 2020-04-24: 2.4 via SUBCUTANEOUS
  Administered 2020-04-25: 0.7 via SUBCUTANEOUS
  Filled 2020-04-24: qty 1

## 2020-04-24 MED ORDER — PROPOFOL 10 MG/ML IV BOLUS
INTRAVENOUS | Status: DC | PRN
  Administered 2020-04-24: 150 mg via INTRAVENOUS

## 2020-04-24 MED ORDER — OXYCODONE-ACETAMINOPHEN 5-325 MG PO TABS
1.0000 | ORAL_TABLET | ORAL | Status: DC | PRN
  Administered 2020-04-24 – 2020-04-25 (×4): 2 via ORAL
  Filled 2020-04-24 (×4): qty 2

## 2020-04-24 MED ORDER — ONDANSETRON HCL 4 MG/2ML IJ SOLN
4.0000 mg | Freq: Four times a day (QID) | INTRAMUSCULAR | Status: DC | PRN
  Administered 2020-04-24: 4 mg via INTRAVENOUS
  Filled 2020-04-24: qty 2

## 2020-04-24 MED ORDER — ZOLPIDEM TARTRATE 5 MG PO TABS: 5.0000 mg | ORAL_TABLET | Freq: Every evening | ORAL | Status: DC | PRN

## 2020-04-24 MED ORDER — LIDOCAINE 2% (20 MG/ML) 5 ML SYRINGE
INTRAMUSCULAR | Status: DC | PRN
  Administered 2020-04-24: 60 mg via INTRAVENOUS

## 2020-04-24 MED ORDER — INSULIN PUMP: SUBCUTANEOUS | Status: DC

## 2020-04-24 MED ORDER — ADULT MULTIVITAMIN W/MINERALS CH
1.0000 | ORAL_TABLET | Freq: Every day | ORAL | Status: DC
  Administered 2020-04-24: 1 via ORAL
  Filled 2020-04-24: qty 1

## 2020-04-24 MED ORDER — INSULIN ASPART 100 UNIT/ML ~~LOC~~ SOLN: 100.0000 [IU] | Freq: Every day | SUBCUTANEOUS | Status: DC

## 2020-04-24 MED ORDER — OXYCODONE HCL 5 MG PO TABS
ORAL_TABLET | ORAL | Status: AC
Start: 2020-04-24 — End: 2020-04-25
  Filled 2020-04-24: qty 1

## 2020-04-24 MED ORDER — OXYCODONE HCL 5 MG PO TABS
5.0000 mg | ORAL_TABLET | Freq: Once | ORAL | Status: AC | PRN
  Administered 2020-04-24: 5 mg via ORAL

## 2020-04-24 MED ORDER — ROCURONIUM BROMIDE 10 MG/ML (PF) SYRINGE
PREFILLED_SYRINGE | INTRAVENOUS | Status: AC
  Filled 2020-04-24: qty 10

## 2020-04-24 MED ORDER — EPINEPHRINE PF 1 MG/ML IJ SOLN
INTRAMUSCULAR | Status: DC | PRN
  Administered 2020-04-24: .15 mL

## 2020-04-24 MED ORDER — POVIDONE-IODINE 7.5 % EX SOLN: Freq: Once | CUTANEOUS | Status: DC

## 2020-04-24 MED ORDER — ONDANSETRON 4 MG PO TBDP
4.0000 mg | ORAL_TABLET | Freq: Three times a day (TID) | ORAL | Status: DC | PRN
  Filled 2020-04-24: qty 1

## 2020-04-24 MED ORDER — KETOROLAC TROMETHAMINE 30 MG/ML IJ SOLN
INTRAMUSCULAR | Status: AC
  Filled 2020-04-24: qty 1

## 2020-04-24 MED ORDER — METHOCARBAMOL 500 MG PO TABS
500.0000 mg | ORAL_TABLET | Freq: Four times a day (QID) | ORAL | Status: DC | PRN
  Administered 2020-04-24 – 2020-04-25 (×3): 500 mg via ORAL
  Filled 2020-04-24 (×2): qty 1

## 2020-04-24 MED ORDER — THROMBIN 20000 UNITS EX SOLR
CUTANEOUS | Status: DC | PRN
  Administered 2020-04-24: 20000 [IU] via TOPICAL

## 2020-04-24 MED ORDER — PHENOL 1.4 % MT LIQD: 1.0000 | OROMUCOSAL | Status: DC | PRN

## 2020-04-24 MED ORDER — MIDAZOLAM HCL 2 MG/2ML IJ SOLN
INTRAMUSCULAR | Status: AC
  Filled 2020-04-24: qty 2

## 2020-04-24 MED ORDER — PROPOFOL 10 MG/ML IV BOLUS
INTRAVENOUS | Status: AC
  Filled 2020-04-24: qty 40

## 2020-04-24 MED ORDER — SODIUM CHLORIDE 0.9 % IV SOLN: 250.0000 mL | INTRAVENOUS | Status: DC

## 2020-04-24 MED ORDER — PSYLLIUM 95 % PO PACK
1.0000 | PACK | Freq: Every day | ORAL | Status: DC
  Filled 2020-04-24 (×2): qty 1

## 2020-04-24 MED ORDER — PHENYLEPHRINE 40 MCG/ML (10ML) SYRINGE FOR IV PUSH (FOR BLOOD PRESSURE SUPPORT)
PREFILLED_SYRINGE | INTRAVENOUS | Status: DC | PRN
  Administered 2020-04-24: 80 ug via INTRAVENOUS

## 2020-04-24 MED ORDER — CALCIUM CARBONATE-VITAMIN D 500-200 MG-UNIT PO TABS
2.0000 | ORAL_TABLET | Freq: Every day | ORAL | Status: DC
  Administered 2020-04-24: 2 via ORAL
  Filled 2020-04-24: qty 2

## 2020-04-24 MED ORDER — PROPOFOL 500 MG/50ML IV EMUL
INTRAVENOUS | Status: DC | PRN
  Administered 2020-04-24 (×3): 150 ug/kg/min via INTRAVENOUS

## 2020-04-24 MED ORDER — SODIUM CHLORIDE (PF) 0.9 % IJ SOLN
INTRAMUSCULAR | Status: AC
  Filled 2020-04-24: qty 10

## 2020-04-24 MED ORDER — ORAL CARE MOUTH RINSE: 15.0000 mL | Freq: Once | OROMUCOSAL | Status: AC

## 2020-04-24 MED ORDER — ONDANSETRON HCL 4 MG PO TABS: 4.0000 mg | ORAL_TABLET | Freq: Four times a day (QID) | ORAL | Status: DC | PRN

## 2020-04-24 MED ORDER — SUGAMMADEX SODIUM 200 MG/2ML IV SOLN
INTRAVENOUS | Status: DC | PRN
  Administered 2020-04-24: 200 mg via INTRAVENOUS

## 2020-04-24 MED ORDER — MENTHOL 3 MG MT LOZG
1.0000 | LOZENGE | OROMUCOSAL | Status: DC | PRN
  Filled 2020-04-24: qty 9

## 2020-04-24 MED ORDER — SENNOSIDES-DOCUSATE SODIUM 8.6-50 MG PO TABS: 1.0000 | ORAL_TABLET | Freq: Every evening | ORAL | Status: DC | PRN

## 2020-04-24 MED ORDER — HYDROMORPHONE HCL 1 MG/ML IJ SOLN
INTRAMUSCULAR | Status: AC
Start: 2020-04-24 — End: 2020-04-25
  Filled 2020-04-24: qty 1

## 2020-04-24 MED ORDER — ACETAMINOPHEN 325 MG PO TABS: 650.0000 mg | ORAL_TABLET | ORAL | Status: DC | PRN

## 2020-04-24 MED ORDER — LEVOTHYROXINE SODIUM 25 MCG PO TABS
125.0000 ug | ORAL_TABLET | Freq: Every day | ORAL | Status: DC
  Administered 2020-04-25: 125 ug via ORAL
  Filled 2020-04-24: qty 1

## 2020-04-24 MED ORDER — FENTANYL CITRATE (PF) 250 MCG/5ML IJ SOLN
INTRAMUSCULAR | Status: AC
  Filled 2020-04-24: qty 5

## 2020-04-24 MED ORDER — ACETAMINOPHEN 500 MG PO TABS
1000.0000 mg | ORAL_TABLET | Freq: Once | ORAL | Status: AC
  Administered 2020-04-24: 1000 mg via ORAL
  Filled 2020-04-24: qty 2

## 2020-04-24 MED ORDER — DIPHENHYDRAMINE HCL 50 MG/ML IJ SOLN
INTRAMUSCULAR | Status: AC
  Filled 2020-04-24: qty 1

## 2020-04-24 MED ORDER — CEFAZOLIN SODIUM-DEXTROSE 2-4 GM/100ML-% IV SOLN
2.0000 g | Freq: Three times a day (TID) | INTRAVENOUS | Status: AC
  Administered 2020-04-24 – 2020-04-25 (×2): 2 g via INTRAVENOUS
  Filled 2020-04-24 (×2): qty 100

## 2020-04-24 MED ORDER — SCOPOLAMINE 1 MG/3DAYS TD PT72
1.0000 | MEDICATED_PATCH | TRANSDERMAL | Status: DC
  Administered 2020-04-24: 1.5 mg via TRANSDERMAL
  Filled 2020-04-24: qty 1

## 2020-04-24 MED ORDER — LIDOCAINE 2% (20 MG/ML) 5 ML SYRINGE
INTRAMUSCULAR | Status: AC
  Filled 2020-04-24: qty 5

## 2020-04-24 MED ORDER — PANTOPRAZOLE SODIUM 40 MG IV SOLR: 40.0000 mg | Freq: Every day | INTRAVENOUS | Status: DC

## 2020-04-24 MED ORDER — MEPERIDINE HCL 25 MG/ML IJ SOLN: 6.2500 mg | INTRAMUSCULAR | Status: DC | PRN

## 2020-04-24 MED ORDER — MAGNESIUM HYDROXIDE 400 MG/5ML PO SUSP: 30.0000 mL | ORAL | Status: DC

## 2020-04-24 MED ORDER — DIPHENHYDRAMINE HCL 50 MG/ML IJ SOLN
INTRAMUSCULAR | Status: DC | PRN
  Administered 2020-04-24: 12.5 mg via INTRAVENOUS

## 2020-04-24 MED ORDER — ROCURONIUM BROMIDE 10 MG/ML (PF) SYRINGE
PREFILLED_SYRINGE | INTRAVENOUS | Status: DC | PRN
  Administered 2020-04-24: 50 mg via INTRAVENOUS
  Administered 2020-04-24 (×5): 20 mg via INTRAVENOUS

## 2020-04-24 MED ORDER — SODIUM CHLORIDE 0.9% FLUSH: 3.0000 mL | INTRAVENOUS | Status: DC | PRN

## 2020-04-24 MED ORDER — CEFAZOLIN SODIUM-DEXTROSE 2-4 GM/100ML-% IV SOLN
2.0000 g | INTRAVENOUS | Status: AC
  Administered 2020-04-24: 2 g via INTRAVENOUS
  Filled 2020-04-24: qty 100

## 2020-04-24 MED ORDER — PHENYLEPHRINE 40 MCG/ML (10ML) SYRINGE FOR IV PUSH (FOR BLOOD PRESSURE SUPPORT)
PREFILLED_SYRINGE | INTRAVENOUS | Status: AC
  Filled 2020-04-24: qty 10

## 2020-04-24 MED ORDER — BUPIVACAINE HCL (PF) 0.25 % IJ SOLN
INTRAMUSCULAR | Status: AC
  Filled 2020-04-24: qty 30

## 2020-04-24 MED ORDER — METHOCARBAMOL 500 MG PO TABS
ORAL_TABLET | ORAL | Status: AC
  Filled 2020-04-24: qty 1

## 2020-04-24 MED ORDER — METOCLOPRAMIDE HCL 5 MG/ML IJ SOLN
INTRAMUSCULAR | Status: AC
  Filled 2020-04-24: qty 2

## 2020-04-24 MED ORDER — EPINEPHRINE PF 1 MG/ML IJ SOLN
INTRAMUSCULAR | Status: AC
  Filled 2020-04-24: qty 1

## 2020-04-24 MED ORDER — DOCUSATE SODIUM 100 MG PO CAPS
100.0000 mg | ORAL_CAPSULE | Freq: Two times a day (BID) | ORAL | Status: DC
  Administered 2020-04-24 (×2): 100 mg via ORAL
  Filled 2020-04-24 (×2): qty 1

## 2020-04-24 MED ORDER — KETOROLAC TROMETHAMINE 30 MG/ML IJ SOLN
30.0000 mg | Freq: Once | INTRAMUSCULAR | Status: AC
  Administered 2020-04-24: 30 mg via INTRAVENOUS

## 2020-04-24 MED ORDER — HYDROMORPHONE HCL 1 MG/ML IJ SOLN
0.2500 mg | INTRAMUSCULAR | Status: DC | PRN
  Administered 2020-04-24 (×2): 0.25 mg via INTRAVENOUS

## 2020-04-24 MED ORDER — FLEET ENEMA 7-19 GM/118ML RE ENEM: 1.0000 | ENEMA | Freq: Once | RECTAL | Status: DC | PRN

## 2020-04-24 MED ORDER — DEXAMETHASONE SODIUM PHOSPHATE 10 MG/ML IJ SOLN
INTRAMUSCULAR | Status: DC | PRN
  Administered 2020-04-24: 10 mg via INTRAVENOUS

## 2020-04-24 MED ORDER — PROMETHAZINE HCL 25 MG/ML IJ SOLN
INTRAMUSCULAR | Status: AC
  Filled 2020-04-24: qty 1

## 2020-04-24 MED ORDER — LACTATED RINGERS IV SOLN: INTRAVENOUS | Status: DC

## 2020-04-24 MED ORDER — PHENYLEPHRINE HCL-NACL 10-0.9 MG/250ML-% IV SOLN
INTRAVENOUS | Status: DC | PRN
  Administered 2020-04-24: 25 ug/min via INTRAVENOUS

## 2020-04-24 MED ORDER — SODIUM CHLORIDE 0.9% FLUSH
3.0000 mL | Freq: Two times a day (BID) | INTRAVENOUS | Status: DC
  Administered 2020-04-24: 3 mL via INTRAVENOUS

## 2020-04-24 MED ORDER — DEXAMETHASONE SODIUM PHOSPHATE 10 MG/ML IJ SOLN
INTRAMUSCULAR | Status: AC
  Filled 2020-04-24: qty 1

## 2020-04-24 MED ORDER — BISACODYL 5 MG PO TBEC: 5.0000 mg | DELAYED_RELEASE_TABLET | Freq: Every day | ORAL | Status: DC | PRN

## 2020-04-24 MED ORDER — AMISULPRIDE (ANTIEMETIC) 5 MG/2ML IV SOLN: 10.0000 mg | Freq: Once | INTRAVENOUS | Status: DC | PRN

## 2020-04-24 SURGICAL SUPPLY — 82 items
AGENT HMST KT MTR STRL THRMB (HEMOSTASIS)
APL SKNCLS STERI-STRIP NONHPOA (GAUZE/BANDAGES/DRESSINGS) ×1
BENZOIN TINCTURE PRP APPL 2/3 (GAUZE/BANDAGES/DRESSINGS) ×2 IMPLANT
BIT DRILL NEURO 2X3.1 SFT TUCH (MISCELLANEOUS) ×1 IMPLANT
BIT DRILL SKYLINE 12MM (BIT) IMPLANT
BIT DRILL SRG 14X2.2XFLT CHK (BIT) IMPLANT
BIT DRL SRG 14X2.2XFLT CHK (BIT) ×1
BLADE CLIPPER SURG (BLADE) ×2 IMPLANT
BLADE SURG 15 STRL LF DISP TIS (BLADE) ×1 IMPLANT
BLADE SURG 15 STRL SS (BLADE) ×2
BONE VIVIGEN FORMABLE 1.3CC (Bone Implant) ×4 IMPLANT
BUR MATCHSTICK NEURO 3.0 LAGG (BURR) IMPLANT
CARTRIDGE OIL MAESTRO DRILL (MISCELLANEOUS) ×1 IMPLANT
CLSR STERI-STRIP ANTIMIC 1/2X4 (GAUZE/BANDAGES/DRESSINGS) ×1 IMPLANT
COVER SURGICAL LIGHT HANDLE (MISCELLANEOUS) ×2 IMPLANT
COVER WAND RF STERILE (DRAPES) ×2 IMPLANT
DECANTER SPIKE VIAL GLASS SM (MISCELLANEOUS) ×2 IMPLANT
DIFFUSER DRILL AIR PNEUMATIC (MISCELLANEOUS) ×2 IMPLANT
DRAIN JACKSON RD 7FR 3/32 (WOUND CARE) IMPLANT
DRAPE C-ARM 42X72 X-RAY (DRAPES) ×2 IMPLANT
DRAPE POUCH INSTRU U-SHP 10X18 (DRAPES) ×2 IMPLANT
DRAPE SURG 17X23 STRL (DRAPES) ×6 IMPLANT
DRILL BIT SKYLINE 12MM (BIT) ×2
DRILL BIT SKYLINE 14MM (BIT) ×2
DRILL NEURO 2X3.1 SOFT TOUCH (MISCELLANEOUS) ×2
DURAPREP 26ML APPLICATOR (WOUND CARE) ×2 IMPLANT
ELECT COATED BLADE 2.86 ST (ELECTRODE) ×2 IMPLANT
ELECT REM PT RETURN 9FT ADLT (ELECTROSURGICAL) ×2
ELECTRODE REM PT RTRN 9FT ADLT (ELECTROSURGICAL) ×1 IMPLANT
EVACUATOR SILICONE 100CC (DRAIN) IMPLANT
GAUZE 4X4 16PLY RFD (DISPOSABLE) ×3 IMPLANT
GAUZE SPONGE 4X4 12PLY STRL (GAUZE/BANDAGES/DRESSINGS) ×2 IMPLANT
GAUZE SPONGE 4X4 16PLY XRAY LF (GAUZE/BANDAGES/DRESSINGS) ×1 IMPLANT
GLOVE BIO SURGEON STRL SZ7 (GLOVE) ×2 IMPLANT
GLOVE BIO SURGEON STRL SZ8 (GLOVE) ×2 IMPLANT
GLOVE BIOGEL PI IND STRL 7.0 (GLOVE) ×2 IMPLANT
GLOVE BIOGEL PI IND STRL 8 (GLOVE) ×1 IMPLANT
GLOVE BIOGEL PI INDICATOR 7.0 (GLOVE) ×2
GLOVE BIOGEL PI INDICATOR 8 (GLOVE) ×1
GOWN STRL REUS W/ TWL LRG LVL3 (GOWN DISPOSABLE) ×1 IMPLANT
GOWN STRL REUS W/ TWL XL LVL3 (GOWN DISPOSABLE) ×1 IMPLANT
GOWN STRL REUS W/TWL LRG LVL3 (GOWN DISPOSABLE) ×2
GOWN STRL REUS W/TWL XL LVL3 (GOWN DISPOSABLE) ×2
GRAFT BNE MATRIX VG FRMBL SM 1 (Bone Implant) IMPLANT
INTERLOCK LRDTC CRVCL VBR 6MM (Bone Implant) IMPLANT
INTERLOCK LRDTC CRVCL VBR SM (Bone Implant) IMPLANT
IV CATH 14GX2 1/4 (CATHETERS) ×2 IMPLANT
KIT BASIN OR (CUSTOM PROCEDURE TRAY) ×2 IMPLANT
KIT TURNOVER KIT B (KITS) ×2 IMPLANT
LORDOTIC CERVICAL VBR 6MM SM (Bone Implant) ×2 IMPLANT
LORDOTIC CERVICAL VBR SM 5MM (Bone Implant) ×4 IMPLANT
MANIFOLD NEPTUNE II (INSTRUMENTS) ×2 IMPLANT
NDL PRECISIONGLIDE 27X1.5 (NEEDLE) ×1 IMPLANT
NDL SPNL 20GX3.5 QUINCKE YW (NEEDLE) ×1 IMPLANT
NEEDLE PRECISIONGLIDE 27X1.5 (NEEDLE) ×2 IMPLANT
NEEDLE SPNL 20GX3.5 QUINCKE YW (NEEDLE) ×2 IMPLANT
NS IRRIG 1000ML POUR BTL (IV SOLUTION) ×2 IMPLANT
OIL CARTRIDGE MAESTRO DRILL (MISCELLANEOUS) ×2
PACK ORTHO CERVICAL (CUSTOM PROCEDURE TRAY) ×2 IMPLANT
PAD ARMBOARD 7.5X6 YLW CONV (MISCELLANEOUS) ×4 IMPLANT
PATTIES SURGICAL .5 X.5 (GAUZE/BANDAGES/DRESSINGS) IMPLANT
PATTIES SURGICAL .5 X1 (DISPOSABLE) IMPLANT
PLATE SKYLINE 3LVL 45MM CERV (Plate) ×1 IMPLANT
POSITIONER HEAD DONUT 9IN (MISCELLANEOUS) ×2 IMPLANT
SCREW SKYLINE VAR OS 14MM (Screw) ×3 IMPLANT
SCREW SKYLINE VARIABLE LG (Screw) ×5 IMPLANT
SPONGE INTESTINAL PEANUT (DISPOSABLE) ×5 IMPLANT
SPONGE SURGIFOAM ABS GEL 100 (HEMOSTASIS) ×2 IMPLANT
STRIP CLOSURE SKIN 1/2X4 (GAUZE/BANDAGES/DRESSINGS) ×2 IMPLANT
SURGIFLO W/THROMBIN 8M KIT (HEMOSTASIS) IMPLANT
SUT MNCRL AB 4-0 PS2 18 (SUTURE) ×2 IMPLANT
SUT VIC AB 2-0 CT2 18 VCP726D (SUTURE) ×2 IMPLANT
SYR BULB IRRIG 60ML STRL (SYRINGE) ×2 IMPLANT
SYR CONTROL 10ML LL (SYRINGE) ×4 IMPLANT
TAPE CLOTH 4X10 WHT NS (GAUZE/BANDAGES/DRESSINGS) ×2 IMPLANT
TAPE CLOTH SURG 4X10 WHT LF (GAUZE/BANDAGES/DRESSINGS) ×1 IMPLANT
TAPE UMBILICAL COTTON 1/8X30 (MISCELLANEOUS) ×2 IMPLANT
TOWEL GREEN STERILE (TOWEL DISPOSABLE) ×2 IMPLANT
TOWEL GREEN STERILE FF (TOWEL DISPOSABLE) ×2 IMPLANT
TRAY FOLEY MTR SLVR 16FR STAT (SET/KITS/TRAYS/PACK) ×2 IMPLANT
WATER STERILE IRR 1000ML POUR (IV SOLUTION) ×2 IMPLANT
YANKAUER SUCT BULB TIP NO VENT (SUCTIONS) ×2 IMPLANT

## 2020-04-24 NOTE — Anesthesia Procedure Notes (Signed)
Procedure Name: Intubation Date/Time: 04/24/2020 8:45 AM Performed by: Colin Benton, CRNA Pre-anesthesia Checklist: Patient identified, Emergency Drugs available, Suction available and Patient being monitored Patient Re-evaluated:Patient Re-evaluated prior to induction Oxygen Delivery Method: Circle system utilized Preoxygenation: Pre-oxygenation with 100% oxygen Induction Type: IV induction Ventilation: Mask ventilation without difficulty and Oral airway inserted - appropriate to patient size Laryngoscope Size: Miller Grade View: Grade II Tube type: Oral Tube size: 7.0 mm Number of attempts: 2 Airway Equipment and Method: Stylet Placement Confirmation: ETT inserted through vocal cords under direct vision,  positive ETCO2 and breath sounds checked- equal and bilateral Secured at: 22 cm Tube secured with: Tape Dental Injury: Teeth and Oropharynx as per pre-operative assessment  Comments: DL x 1 with Mil 2.  Grade 2 view.  Unable to pass ETT anterior and ETT hung up on base of cords.  Easy BMV.  DL x 2 with Mil 2.  ETT passed successfully.  EBBS and VSS.

## 2020-04-24 NOTE — H&P (Signed)
PREOPERATIVE H&P  Chief Complaint: Bilateral arm pain  HPI: Alejandra Mcdonald is a 59 y.o. female who presents with ongoing pain in the bilateral arms  MRI reveals spinal stenosis spanning C4-C7  Patient has failed multiple forms of conservative care and continues to have pain (see office notes for additional details regarding the patient's full course of treatment)  Past Medical History:  Diagnosis Date  . Arthritis   . Asthma    as child   no problem in 3 years  . Cataract    bilateral  . Diabetes mellitus without complication (HCC)    Type I   . Hyperlipidemia   . Hypothyroidism   . PONV (postoperative nausea and vomiting)    severe- patch did not help  . Thyroid disease   . Vasomotor phenomenon    Past Surgical History:  Procedure Laterality Date  . APPENDECTOMY    . BACK SURGERY  03/2015  . BILATERAL CARPAL TUNNEL RELEASE    . BIOPSY  03/22/2019   Procedure: BIOPSY;  Surgeon: Rogene Houston, MD;  Location: [facility];  Service: Endoscopy;;  ascending colon polyp biopsy  . CATARACT EXTRACTION Bilateral may & june 2017  . CESAREAN SECTION    . COLONOSCOPY N/A 03/22/2019   Procedure: COLONOSCOPY;  Surgeon: Rogene Houston, MD;  Location: [facility];  Service: Endoscopy;  Laterality: N/A;  930  . POLYPECTOMY  03/22/2019   Procedure: POLYPECTOMY;  Surgeon: Rogene Houston, MD;  Location: [facility];  Service: Endoscopy;;  ascending colon, hepatic flexure  . TUBAL LIGATION     Social History   Socioeconomic History  . Marital status: Married    Spouse name: Not on file  . Number of children: Not on file  . Years of education: Not on file  . Highest education level: Not on file  Occupational History  . Not on file  Tobacco Use  . Smoking status: Never Smoker  . Smokeless tobacco: Never Used  Vaping Use  . Vaping Use: Never used  Substance and Sexual Activity  . Alcohol use: No  . Drug use: No  . Sexual activity: Not on file  Other Topics Concern    . Not on file  Social History Narrative  . Not on file   Social Determinants of Health   Financial Resource Strain:   . Difficulty of Paying Living Expenses: Not on file  Food Insecurity:   . Worried About Charity fundraiser in the Last Year: Not on file  . Ran Out of Food in the Last Year: Not on file  Transportation Needs:   . Lack of Transportation (Medical): Not on file  . Lack of Transportation (Non-Medical): Not on file  Physical Activity:   . Days of Exercise per Week: Not on file  . Minutes of Exercise per Session: Not on file  Stress:   . Feeling of Stress : Not on file  Social Connections:   . Frequency of Communication with Friends and Family: Not on file  . Frequency of Social Gatherings with Friends and Family: Not on file  . Attends Religious Services: Not on file  . Active Member of Clubs or Organizations: Not on file  . Attends Archivist Meetings: Not on file  . Marital Status: Not on file   Family History  Problem Relation Age of Onset  . Hypertension Mother   . Diabetes Mother   . Hypertension Father   . Healthy  Brother   . Healthy Brother   . Healthy Brother    Allergies  Allergen Reactions  . Ace Inhibitors Cough  . Baclofen Other (See Comments)    Raises blood sugar  . Codeine Nausea And Vomiting  . Lipitor [Atorvastatin] Other (See Comments)    Myalgias   Prior to Admission medications   Medication Sig Start Date End Date Taking? Authorizing Provider  acetaminophen (TYLENOL) 500 MG tablet Take 500 mg by mouth at bedtime as needed (for pain.).   Yes [provider]  acetaminophen (TYLENOL) 650 MG CR tablet Take 650 mg by mouth at bedtime as needed for pain.   Yes [provider]  Calcium Citrate-Vitamin D (CALCIUM + D PO) Take 1 tablet by mouth daily.    Yes [provider]  insulin aspart (NOVOLOG) 100 UNIT/ML injection USE UP TO 100 UNITS VIA PUMP PER DAY (Needs to be seen before next refill) 12/21/19  Yes  Hassell Done, Mary-Margaret, FNP  Insulin Human (INSULIN PUMP) SOLN Inject into the skin See admin instructions. Novolog Insulin USE UP TO 100 UNITS VIA PUMP PER DAY   Yes [provider]  levothyroxine (SYNTHROID) 125 MCG tablet Take 1 tablet (125 mcg total) by mouth daily. Patient taking differently: Take 125 mcg by mouth daily before breakfast.  12/22/19  Yes Hassell Done, Mary-Margaret, FNP  magnesium hydroxide (MILK OF MAGNESIA) 400 MG/5ML suspension Take 30 mLs by mouth once a week.    Yes [provider]  Multiple Vitamin (MULTIVITAMIN WITH MINERALS) TABS tablet Take 1 tablet by mouth in the morning and at bedtime.   Yes [provider]  psyllium (METAMUCIL SMOOTH TEXTURE) 58.6 % powder Take 1 packet by mouth daily. Patient taking differently: Take 1 packet by mouth once a week.  03/22/19  Yes Rehman, Mechele Dawley, MD  simvastatin (ZOCOR) 40 MG tablet Take 1 tablet (40 mg total) by mouth every evening. 12/21/19  Yes Hassell Done, Mary-Margaret, FNP  aspirin EC 81 MG tablet Take 81 mg by mouth daily. Swallow whole.    [provider]  Continuous Blood Gluc Receiver (FREESTYLE LIBRE 14 DAY READER) DEVI 1 Device by Does not apply route daily. 09/14/17   Hassell Done Mary-Margaret, FNP  Continuous Blood Gluc Sensor (FREESTYLE LIBRE 14 DAY SENSOR) MISC 1 Device by Does not apply route every 14 (fourteen) days. 09/23/17   Hassell Done Mary-Margaret, FNP  cyclobenzaprine (FLEXERIL) 10 MG tablet Take 1 tablet (10 mg total) by mouth 3 (three) times daily as needed for muscle spasms. Patient not taking: Reported on 04/15/2020 01/30/20   Chevis Pretty, FNP  predniSONE (DELTASONE) 20 MG tablet 2 po at sametime daily for 5 days Patient not taking: Reported on 04/15/2020 01/30/20   Chevis Pretty, FNP     All other systems have been reviewed and were otherwise negative with the exception of those mentioned in the HPI and as above.  Physical Exam: Vitals:   04/24/20 0645  BP: (!) 142/70    Pulse: 62  Resp: 18  Temp: 97.6 F (36.4 C)  SpO2: 100%    Body mass index is 24.6 kg/m.  General: Alert, no acute distress Cardiovascular: No pedal edema Respiratory: No cyanosis, no use of accessory musculature Skin: No lesions in the area of chief complaint Neurologic: Sensation intact distally Psychiatric: Patient is competent for consent with normal mood and affect Lymphatic: No axillary or cervical lymphadenopathy  Assessment/Plan: BILATERAL ARM PAIN, MRI NOTABLE FOR SPINAL CORD COMPRESSION AND NEUROFORAMINAL NARROWING INVOLVING CERVICAL 4-5, CERVICAL 5-6, CERVICAL  6-7 Plan for Procedure(s): ANTERIOR CERVICAL DECOMPRESSION FUSION CERVICAL 4-5, CERVICAL 5-6, CERVICAL 6-7 WITH INSTRUMENTATION AND ALLOGRAFT   Norva Karvonen, MD 04/24/2020 7:39 AM

## 2020-04-24 NOTE — Anesthesia Preprocedure Evaluation (Addendum)
Anesthesia Evaluation  Patient identified by MRN, date of birth, ID band Patient awake    Reviewed: Allergy & Precautions, NPO status , Patient's Chart, lab work & pertinent test results  History of Anesthesia Complications (+) PONV and history of anesthetic complications  Airway Mallampati: I  TM Distance: >3 FB Neck ROM: Full    Dental no notable dental hx. (+) Teeth Intact, Dental Advisory Given   Pulmonary asthma ,    Pulmonary exam normal breath sounds clear to auscultation       Cardiovascular negative cardio ROS Normal cardiovascular exam Rhythm:Regular Rate:Normal     Neuro/Psych Cervical cord compression C4-5, C5-6, C6-7. B/L arm pain negative psych ROS   GI/Hepatic negative GI ROS, Neg liver ROS,   Endo/Other  diabetes, Well Controlled, Type 2, Insulin DependentHypothyroidism a1c 7.0  Renal/GU negative Renal ROS  negative genitourinary   Musculoskeletal  (+) Arthritis , Osteoarthritis,    Abdominal   Peds negative pediatric ROS (+)  Hematology negative hematology ROS (+) hct 44.3, plt 283   Anesthesia Other Findings   Reproductive/Obstetrics negative OB ROS                            Anesthesia Physical Anesthesia Plan  ASA: II  Anesthesia Plan: General   Post-op Pain Management:    Induction: Intravenous  PONV Risk Score and Plan: 4 or greater and Ondansetron, Dexamethasone, Propofol infusion, TIVA, Midazolam, Scopolamine patch - Pre-op, Diphenhydramine, Metaclopromide and Promethazine  Airway Management Planned: Oral ETT  Additional Equipment: None  Intra-op Plan:   Post-operative Plan: Extubation in OR  Informed Consent: I have reviewed the patients History and Physical, chart, labs and discussed the procedure including the risks, benefits and alternatives for the proposed anesthesia with the patient or authorized representative who has indicated his/her  understanding and acceptance.     Dental advisory given  Plan Discussed with: CRNA  Anesthesia Plan Comments: (Has insulin pump at basal rate running, FS this AM 167. Glucometer sensor is on inside of right arm- will check glucose intraop with fingersticks as arms will be tucked and sensor will not be accessible. )       Anesthesia Quick Evaluation

## 2020-04-24 NOTE — Anesthesia Postprocedure Evaluation (Signed)
Anesthesia Post Note  Patient: Alejandra Mcdonald  Procedure(s) Performed: ANTERIOR CERVICAL DECOMPRESSION FUSION CERVICAL 4-5, CERVICAL 5-6, CERVICAL 6-7 WITH INSTRUMENTATION AND ALLOGRAFT (N/A )     Patient location during evaluation: PACU Anesthesia Type: General Level of consciousness: awake and alert, oriented and patient cooperative Pain management: pain level controlled Vital Signs Assessment: post-procedure vital signs reviewed and stable Respiratory status: spontaneous breathing, nonlabored ventilation and respiratory function stable Cardiovascular status: blood pressure returned to baseline and stable Postop Assessment: no apparent nausea or vomiting Anesthetic complications: no   No complications documented.  Last Vitals:  Vitals:   04/24/20 1405 04/24/20 1437  BP: (!) 128/58 (!) 126/59  Pulse: 70 69  Resp: 12 16  Temp:  36.7 C  SpO2: 100% 98%    Last Pain:  Vitals:   04/24/20 1450  TempSrc:   PainSc: 0-No pain                 Pervis Hocking

## 2020-04-24 NOTE — Transfer of Care (Signed)
Immediate Anesthesia Transfer of Care Note  Patient: Alejandra Mcdonald  Procedure(s) Performed: ANTERIOR CERVICAL DECOMPRESSION FUSION CERVICAL 4-5, CERVICAL 5-6, CERVICAL 6-7 WITH INSTRUMENTATION AND ALLOGRAFT (N/A )  Patient Location: PACU  Anesthesia Type:General  Level of Consciousness: drowsy  Airway & Oxygen Therapy: Patient Spontanous Breathing and Patient connected to nasal cannula oxygen  Post-op Assessment: Report given to RN and Post -op Vital signs reviewed and stable  Post vital signs: Reviewed and stable  Last Vitals:  Vitals Value Taken Time  BP 152/69 04/24/20 1222  Temp    Pulse 81 04/24/20 1223  Resp 17 04/24/20 1223  SpO2 100 % 04/24/20 1223  Vitals shown include unvalidated device data.  Last Pain:  Vitals:   04/24/20 0653  TempSrc:   PainSc: 6       Patients Stated Pain Goal: 3 (15/40/08 6761)  Complications: No complications documented.

## 2020-04-24 NOTE — Op Note (Signed)
PATIENT NAME: Alejandra Mcdonald   MEDICAL RECORD NO.:   [id number]    DATE OF BIRTH: 1961-02-29   DATE OF PROCEDURE: 03/2020                               OPERATIVE REPORT     PREOPERATIVE DIAGNOSES: 1. Bilateral cervical radiculopathy. 2. Spinal stenosis spanning C4-C7.   POSTOPERATIVE DIAGNOSES: 1. Bilateral cervical radiculopathy. 2. Spinal stenosis spanning C4-C7.   PROCEDURE: 1. Anterior cervical decompression and fusion C4/5, C5/6, C6/7. 2. Placement of anterior instrumentation, C4-C7. 3. Insertion of interbody device x 3 (Titan intervertebral spacers). 4. Intraoperative use of fluoroscopy. 5. Use of morselized allograft - ViviGen.   SURGEON:  Phylliss Bob, MD   ASSISTANT:  Pricilla Holm, PA-C.   ANESTHESIA:  General endotracheal anesthesia.   COMPLICATIONS:  None.   DISPOSITION:  Stable.   ESTIMATED BLOOD LOSS:  Minimal.   INDICATIONS FOR SURGERY:  Briefly, Alejandra Mcdonald is a pleasant 59 y.o. year- old female, who did present to me with severe pain in the neck and bilateral arms.  The patient's MRI did reveal the findings noted above.  Given the patient's ongoing rather debilitating pain and lack of improvement with appropriate treatment measures, we did discuss proceeding with the procedure noted above.  The patient was fully aware of the risks and limitations of surgery as outlined in my preoperative note.   OPERATIVE DETAILS:  On 04/24/2020, the patient was brought to surgery and general endotracheal anesthesia was administered.  The patient was placed supine on the hospital bed. The neck was gently extended.  All bony prominences were meticulously padded.  The neck was prepped and draped in the usual sterile fashion.  At this point, I did make a left-sided transverse incision.  The platysma was incised.  A Smith-Robinson approach was used and the anterior spine was identified. A self-retaining retractor was placed.  I then subperiosteally exposed the vertebral  bodies from C4-C7.  Caspar pins were then placed into the C6 and C7 vertebral bodies and distraction was applied.  A thorough and complete C6-7 intervertebral diskectomy was performed.    Of note, this was an extremely collapsed disc space.  The posterior longitudinal ligament was identified and entered using a nerve hook.  I then used #1 followed by #2 Kerrison to perform a thorough and complete intervertebral diskectomy.  The spinal canal was thoroughly decompressed, as was the right and left neuroforamen.  The endplates were then prepared and the appropriate-sized intervertebral spacer was then packed with ViviGen and tamped into position in the usual fashion.  The lower Caspar pin was then removed and placed into the C5 vertebral body and once again, distraction was applied across the C5-6 intervertebral space.  I then again performed a thorough and complete diskectomy, thoroughly decompressing the spinal canal and bilateral neuroforamena.   As noted at C6-7, this disc space was also extremely collapsed.  After preparing the endplates, the appropriate-sized intervertebral spacer was packed with ViviGen and tamped into position.  The lower Caspar pin was then removed and placed into the C4 vertebral body and once again, distraction was applied across the C4-5 intervertebral space.  I then again performed a thorough and complete diskectomy, thoroughly decompressing the spinal canal and bilateral neuroforamena.  After preparing the endplates, the appropriate-sized intervertebral spacer was packed with ViviGen and tamped into position.  The Caspar pins then were removed and bone wax was placed  in their place.  The appropriate-sized anterior cervical plate was placed over the anterior spine.    A combination of 14 mm and 12 mm variable angle screws were placed, 2 in each vertebral body from C4-C7 for a total of 8 vertebral body screws, with the exception of C5, where the right vertebral body  screw was noted to have very suboptimal purchase.  The left screw had excellent purchase.  I therefore did elect to not place a screw on the right side at C5.  Instead, bone wax was placed in the hole that was previously drilled. The screws were then locked to the plate using the Cam locking mechanism.  I was very pleased with the final fluoroscopic images.  The wound was then irrigated.  The wound was then explored for any undue bleeding and there was no bleeding noted. The wound was then closed in layers using 2-0 Vicryl, followed by 4-0 Monocryl.  Benzoin and Steri-Strips were applied, followed by sterile dressing.  All instrument counts were correct at the termination of the procedure.   Of note, Pricilla Holm, PA-C, was my assistant throughout surgery, and did aid in retraction, suctioning, placement of the hardware, and closure from start to finish.       Phylliss Bob, MD

## 2020-04-24 NOTE — Progress Notes (Signed)
Inpatient Diabetes Program Recommendations  AACE/ADA: New Consensus Statement on Inpatient Glycemic Control (2015)  Target Ranges:  Prepandial:   less than 140 mg/dL      Peak postprandial:   less than 180 mg/dL (1-2 hours)      Critically ill patients:  140 - 180 mg/dL   Lab Results  Component Value Date   GLUCAP 134 (H) 04/24/2020   HGBA1C 7.0 (H) 04/23/2020    Review of Glycemic Control  Diabetes history: DM type 1 Outpatient Diabetes medications: medtronic insulin pump Basal  25.725 units in 24 hour period  Carb ratio 1 unit for every 15 grams of carbs  Sensitivity 1 units drops glucose 50 points  Target glucose 100  Active insulin time 2 hours  Current orders for Inpatient glycemic control: insulin pump  Inpatient Diabetes Program Recommendations:    Placed order for insulin pump order set per previous insulin pump order. Spoke with pt at bedside to review insulin pump settings. Explained to pt and mom at bedside that she received 10 mg Decadron during surgery ad her glucose trends may increase for about 24-48 hours.  Thanks,  Tama Headings RN, MSN, BC-ADM Inpatient Diabetes Coordinator Team Pager [phone] (8a-5p)

## 2020-04-25 DIAGNOSIS — E039 Hypothyroidism, unspecified: Secondary | ICD-10-CM | POA: Diagnosis not present

## 2020-04-25 DIAGNOSIS — M4802 Spinal stenosis, cervical region: Secondary | ICD-10-CM | POA: Diagnosis not present

## 2020-04-25 DIAGNOSIS — Z79899 Other long term (current) drug therapy: Secondary | ICD-10-CM | POA: Diagnosis not present

## 2020-04-25 DIAGNOSIS — J45909 Unspecified asthma, uncomplicated: Secondary | ICD-10-CM | POA: Diagnosis not present

## 2020-04-25 DIAGNOSIS — E119 Type 2 diabetes mellitus without complications: Secondary | ICD-10-CM | POA: Diagnosis not present

## 2020-04-25 LAB — GLUCOSE, CAPILLARY
Glucose-Capillary: 166 mg/dL — ABNORMAL HIGH (ref 70–99)
Glucose-Capillary: 120 mg/dL — ABNORMAL HIGH (ref 70–99)

## 2020-04-25 MED ORDER — HYDROCODONE-ACETAMINOPHEN 5-325 MG PO TABS: 1.0000 | ORAL_TABLET | ORAL | 0 refills | Status: DC | PRN

## 2020-04-25 MED ORDER — METHOCARBAMOL 500 MG PO TABS: 500.0000 mg | ORAL_TABLET | Freq: Four times a day (QID) | ORAL | 1 refills | Status: DC | PRN

## 2020-04-25 NOTE — Discharge Summary (Signed)
Patient ID: Alejandra Mcdonald MRN: [id number] DOB/AGE: 1960-09-10 59 y.o.  Admit date: 04/24/2020 Discharge date: 04/25/2020  Admission Diagnoses:  Active Problems:   Radiculopathy   Discharge Diagnoses:  Same  Past Medical History:  Diagnosis Date  . Arthritis   . Asthma    as child   no problem in 3 years  . Cataract    bilateral  . Diabetes mellitus without complication (HCC)    Type I   . Hyperlipidemia   . Hypothyroidism   . PONV (postoperative nausea and vomiting)    severe- patch did not help  . Thyroid disease   . Vasomotor phenomenon     Surgeries: Procedure(s): ANTERIOR CERVICAL DECOMPRESSION FUSION CERVICAL 4-5, CERVICAL 5-6, CERVICAL 6-7 WITH INSTRUMENTATION AND ALLOGRAFT on 04/24/2020   Consultants: none  Discharged Condition: Improved  Hospital Course: Alejandra Mcdonald is an 59 y.o. female who was admitted 04/24/2020 for operative treatment of radiculopathy. Patient has severe unremitting pain that affects sleep, daily activities, and work/hobbies. After pre-op clearance the patient was taken to the operating room on 04/24/2020 and underwent  Procedure(s): ANTERIOR CERVICAL DECOMPRESSION FUSION CERVICAL 4-5, CERVICAL 5-6, CERVICAL 6-7 WITH INSTRUMENTATION AND ALLOGRAFT.    Patient was given perioperative antibiotics:  Anti-infectives (From admission, onward)   Start     Dose/Rate Route Frequency Ordered Stop   04/24/20 1700  ceFAZolin (ANCEF) IVPB 2g/100 mL premix        2 g 200 mL/hr over 30 Minutes Intravenous Every 8 hours 04/24/20 1428 04/25/20 0200   04/24/20 0645  ceFAZolin (ANCEF) IVPB 2g/100 mL premix        2 g 200 mL/hr over 30 Minutes Intravenous On call to O.R. 04/24/20 1287 04/24/20 8676       Patient was given sequential compression devices, early ambulation to prevent DVT.  Patient benefited maximally from hospital stay and there were no complications.    Recent vital signs:  Patient Vitals for the past 24 hrs:  BP Temp Temp src Pulse Resp  SpO2  04/25/20 0807 136/63 98.1 F (36.7 C) Oral 62 16 94 %  04/25/20 0420 137/72 98.2 F (36.8 C) Oral (!) 54 18 96 %  04/24/20 2313 140/60 98.4 F (36.9 C) Oral 64 18 98 %  04/24/20 1940 130/72 98.3 F (36.8 C) Oral 63 18 96 %  04/24/20 1437 (!) 126/59 98 F (36.7 C) Oral 69 16 98 %  04/24/20 1405 (!) 128/58 -- -- 70 12 100 %  04/24/20 1350 134/67 99 F (37.2 C) -- 72 16 100 %  04/24/20 1336 131/62 -- -- 69 13 100 %  04/24/20 1321 136/67 -- -- 73 12 100 %  04/24/20 1306 (!) 146/67 -- -- 72 13 100 %  04/24/20 1251 (!) 144/67 -- -- 69 12 100 %  04/24/20 1236 (!) 153/69 -- -- 73 15 97 %  04/24/20 1230 -- -- -- 77 15 96 %  04/24/20 1222 (!) 152/69 99 F (37.2 C) -- 81 13 99 %     Discharge Medications:   Allergies as of 04/25/2020      Reactions   Ace Inhibitors Cough   Baclofen Other (See Comments)   Raises blood sugar   Codeine Nausea And Vomiting   Lipitor [atorvastatin] Other (See Comments)   Myalgias      Medication List    TAKE these medications   aspirin EC 81 MG tablet Take 81 mg by mouth daily. Swallow whole.   CALCIUM +  D PO Take 1 tablet by mouth daily.   FreeStyle Libre 14 Day Reader Kerrin Mo 1 Device by Does not apply route daily.   FreeStyle Libre 14 Day Sensor Misc 1 Device by Does not apply route every 14 (fourteen) days.   HYDROcodone-acetaminophen 5-325 MG tablet Commonly known as: NORCO/VICODIN Take 1-2 tablets by mouth every 4 (four) hours as needed for moderate pain.   insulin aspart 100 UNIT/ML injection Commonly known as: NovoLOG USE UP TO 100 UNITS VIA PUMP PER DAY (Needs to be seen before next refill)   insulin pump Soln Inject into the skin See admin instructions. Novolog Insulin USE UP TO 100 UNITS VIA PUMP PER DAY   levothyroxine 125 MCG tablet Commonly known as: SYNTHROID Take 1 tablet (125 mcg total) by mouth daily. What changed: when to take this   magnesium hydroxide 400 MG/5ML suspension Commonly known as: MILK OF  MAGNESIA Take 30 mLs by mouth once a week.   Metamucil Smooth Texture 58.6 % powder Generic drug: psyllium Take 1 packet by mouth daily. What changed: when to take this   methocarbamol 500 MG tablet Commonly known as: ROBAXIN Take 1 tablet (500 mg total) by mouth every 6 (six) hours as needed for muscle spasms.   multivitamin with minerals Tabs tablet Take 1 tablet by mouth in the morning and at bedtime.   simvastatin 40 MG tablet Commonly known as: ZOCOR Take 1 tablet (40 mg total) by mouth every evening.       Diagnostic Studies: DG Cervical Spine 2-3 Views  Result Date: 04/24/2020 CLINICAL DATA:  Intraoperative imaging for cervical fusion. EXAM: CERVICAL SPINE - 2-3 VIEW; DG C-ARM 1-60 MIN COMPARISON:  MRI cervical spine 03/29/2020 FINDINGS: Fluoroscopic intraoperative spot views of the cervical spine demonstrate anterior plate and screws and interbody spacers in place across 3 disc interspaces. Levels of fusion or indeterminate from images provided. No acute abnormality is seen. IMPRESSION: Intraoperative imaging for cervical discectomy and fusion across 3 levels. Levels of fusion are indeterminate from images provided. Electronically Signed   By: Inge Rise M.D.   On: 04/24/2020 12:17   DG C-Arm 1-60 Min  Result Date: 04/24/2020 CLINICAL DATA:  Intraoperative imaging for cervical fusion. EXAM: CERVICAL SPINE - 2-3 VIEW; DG C-ARM 1-60 MIN COMPARISON:  MRI cervical spine 03/29/2020 FINDINGS: Fluoroscopic intraoperative spot views of the cervical spine demonstrate anterior plate and screws and interbody spacers in place across 3 disc interspaces. Levels of fusion or indeterminate from images provided. No acute abnormality is seen. IMPRESSION: Intraoperative imaging for cervical discectomy and fusion across 3 levels. Levels of fusion are indeterminate from images provided. Electronically Signed   By: Inge Rise M.D.   On: 04/24/2020 12:17    Disposition: Discharge  disposition: 01-Home or Self Care        POD #1 s/p ACDF, doing well  - encourage ambulation - Percocet for pain, Robaxin for muscle spasms -Scripts for pain sent to pharmacy electronically  -D/C instructions sheet printed and in chart -D/C today  -F/U in office 2 weeks   Signed: Lennie Muckle Jaaliyah Lucatero 04/25/2020, 11:55 AM

## 2020-04-25 NOTE — Progress Notes (Signed)
    Patient doing well  Denies arm pain Tolerating PO well   Physical Exam: Vitals:   04/24/20 2313 04/25/20 0420  BP: 140/60 137/72  Pulse: 64 (!) 54  Resp: 18 18  Temp: 98.4 F (36.9 C) 98.2 F (36.8 C)  SpO2: 98% 96%    Neck soft/supple Dressing in place NVI  POD #1 s/p ACDF, doing well  - encourage ambulation - Percocet for pain, Robaxin for muscle spasms - d/c home today with f/u in 2 weeks

## 2020-04-25 NOTE — Plan of Care (Signed)
Pt doing well. Pt given D/C instructions with verbal understanding. Rx's were sent to the pharmacy by MD. Pt's incision is clean and dry with no sign of infection. Pt's IV was removed prior to D/C. Pt D/C'd home via wheelchair per MD order. Pt is stable @ D/C and has no other needs at this time. Sumi Lye, RN  

## 2020-04-26 ENCOUNTER — Encounter (HOSPITAL_COMMUNITY): Payer: Self-pay | Admitting: Orthopedic Surgery

## 2020-05-06 DIAGNOSIS — E109 Type 1 diabetes mellitus without complications: Secondary | ICD-10-CM | POA: Diagnosis not present

## 2020-05-06 DIAGNOSIS — Z794 Long term (current) use of insulin: Secondary | ICD-10-CM | POA: Diagnosis not present

## 2020-05-08 DIAGNOSIS — Z9889 Other specified postprocedural states: Secondary | ICD-10-CM | POA: Diagnosis not present

## 2020-05-17 DIAGNOSIS — Z981 Arthrodesis status: Secondary | ICD-10-CM | POA: Diagnosis not present

## 2020-05-17 DIAGNOSIS — M4322 Fusion of spine, cervical region: Secondary | ICD-10-CM | POA: Diagnosis not present

## 2020-06-03 LAB — HM DIABETES EYE EXAM

## 2020-06-05 DIAGNOSIS — E109 Type 1 diabetes mellitus without complications: Secondary | ICD-10-CM | POA: Diagnosis not present

## 2020-06-05 DIAGNOSIS — Z794 Long term (current) use of insulin: Secondary | ICD-10-CM | POA: Diagnosis not present

## 2020-06-07 DIAGNOSIS — Z9889 Other specified postprocedural states: Secondary | ICD-10-CM | POA: Diagnosis not present

## 2020-06-17 DIAGNOSIS — M542 Cervicalgia: Secondary | ICD-10-CM | POA: Diagnosis not present

## 2020-06-17 DIAGNOSIS — M799 Soft tissue disorder, unspecified: Secondary | ICD-10-CM | POA: Diagnosis not present

## 2020-06-17 DIAGNOSIS — R293 Abnormal posture: Secondary | ICD-10-CM | POA: Diagnosis not present

## 2020-06-17 DIAGNOSIS — M2569 Stiffness of other specified joint, not elsewhere classified: Secondary | ICD-10-CM | POA: Diagnosis not present

## 2020-06-18 DIAGNOSIS — M542 Cervicalgia: Secondary | ICD-10-CM | POA: Diagnosis not present

## 2020-06-18 DIAGNOSIS — M799 Soft tissue disorder, unspecified: Secondary | ICD-10-CM | POA: Diagnosis not present

## 2020-06-18 DIAGNOSIS — M2569 Stiffness of other specified joint, not elsewhere classified: Secondary | ICD-10-CM | POA: Diagnosis not present

## 2020-06-18 DIAGNOSIS — R293 Abnormal posture: Secondary | ICD-10-CM | POA: Diagnosis not present

## 2020-06-20 DIAGNOSIS — R293 Abnormal posture: Secondary | ICD-10-CM | POA: Diagnosis not present

## 2020-06-20 DIAGNOSIS — M542 Cervicalgia: Secondary | ICD-10-CM | POA: Diagnosis not present

## 2020-06-20 DIAGNOSIS — M799 Soft tissue disorder, unspecified: Secondary | ICD-10-CM | POA: Diagnosis not present

## 2020-06-20 DIAGNOSIS — M2569 Stiffness of other specified joint, not elsewhere classified: Secondary | ICD-10-CM | POA: Diagnosis not present

## 2020-06-24 DIAGNOSIS — M542 Cervicalgia: Secondary | ICD-10-CM | POA: Diagnosis not present

## 2020-06-24 DIAGNOSIS — M799 Soft tissue disorder, unspecified: Secondary | ICD-10-CM | POA: Diagnosis not present

## 2020-06-24 DIAGNOSIS — R293 Abnormal posture: Secondary | ICD-10-CM | POA: Diagnosis not present

## 2020-06-24 DIAGNOSIS — M2569 Stiffness of other specified joint, not elsewhere classified: Secondary | ICD-10-CM | POA: Diagnosis not present

## 2020-06-25 DIAGNOSIS — M799 Soft tissue disorder, unspecified: Secondary | ICD-10-CM | POA: Diagnosis not present

## 2020-06-25 DIAGNOSIS — M2569 Stiffness of other specified joint, not elsewhere classified: Secondary | ICD-10-CM | POA: Diagnosis not present

## 2020-06-25 DIAGNOSIS — M542 Cervicalgia: Secondary | ICD-10-CM | POA: Diagnosis not present

## 2020-06-25 DIAGNOSIS — R293 Abnormal posture: Secondary | ICD-10-CM | POA: Diagnosis not present

## 2020-07-02 DIAGNOSIS — M2569 Stiffness of other specified joint, not elsewhere classified: Secondary | ICD-10-CM | POA: Diagnosis not present

## 2020-07-02 DIAGNOSIS — M799 Soft tissue disorder, unspecified: Secondary | ICD-10-CM | POA: Diagnosis not present

## 2020-07-02 DIAGNOSIS — M542 Cervicalgia: Secondary | ICD-10-CM | POA: Diagnosis not present

## 2020-07-02 DIAGNOSIS — R293 Abnormal posture: Secondary | ICD-10-CM | POA: Diagnosis not present

## 2020-07-04 DIAGNOSIS — M542 Cervicalgia: Secondary | ICD-10-CM | POA: Diagnosis not present

## 2020-07-04 DIAGNOSIS — M799 Soft tissue disorder, unspecified: Secondary | ICD-10-CM | POA: Diagnosis not present

## 2020-07-04 DIAGNOSIS — M2569 Stiffness of other specified joint, not elsewhere classified: Secondary | ICD-10-CM | POA: Diagnosis not present

## 2020-07-04 DIAGNOSIS — R293 Abnormal posture: Secondary | ICD-10-CM | POA: Diagnosis not present

## 2020-07-06 DIAGNOSIS — Z794 Long term (current) use of insulin: Secondary | ICD-10-CM | POA: Diagnosis not present

## 2020-07-06 DIAGNOSIS — E109 Type 1 diabetes mellitus without complications: Secondary | ICD-10-CM | POA: Diagnosis not present

## 2020-07-08 DIAGNOSIS — E109 Type 1 diabetes mellitus without complications: Secondary | ICD-10-CM | POA: Diagnosis not present

## 2020-07-19 DIAGNOSIS — Z9889 Other specified postprocedural states: Secondary | ICD-10-CM | POA: Diagnosis not present

## 2020-07-23 DIAGNOSIS — E785 Hyperlipidemia, unspecified: Secondary | ICD-10-CM | POA: Diagnosis not present

## 2020-07-23 DIAGNOSIS — S63502A Unspecified sprain of left wrist, initial encounter: Secondary | ICD-10-CM | POA: Diagnosis not present

## 2020-07-23 DIAGNOSIS — M79604 Pain in right leg: Secondary | ICD-10-CM | POA: Diagnosis not present

## 2020-07-23 DIAGNOSIS — S76311A Strain of muscle, fascia and tendon of the posterior muscle group at thigh level, right thigh, initial encounter: Secondary | ICD-10-CM | POA: Diagnosis not present

## 2020-07-23 DIAGNOSIS — M791 Myalgia, unspecified site: Secondary | ICD-10-CM | POA: Diagnosis not present

## 2020-07-23 DIAGNOSIS — M25571 Pain in right ankle and joints of right foot: Secondary | ICD-10-CM | POA: Diagnosis not present

## 2020-07-23 DIAGNOSIS — S93401A Sprain of unspecified ligament of right ankle, initial encounter: Secondary | ICD-10-CM | POA: Diagnosis not present

## 2020-07-23 DIAGNOSIS — S6392XA Sprain of unspecified part of left wrist and hand, initial encounter: Secondary | ICD-10-CM | POA: Diagnosis not present

## 2020-07-23 DIAGNOSIS — W010XXA Fall on same level from slipping, tripping and stumbling without subsequent striking against object, initial encounter: Secondary | ICD-10-CM | POA: Diagnosis not present

## 2020-07-23 DIAGNOSIS — E039 Hypothyroidism, unspecified: Secondary | ICD-10-CM | POA: Diagnosis not present

## 2020-07-23 DIAGNOSIS — E109 Type 1 diabetes mellitus without complications: Secondary | ICD-10-CM | POA: Diagnosis not present

## 2020-07-23 DIAGNOSIS — M25532 Pain in left wrist: Secondary | ICD-10-CM | POA: Diagnosis not present

## 2020-07-31 ENCOUNTER — Other Ambulatory Visit: Payer: Self-pay | Admitting: Nurse Practitioner

## 2020-07-31 DIAGNOSIS — E039 Hypothyroidism, unspecified: Secondary | ICD-10-CM

## 2020-08-05 DIAGNOSIS — Z794 Long term (current) use of insulin: Secondary | ICD-10-CM | POA: Diagnosis not present

## 2020-08-05 DIAGNOSIS — E109 Type 1 diabetes mellitus without complications: Secondary | ICD-10-CM | POA: Diagnosis not present

## 2020-08-07 ENCOUNTER — Other Ambulatory Visit: Payer: Self-pay | Admitting: Nurse Practitioner

## 2020-08-07 DIAGNOSIS — E785 Hyperlipidemia, unspecified: Secondary | ICD-10-CM

## 2020-08-13 ENCOUNTER — Other Ambulatory Visit: Payer: Self-pay | Admitting: Nurse Practitioner

## 2020-08-13 DIAGNOSIS — E109 Type 1 diabetes mellitus without complications: Secondary | ICD-10-CM

## 2020-08-25 DIAGNOSIS — U071 COVID-19: Secondary | ICD-10-CM | POA: Diagnosis not present

## 2020-08-25 DIAGNOSIS — Z20822 Contact with and (suspected) exposure to covid-19: Secondary | ICD-10-CM | POA: Diagnosis not present

## 2020-08-29 ENCOUNTER — Encounter: Payer: Self-pay | Admitting: Family Medicine

## 2020-08-29 ENCOUNTER — Ambulatory Visit (INDEPENDENT_AMBULATORY_CARE_PROVIDER_SITE_OTHER): Payer: BC Managed Care – PPO | Admitting: Family Medicine

## 2020-08-29 DIAGNOSIS — U071 COVID-19: Secondary | ICD-10-CM

## 2020-08-29 MED ORDER — PREDNISONE 20 MG PO TABS: ORAL_TABLET | ORAL | 0 refills | Status: DC

## 2020-08-29 MED ORDER — AMOXICILLIN 500 MG PO CAPS: 500.0000 mg | ORAL_CAPSULE | Freq: Two times a day (BID) | ORAL | 0 refills | Status: DC

## 2020-08-29 MED ORDER — ALBUTEROL SULFATE HFA 108 (90 BASE) MCG/ACT IN AERS: 2.0000 | INHALATION_SPRAY | Freq: Four times a day (QID) | RESPIRATORY_TRACT | 0 refills | Status: DC | PRN

## 2020-08-29 NOTE — Progress Notes (Signed)
Virtual Visit via telephone Note  I connected with Alejandra Mcdonald on 08/29/20 at 1748 by telephone and verified that I am speaking with the correct person using two identifiers. Alejandra Mcdonald is currently located at home and patient are currently with her during visit. The provider, Fransisca Kaufmann Deontray Hunnicutt, MD is located in their office at time of visit.  Call ended at 1759  I discussed the limitations, risks, security and privacy concerns of performing an evaluation and management service by telephone and the availability of in person appointments. I also discussed with the patient that there may be a patient responsible charge related to this service. The patient expressed understanding and agreed to proceed.   History and Present Illness: Patient is calling in for 1 week of chest cold and tested positive 4 days ago.  She is taking mucinex and now has cough and chest congestion.  She was vaccinated.  She feels short of breath and is tight in chest.  She has had asthma as well.     Outpatient Encounter Medications as of 08/29/2020  Medication Sig  . albuterol (VENTOLIN HFA) 108 (90 Base) MCG/ACT inhaler Inhale 2 puffs into the lungs every 6 (six) hours as needed for wheezing or shortness of breath.  Marland Kitchen amoxicillin (AMOXIL) 500 MG capsule Take 1 capsule (500 mg total) by mouth 2 (two) times daily.  . predniSONE (DELTASONE) 20 MG tablet 2 po at same time daily for 5 days  . aspirin EC 81 MG tablet Take 81 mg by mouth daily. Swallow whole.  . Calcium Citrate-Vitamin D (CALCIUM + D PO) Take 1 tablet by mouth daily.   . Continuous Blood Gluc Receiver (FREESTYLE LIBRE 14 DAY READER) DEVI 1 Device by Does not apply route daily.  . Continuous Blood Gluc Sensor (FREESTYLE LIBRE 14 DAY SENSOR) MISC 1 Device by Does not apply route every 14 (fourteen) days.  Marland Kitchen HYDROcodone-acetaminophen (NORCO/VICODIN) 5-325 MG tablet Take 1-2 tablets by mouth every 4 (four) hours as needed for moderate pain.  . Insulin  Human (INSULIN PUMP) SOLN Inject into the skin See admin instructions. Novolog Insulin USE UP TO 100 UNITS VIA PUMP PER DAY  . levothyroxine (SYNTHROID) 125 MCG tablet TAKE 1 TABLET(125 MCG) BY MOUTH DAILY  . magnesium hydroxide (MILK OF MAGNESIA) 400 MG/5ML suspension Take 30 mLs by mouth once a week.   . methocarbamol (ROBAXIN) 500 MG tablet Take 1 tablet (500 mg total) by mouth every 6 (six) hours as needed for muscle spasms.  . Multiple Vitamin (MULTIVITAMIN WITH MINERALS) TABS tablet Take 1 tablet by mouth in the morning and at bedtime.  Marland Kitchen NOVOLOG 100 UNIT/ML injection ADMINISTER UP TO 100 UNITS VIA PUMP DAILY (Needs to be seen before next refill)  . psyllium (METAMUCIL SMOOTH TEXTURE) 58.6 % powder Take 1 packet by mouth daily. (Patient taking differently: Take 1 packet by mouth once a week. )  . simvastatin (ZOCOR) 40 MG tablet Take 1 tablet (40 mg total) by mouth daily at 6 PM. (Needs to be seen before next refill)   No facility-administered encounter medications on file as of 08/29/2020.    Review of Systems  Constitutional: Positive for fever. Negative for chills.  HENT: Positive for congestion, postnasal drip, rhinorrhea, sinus pressure, sneezing and sore throat. Negative for ear discharge and ear pain.   Eyes: Negative for pain, redness and visual disturbance.  Respiratory: Positive for cough, shortness of breath and wheezing. Negative for chest tightness.   Cardiovascular: Negative for chest  pain and leg swelling.  Genitourinary: Negative for difficulty urinating and dysuria.  Musculoskeletal: Negative for back pain and gait problem.  Skin: Negative for rash.  Neurological: Negative for light-headedness and headaches.  Psychiatric/Behavioral: Negative for agitation and behavioral problems.  All other systems reviewed and are negative.   Observations/Objective: Patient sounds comfortable and in no acute distress  Assessment and Plan: Problem List Items Addressed This Visit    None   Visit Diagnoses    COVID-19 virus infection    -  Primary   Relevant Medications   albuterol (VENTOLIN HFA) 108 (90 Base) MCG/ACT inhaler   predniSONE (DELTASONE) 20 MG tablet   amoxicillin (AMOXIL) 500 MG capsule      Patient is already quarantined, she already tested positive for COVID, gave some medicines to help her with her symptoms.  She is starting to get some short of breath and tightness but staying hydrated and keeping the fluids going. Follow up plan: Return if symptoms worsen or fail to improve.     I discussed the assessment and treatment plan with the patient. The patient was provided an opportunity to ask questions and all were answered. The patient agreed with the plan and demonstrated an understanding of the instructions.   The patient was advised to call back or seek an in-person evaluation if the symptoms worsen or if the condition fails to improve as anticipated.  The above assessment and management plan was discussed with the patient. The patient verbalized understanding of and has agreed to the management plan. Patient is aware to call the clinic if symptoms persist or worsen. Patient is aware when to return to the clinic for a follow-up visit. Patient educated on when it is appropriate to go to the emergency department.    I provided 11 minutes of non-face-to-face time during this encounter.    Worthy Rancher, MD

## 2020-09-05 ENCOUNTER — Encounter: Payer: Self-pay | Admitting: Nurse Practitioner

## 2020-09-05 ENCOUNTER — Other Ambulatory Visit: Payer: Self-pay

## 2020-09-05 ENCOUNTER — Ambulatory Visit: Payer: BC Managed Care – PPO | Admitting: Nurse Practitioner

## 2020-09-05 VITALS — BP 136/77 | HR 74 | Temp 98.0°F | Resp 20 | Ht 67.0 in | Wt 150.0 lb

## 2020-09-05 DIAGNOSIS — E109 Type 1 diabetes mellitus without complications: Secondary | ICD-10-CM

## 2020-09-05 DIAGNOSIS — K5904 Chronic idiopathic constipation: Secondary | ICD-10-CM

## 2020-09-05 DIAGNOSIS — Z794 Long term (current) use of insulin: Secondary | ICD-10-CM | POA: Diagnosis not present

## 2020-09-05 DIAGNOSIS — E785 Hyperlipidemia, unspecified: Secondary | ICD-10-CM

## 2020-09-05 DIAGNOSIS — E039 Hypothyroidism, unspecified: Secondary | ICD-10-CM

## 2020-09-05 LAB — BAYER DCA HB A1C WAIVED: HB A1C (BAYER DCA - WAIVED): 7.1 % — ABNORMAL HIGH (ref <7.0)

## 2020-09-05 MED ORDER — SIMVASTATIN 40 MG PO TABS: 40.0000 mg | ORAL_TABLET | Freq: Every day | ORAL | 1 refills | Status: DC

## 2020-09-05 MED ORDER — LEVOTHYROXINE SODIUM 125 MCG PO TABS: ORAL_TABLET | ORAL | 1 refills | Status: DC

## 2020-09-05 MED ORDER — NOVOLOG 100 UNIT/ML ~~LOC~~ SOLN: SUBCUTANEOUS | 5 refills | Status: DC

## 2020-09-05 NOTE — Addendum Note (Signed)
Addended by: Chevis Pretty on: 09/05/2020 04:01 PM   Modules accepted: Orders

## 2020-09-05 NOTE — Progress Notes (Signed)
Subjective:    Patient ID: Alejandra Mcdonald, female    DOB: 19-Feb-1961, 60 y.o.   MRN: [id number]   Chief Complaint: Medical Management of Chronic Issues    HPI:  1. Type 1 diabetes mellitus with target hemoglobin A1c of less than 7.5 percent (HCC) Is on insulin pump. Blood sugars are doing good. Sh ewa son prednisone for several days for covid and they were up while she was on prednisone. Lab Results  Component Value Date   HGBA1C 7.0 (H) 04/23/2020     2. Hyperlipidemia with target LDL less than 100 Does watch diet and stays very active. Lab Results  Component Value Date   CHOL 200 (H) 12/21/2019   HDL 97 12/21/2019   LDLCALC 88 12/21/2019   TRIG 87 12/21/2019   CHOLHDL 2.1 12/21/2019     3. Acquired hypothyroidism Having no problems that she is aware of. Lab Results  Component Value Date   TSH 1.330 12/21/2019     4. Chronic idiopathic constipation Has chronic constipation. Has been an issue for many years. Currently just doing milk of magnesia. She had colonoscopy in 2020 and had some polyps. She is to  Repeat in 5 years.    Outpatient Encounter Medications as of 09/05/2020  Medication Sig  . albuterol (VENTOLIN HFA) 108 (90 Base) MCG/ACT inhaler Inhale 2 puffs into the lungs every 6 (six) hours as needed for wheezing or shortness of breath.  Marland Kitchen amoxicillin (AMOXIL) 500 MG capsule Take 1 capsule (500 mg total) by mouth 2 (two) times daily.  Marland Kitchen aspirin EC 81 MG tablet Take 81 mg by mouth daily. Swallow whole.  . Calcium Citrate-Vitamin D (CALCIUM + D PO) Take 1 tablet by mouth daily.   . Continuous Blood Gluc Receiver (FREESTYLE LIBRE 14 DAY READER) DEVI 1 Device by Does not apply route daily.  . Continuous Blood Gluc Sensor (FREESTYLE LIBRE 14 DAY SENSOR) MISC 1 Device by Does not apply route every 14 (fourteen) days.  Marland Kitchen HYDROcodone-acetaminophen (NORCO/VICODIN) 5-325 MG tablet Take 1-2 tablets by mouth every 4 (four) hours as needed for moderate pain.  . Insulin  Human (INSULIN PUMP) SOLN Inject into the skin See admin instructions. Novolog Insulin USE UP TO 100 UNITS VIA PUMP PER DAY  . levothyroxine (SYNTHROID) 125 MCG tablet TAKE 1 TABLET(125 MCG) BY MOUTH DAILY  . magnesium hydroxide (MILK OF MAGNESIA) 400 MG/5ML suspension Take 30 mLs by mouth once a week.   . methocarbamol (ROBAXIN) 500 MG tablet Take 1 tablet (500 mg total) by mouth every 6 (six) hours as needed for muscle spasms.  . Multiple Vitamin (MULTIVITAMIN WITH MINERALS) TABS tablet Take 1 tablet by mouth in the morning and at bedtime.  Marland Kitchen NOVOLOG 100 UNIT/ML injection ADMINISTER UP TO 100 UNITS VIA PUMP DAILY (Needs to be seen before next refill)  . psyllium (METAMUCIL SMOOTH TEXTURE) 58.6 % powder Take 1 packet by mouth daily. (Patient taking differently: Take 1 packet by mouth once a week. )  . simvastatin (ZOCOR) 40 MG tablet Take 1 tablet (40 mg total) by mouth daily at 6 PM. (Needs to be seen before next refill)    Past Surgical History:  Procedure Laterality Date  . ANTERIOR CERVICAL DECOMPRESSION/DISCECTOMY FUSION 4 LEVELS N/A 04/24/2020   Procedure: ANTERIOR CERVICAL DECOMPRESSION FUSION CERVICAL 4-5, CERVICAL 5-6, CERVICAL 6-7 WITH INSTRUMENTATION AND ALLOGRAFT;  Surgeon: Phylliss Bob, MD;  Location: [facility];  Service: Orthopedics;  Laterality: N/A;  . APPENDECTOMY    . BACK SURGERY  03/2015  . BILATERAL CARPAL TUNNEL RELEASE    . BIOPSY  03/22/2019   Procedure: BIOPSY;  Surgeon: Rogene Houston, MD;  Location: [facility];  Service: Endoscopy;;  ascending colon polyp biopsy  . CATARACT EXTRACTION Bilateral may & june 2017  . CESAREAN SECTION    . COLONOSCOPY N/A 03/22/2019   Procedure: COLONOSCOPY;  Surgeon: Rogene Houston, MD;  Location: [facility];  Service: Endoscopy;  Laterality: N/A;  930  . POLYPECTOMY  03/22/2019   Procedure: POLYPECTOMY;  Surgeon: Rogene Houston, MD;  Location: [facility];  Service: Endoscopy;;  ascending colon, hepatic flexure  . TUBAL LIGATION       Family History  Problem Relation Age of Onset  . Hypertension Mother   . Diabetes Mother   . Hypertension Father   . Healthy Brother   . Healthy Brother   . Healthy Brother     New complaints: None today  Social history: Lives with her husband. Has new granddaughter  Controlled substance contract: n/a    Review of Systems  Constitutional: Negative for diaphoresis.  Eyes: Negative for pain.  Respiratory: Negative for shortness of breath.   Cardiovascular: Negative for chest pain, palpitations and leg swelling.  Gastrointestinal: Negative for abdominal pain.  Endocrine: Negative for polydipsia.  Skin: Negative for rash.  Neurological: Negative for dizziness, weakness and headaches.  Hematological: Does not bruise/bleed easily.  All other systems reviewed and are negative.      Objective:   Physical Exam Vitals and nursing note reviewed.  Constitutional:      General: She is not in acute distress.    Appearance: Normal appearance. She is well-developed and well-nourished.  HENT:     Head: Normocephalic.     Nose: Nose normal.     Mouth/Throat:     Mouth: Oropharynx is clear and moist.  Eyes:     Extraocular Movements: EOM normal.     Pupils: Pupils are equal, round, and reactive to light.  Neck:     Vascular: No carotid bruit or JVD.  Cardiovascular:     Rate and Rhythm: Normal rate and regular rhythm.     Pulses: Intact distal pulses.     Heart sounds: Normal heart sounds.  Pulmonary:     Effort: Pulmonary effort is normal. No respiratory distress.     Breath sounds: Normal breath sounds. No wheezing or rales.  Chest:     Chest wall: No tenderness.  Abdominal:     General: Bowel sounds are normal. There is no distension or abdominal bruit. Aorta is normal.     Palpations: Abdomen is soft. There is no hepatomegaly, splenomegaly, mass or pulsatile mass.     Tenderness: There is no abdominal tenderness.  Musculoskeletal:        General: No edema.  Normal range of motion.     Cervical back: Normal range of motion and neck supple.  Lymphadenopathy:     Cervical: No cervical adenopathy.  Skin:    General: Skin is warm and dry.  Neurological:     Mental Status: She is alert and oriented to person, place, and time.     Deep Tendon Reflexes: Reflexes are normal and symmetric.  Psychiatric:        Mood and Affect: Mood and affect normal.        Behavior: Behavior normal.        Thought Content: Thought content normal.        Judgment: Judgment normal.  BP 136/77   Pulse 74   Temp 98 F (36.7 C) (Temporal)   Resp 20   Ht 5' 7"  (1.702 m)   Wt 150 lb (68 kg)   SpO2 97%   BMI 23.49 kg/m   >hgab1c 7.1      Assessment & Plan:  Sudiksha L Futch comes in today with chief complaint of Medical Management of Chronic Issues   Diagnosis and orders addressed:  1. Type 1 diabetes mellitus with target hemoglobin A1c of less than 7.5 percent (HCC) Continue to watch carbs in diet - Bayer DCA Hb A1c Waived - CMP14+EGFR - CBC with Differential/Platelet  2. Hyperlipidemia with target LDL less than 100 Low fat diet - Lipid panel - simvastatin (ZOCOR) 40 MG tablet; Take 1 tablet (40 mg total) by mouth daily at 6 PM. (Needs to be - levothyroxine (SYNTHROID) 125 MCG tablet; TAKE 1 TABLET(125 MCG) BY MOUTH DAILY  Dispense: 90 tablet; Refill: 1seen before next refill)  Dispense: 90 tablet; Refill: 1  3. Acquired hypothyroidism Labs pending  4. Chronic idiopathic constipation Keep doing what ever works for her     Labs pending Health Maintenance reviewed Diet and exercise encouraged  Follow up plan: 3 months   Mary-Margaret Hassell Done, FNP

## 2020-09-05 NOTE — Patient Instructions (Signed)
Diabetes Mellitus and Foot Care Foot care is an important part of your health, especially when you have diabetes. Diabetes may cause you to have problems because of poor blood flow (circulation) to your feet and legs, which can cause your skin to:  Become thinner and drier.  Break more easily.  Heal more slowly.  Peel and crack. You may also have nerve damage (neuropathy) in your legs and feet, causing decreased feeling in them. This means that you may not notice minor injuries to your feet that could lead to more serious problems. Noticing and addressing any potential problems early is the best way to prevent future foot problems. How to care for your feet Foot hygiene  Wash your feet daily with warm water and mild soap. Do not use hot water. Then, pat your feet and the areas between your toes until they are completely dry. Do not soak your feet as this can dry your skin.  Trim your toenails straight across. Do not dig under them or around the cuticle. File the edges of your nails with an emery board or nail file.  Apply a moisturizing lotion or petroleum jelly to the skin on your feet and to dry, brittle toenails. Use lotion that does not contain alcohol and is unscented. Do not apply lotion between your toes.   Shoes and socks  Wear clean socks or stockings every day. Make sure they are not too tight. Do not wear knee-high stockings since they may decrease blood flow to your legs.  Wear shoes that fit properly and have enough cushioning. Always look in your shoes before you put them on to be sure there are no objects inside.  To break in new shoes, wear them for just a few hours a day. This prevents injuries on your feet. Wounds, scrapes, corns, and calluses  Check your feet daily for blisters, cuts, bruises, sores, and redness. If you cannot see the bottom of your feet, use a mirror or ask someone for help.  Do not cut corns or calluses or try to remove them with medicine.  If you  find a minor scrape, cut, or break in the skin on your feet, keep it and the skin around it clean and dry. You may clean these areas with mild soap and water. Do not clean the area with peroxide, alcohol, or iodine.  If you have a wound, scrape, corn, or callus on your foot, look at it several times a day to make sure it is healing and not infected. Check for: ? Redness, swelling, or pain. ? Fluid or blood. ? Warmth. ? Pus or a bad smell.   General tips  Do not cross your legs. This may decrease blood flow to your feet.  Do not use heating pads or hot water bottles on your feet. They may burn your skin. If you have lost feeling in your feet or legs, you may not know this is happening until it is too late.  Protect your feet from hot and cold by wearing shoes, such as at the beach or on hot pavement.  Schedule a complete foot exam at least once a year (annually) or more often if you have foot problems. Report any cuts, sores, or bruises to your health care provider immediately. Where to find more information  American Diabetes Association: www.diabetes.org  Association of Diabetes Care & Education Specialists: www.diabeteseducator.org Contact a health care provider if:  You have a medical condition that increases your risk of infection and   you have any cuts, sores, or bruises on your feet.  You have an injury that is not healing.  You have redness on your legs or feet.  You feel burning or tingling in your legs or feet.  You have pain or cramps in your legs and feet.  Your legs or feet are numb.  Your feet always feel cold.  You have pain around any toenails. Get help right away if:  You have a wound, scrape, corn, or callus on your foot and: ? You have pain, swelling, or redness that gets worse. ? You have fluid or blood coming from the wound, scrape, corn, or callus. ? Your wound, scrape, corn, or callus feels warm to the touch. ? You have pus or a bad smell coming from  the wound, scrape, corn, or callus. ? You have a fever. ? You have a red line going up your leg. Summary  Check your feet every day for blisters, cuts, bruises, sores, and redness.  Apply a moisturizing lotion or petroleum jelly to the skin on your feet and to dry, brittle toenails.  Wear shoes that fit properly and have enough cushioning.  If you have foot problems, report any cuts, sores, or bruises to your health care provider immediately.  Schedule a complete foot exam at least once a year (annually) or more often if you have foot problems. This information is not intended to replace advice given to you by your health care provider. Make sure you discuss any questions you have with your health care provider. Document Revised: 02/22/2020 Document Reviewed: 02/22/2020 Elsevier Patient Education  2021 [employer]  

## 2020-09-06 LAB — CMP14+EGFR
ALT: 32 IU/L (ref 0–32)
AST: 26 IU/L (ref 0–40)
Albumin/Globulin Ratio: 2.1 (ref 1.2–2.2)
Albumin: 4.1 g/dL (ref 3.8–4.9)
Alkaline Phosphatase: 84 IU/L (ref 44–121)
BUN/Creatinine Ratio: 9 — ABNORMAL LOW (ref 12–28)
BUN: 7 mg/dL — ABNORMAL LOW (ref 8–27)
Bilirubin Total: 0.2 mg/dL (ref 0.0–1.2)
CO2: 30 mmol/L — ABNORMAL HIGH (ref 20–29)
Calcium: 9.8 mg/dL (ref 8.7–10.3)
Chloride: 98 mmol/L (ref 96–106)
Creatinine, Ser: 0.77 mg/dL (ref 0.57–1.00)
GFR calc Af Amer: 97 mL/min/{1.73_m2} (ref >59)
GFR calc non Af Amer: 84 mL/min/{1.73_m2} (ref >59)
Globulin, Total: 2 g/dL (ref 1.5–4.5)
Glucose: 72 mg/dL (ref 65–99)
Potassium: 4.3 mmol/L (ref 3.5–5.2)
Sodium: 139 mmol/L (ref 134–144)
Total Protein: 6.1 g/dL (ref 6.0–8.5)

## 2020-09-06 LAB — CBC WITH DIFFERENTIAL/PLATELET
Basophils Absolute: 0.1 10*3/uL (ref 0.0–0.2)
Basos: 1 %
EOS (ABSOLUTE): 0.3 10*3/uL (ref 0.0–0.4)
Eos: 4 %
Hematocrit: 40.1 % (ref 34.0–46.6)
Hemoglobin: 13.9 g/dL (ref 11.1–15.9)
Immature Grans (Abs): 0.1 10*3/uL (ref 0.0–0.1)
Immature Granulocytes: 1 %
Lymphocytes Absolute: 2.8 10*3/uL (ref 0.7–3.1)
Lymphs: 32 %
MCH: 32 pg (ref 26.6–33.0)
MCHC: 34.7 g/dL (ref 31.5–35.7)
MCV: 92 fL (ref 79–97)
Monocytes Absolute: 0.6 10*3/uL (ref 0.1–0.9)
Monocytes: 7 %
Neutrophils Absolute: 4.7 10*3/uL (ref 1.4–7.0)
Neutrophils: 55 %
Platelets: 404 10*3/uL (ref 150–450)
RBC: 4.34 x10E6/uL (ref 3.77–5.28)
RDW: 12 % (ref 11.7–15.4)
WBC: 8.6 10*3/uL (ref 3.4–10.8)

## 2020-09-06 LAB — LIPID PANEL
Chol/HDL Ratio: 2.5 ratio (ref 0.0–4.4)
Cholesterol, Total: 220 mg/dL — ABNORMAL HIGH (ref 100–199)
HDL: 88 mg/dL (ref >39)
LDL Chol Calc (NIH): 114 mg/dL — ABNORMAL HIGH (ref 0–99)
Triglycerides: 105 mg/dL (ref 0–149)
VLDL Cholesterol Cal: 18 mg/dL (ref 5–40)

## 2020-10-06 DIAGNOSIS — E109 Type 1 diabetes mellitus without complications: Secondary | ICD-10-CM | POA: Diagnosis not present

## 2020-10-06 DIAGNOSIS — Z794 Long term (current) use of insulin: Secondary | ICD-10-CM | POA: Diagnosis not present

## 2020-10-28 DIAGNOSIS — E109 Type 1 diabetes mellitus without complications: Secondary | ICD-10-CM | POA: Diagnosis not present

## 2020-10-28 DIAGNOSIS — Z794 Long term (current) use of insulin: Secondary | ICD-10-CM | POA: Diagnosis not present

## 2020-11-28 DIAGNOSIS — E109 Type 1 diabetes mellitus without complications: Secondary | ICD-10-CM | POA: Diagnosis not present

## 2020-11-28 DIAGNOSIS — Z794 Long term (current) use of insulin: Secondary | ICD-10-CM | POA: Diagnosis not present

## 2020-12-04 ENCOUNTER — Ambulatory Visit: Payer: BC Managed Care – PPO | Admitting: Nurse Practitioner

## 2020-12-04 ENCOUNTER — Other Ambulatory Visit: Payer: Self-pay

## 2020-12-04 ENCOUNTER — Encounter: Payer: Self-pay | Admitting: Nurse Practitioner

## 2020-12-04 VITALS — BP 131/72 | HR 70 | Temp 97.9°F | Resp 20 | Ht 67.0 in | Wt 150.0 lb

## 2020-12-04 DIAGNOSIS — E039 Hypothyroidism, unspecified: Secondary | ICD-10-CM

## 2020-12-04 DIAGNOSIS — K5904 Chronic idiopathic constipation: Secondary | ICD-10-CM | POA: Diagnosis not present

## 2020-12-04 DIAGNOSIS — E785 Hyperlipidemia, unspecified: Secondary | ICD-10-CM | POA: Diagnosis not present

## 2020-12-04 DIAGNOSIS — E109 Type 1 diabetes mellitus without complications: Secondary | ICD-10-CM

## 2020-12-04 LAB — BAYER DCA HB A1C WAIVED: HB A1C (BAYER DCA - WAIVED): 7.1 % — ABNORMAL HIGH (ref <7.0)

## 2020-12-04 MED ORDER — NOVOLOG 100 UNIT/ML ~~LOC~~ SOLN: SUBCUTANEOUS | 5 refills | Status: DC

## 2020-12-04 MED ORDER — LEVOTHYROXINE SODIUM 125 MCG PO TABS: ORAL_TABLET | ORAL | 1 refills | Status: DC

## 2020-12-04 MED ORDER — SIMVASTATIN 40 MG PO TABS: 40.0000 mg | ORAL_TABLET | Freq: Every day | ORAL | 1 refills | Status: DC

## 2020-12-04 NOTE — Progress Notes (Signed)
Subjective:    Patient ID: Alejandra Mcdonald, female    DOB: 03-16-1961, 60 y.o.   MRN: [id number]   Chief Complaint: Medical Management of Chronic Issues    HPI:  1. Type 1 diabetes mellitus with target hemoglobin A1c of less than 7.5 percent (HCC) Patient has insulin pump. Her blood sugars have been running around 130-160. She has Elenor Legato so se checks them often. Lab Results  Component Value Date   HGBA1C 7.1 (H) 09/05/2020     2. Hyperlipidemia with target LDL less than 100 Does watch diet and stays very active. Lab Results  Component Value Date   CHOL 220 (H) 09/05/2020   HDL 88 09/05/2020   LDLCALC 114 (H) 09/05/2020   TRIG 105 09/05/2020   CHOLHDL 2.5 09/05/2020     3. Acquired hypothyroidism No problems that she is aware of. Lab Results  Component Value Date   TSH 1.330 12/21/2019     4. Chronic idiopathic constipation No better. She is doing milk of magnesia and stool softners. That is the only thing that works.    Outpatient Encounter Medications as of 12/04/2020  Medication Sig  . albuterol (VENTOLIN HFA) 108 (90 Base) MCG/ACT inhaler Inhale 2 puffs into the lungs every 6 (six) hours as needed for wheezing or shortness of breath.  Marland Kitchen aspirin EC 81 MG tablet Take 81 mg by mouth daily. Swallow whole.  . Calcium Citrate-Vitamin D (CALCIUM + D PO) Take 1 tablet by mouth daily.   . Continuous Blood Gluc Receiver (FREESTYLE LIBRE 14 DAY READER) DEVI 1 Device by Does not apply route daily.  . Continuous Blood Gluc Sensor (FREESTYLE LIBRE 14 DAY SENSOR) MISC 1 Device by Does not apply route every 14 (fourteen) days.  . Insulin Human (INSULIN PUMP) SOLN Inject into the skin See admin instructions. Novolog Insulin USE UP TO 100 UNITS VIA PUMP PER DAY  . levothyroxine (SYNTHROID) 125 MCG tablet TAKE 1 TABLET(125 MCG) BY MOUTH DAILY  . magnesium hydroxide (MILK OF MAGNESIA) 400 MG/5ML suspension Take 30 mLs by mouth once a week.  . Multiple Vitamin (MULTIVITAMIN WITH  MINERALS) TABS tablet Take 1 tablet by mouth in the morning and at bedtime.  Marland Kitchen NOVOLOG 100 UNIT/ML injection ADMINISTER UP TO 100 UNITS VIA PUMP DAILY (Needs to be seen before next refill)  . simvastatin (ZOCOR) 40 MG tablet Take 1 tablet (40 mg total) by mouth daily at 6 PM. (Needs to be seen before next refill)   No facility-administered encounter medications on file as of 12/04/2020.    Past Surgical History:  Procedure Laterality Date  . ANTERIOR CERVICAL DECOMPRESSION/DISCECTOMY FUSION 4 LEVELS N/A 04/24/2020   Procedure: ANTERIOR CERVICAL DECOMPRESSION FUSION CERVICAL 4-5, CERVICAL 5-6, CERVICAL 6-7 WITH INSTRUMENTATION AND ALLOGRAFT;  Surgeon: Phylliss Bob, MD;  Location: [place];  Service: Orthopedics;  Laterality: N/A;  . APPENDECTOMY    . BACK SURGERY  03/2015  . BILATERAL CARPAL TUNNEL RELEASE    . BIOPSY  03/22/2019   Procedure: BIOPSY;  Surgeon: Rogene Houston, MD;  [facility];  Service: Endoscopy;;  ascending colon polyp biopsy  . CATARACT EXTRACTION Bilateral may & june 2017  . CESAREAN SECTION    . COLONOSCOPY N/A 03/22/2019   Procedure: COLONOSCOPY;  Surgeon: Rogene Houston, MD;  [facility];  Service: Endoscopy;  Laterality: N/A;  930  . POLYPECTOMY  03/22/2019   Procedure: POLYPECTOMY;  Surgeon: Rogene Houston, MD;  [facility];  Service:  Endoscopy;;  ascending colon, hepatic flexure  . TUBAL LIGATION      Family History  Problem Relation Age of Onset  . Hypertension Mother   . Diabetes Mother   . Hypertension Father   . Healthy Brother   . Healthy Brother   . Healthy Brother     New complaints: None today  Social history: Lives with her husband. He was in a bad car accident and is still having problems.  Controlled substance contract: n/a    Review of Systems  Constitutional: Negative for diaphoresis.  Eyes: Negative for pain.  Respiratory: Negative for shortness of breath.   Cardiovascular: Negative for chest  pain, palpitations and leg swelling.  Gastrointestinal: Negative for abdominal pain.  Endocrine: Negative for polydipsia.  Skin: Negative for rash.  Neurological: Negative for dizziness, weakness and headaches.  Hematological: Does not bruise/bleed easily.  All other systems reviewed and are negative.      Objective:   Physical Exam Vitals and nursing note reviewed.  Constitutional:      General: She is not in acute distress.    Appearance: Normal appearance. She is well-developed.  HENT:     Head: Normocephalic.     Nose: Nose normal.  Eyes:     Pupils: Pupils are equal, round, and reactive to light.  Neck:     Vascular: No carotid bruit or JVD.  Cardiovascular:     Rate and Rhythm: Normal rate and regular rhythm.     Heart sounds: Normal heart sounds.  Pulmonary:     Effort: Pulmonary effort is normal. No respiratory distress.     Breath sounds: Normal breath sounds. No wheezing or rales.  Chest:     Chest wall: No tenderness.  Abdominal:     General: Bowel sounds are normal. There is no distension or abdominal bruit.     Palpations: Abdomen is soft. There is no hepatomegaly, splenomegaly, mass or pulsatile mass.     Tenderness: There is no abdominal tenderness.  Musculoskeletal:        General: Normal range of motion.     Cervical back: Normal range of motion and neck supple.  Lymphadenopathy:     Cervical: No cervical adenopathy.  Skin:    General: Skin is warm and dry.  Neurological:     Mental Status: She is alert and oriented to person, place, and time.     Deep Tendon Reflexes: Reflexes are normal and symmetric.  Psychiatric:        Behavior: Behavior normal.        Thought Content: Thought content normal.        Judgment: Judgment normal.    BP 131/72   Pulse 70   Temp 97.9 F (36.6 C) (Temporal)   Resp 20   Ht $R'5\' 7"'Mz$  (1.702 m)   Wt 150 lb (68 kg)   SpO2 98%   BMI 23.49 kg/m   hgba1c 7.1%      Assessment & Plan:  [name] comes in today  with chief complaint of Medical Management of Chronic Issues   Diagnosis and orders addressed:  1. Type 1 diabetes mellitus with target hemoglobin A1c of less than 7.5 percent (HCC) continueto wtahc carbsin diet - Bayer DCA Hb A1c Waived - Microalbumin / creatinine urine ratio - NOVOLOG 100 UNIT/ML injection; ADMINISTER UP TO 100 UNITS VIA PUMP DAILY (Needs to be seen before next refill)  Dispense: 30 mL; Refill: 5  2. Hyperlipidemia with target LDL less than 100  Low fat diet - CBC with Differential/Platelet - CMP14+EGFR - Lipid panel - simvastatin (ZOCOR) 40 MG tablet; Take 1 tablet (40 mg total) by mouth daily at 6 PM. (Needs to be seen before next refill)  Dispense: 90 tablet; Refill: 1  3. Acquired hypothyroidism Labs pending  levothyroxine (SYNTHROID) 125 MCG tablet; TAKE 1 TABLET(125 MCG) BY MOUTH DAILY  Dispense: 90 tablet; Refill: 1  4. Chronic idiopathic constipation Continue current routine    Labs pending Health Maintenance reviewed Diet and exercise encouraged  Follow up plan: 3 months   Mary-Margaret Hassell Done, FNP

## 2020-12-04 NOTE — Patient Instructions (Signed)
Constipation, Adult Constipation is when a person has trouble pooping (having a bowel movement). When you have this condition, you may poop fewer than 3 times a week. Your poop (stool) may also be dry, hard, or bigger than normal. Follow these instructions at home: Eating and drinking  Eat foods that have a lot of fiber, such as: ? Fresh fruits and vegetables. ? Whole grains. ? Beans.  Eat less of foods that are low in fiber and high in fat and sugar, such as: ? French fries. ? Hamburgers. ? Cookies. ? Candy. ? Soda.  Drink enough fluid to keep your pee (urine) pale yellow.   General instructions  Exercise regularly or as told by your doctor. Try to do 150 minutes of exercise each week.  Go to the restroom when you feel like you need to poop. Do not hold it in.  Take over-the-counter and prescription medicines only as told by your doctor. These include any fiber supplements.  When you poop: ? Do deep breathing while relaxing your lower belly (abdomen). ? Relax your pelvic floor. The pelvic floor is a group of muscles that support the rectum, bladder, and intestines (as well as the uterus in women).  Watch your condition for any changes. Tell your doctor if you notice any.  Keep all follow-up visits as told by your doctor. This is important. Contact a doctor if:  You have pain that gets worse.  You have a fever.  You have not pooped for 4 days.  You vomit.  You are not hungry.  You lose weight.  You are bleeding from the opening of the butt (anus).  You have thin, pencil-like poop. Get help right away if:  You have a fever, and your symptoms suddenly get worse.  You leak poop or have blood in your poop.  Your belly feels hard or bigger than normal (bloated).  You have very bad belly pain.  You feel dizzy or you faint. Summary  Constipation is when a person poops fewer than 3 times a week, has trouble pooping, or has poop that is dry, hard, or bigger than  normal.  Eat foods that have a lot of fiber.  Drink enough fluid to keep your pee (urine) pale yellow.  Take over-the-counter and prescription medicines only as told by your doctor. These include any fiber supplements. This information is not intended to replace advice given to you by your health care provider. Make sure you discuss any questions you have with your health care provider. Document Revised: 06/21/2019 Document Reviewed: 06/21/2019 Elsevier Patient Education  2021 Elsevier Inc.  

## 2020-12-05 LAB — MICROALBUMIN / CREATININE URINE RATIO
Creatinine, Urine: 47.4 mg/dL
Microalb/Creat Ratio: <6 mg/g creat (ref 0–29)
Microalbumin, Urine: <3 ug/mL

## 2020-12-05 LAB — CBC WITH DIFFERENTIAL/PLATELET
Basophils Absolute: 0.1 10*3/uL (ref 0.0–0.2)
Basos: 2 %
EOS (ABSOLUTE): 0.2 10*3/uL (ref 0.0–0.4)
Eos: 4 %
Hematocrit: 41.1 % (ref 34.0–46.6)
Hemoglobin: 13.9 g/dL (ref 11.1–15.9)
Immature Grans (Abs): 0 10*3/uL (ref 0.0–0.1)
Immature Granulocytes: 0 %
Lymphocytes Absolute: 1.6 10*3/uL (ref 0.7–3.1)
Lymphs: 31 %
MCH: 31.8 pg (ref 26.6–33.0)
MCHC: 33.8 g/dL (ref 31.5–35.7)
MCV: 94 fL (ref 79–97)
Monocytes Absolute: 0.3 10*3/uL (ref 0.1–0.9)
Monocytes: 6 %
Neutrophils Absolute: 2.8 10*3/uL (ref 1.4–7.0)
Neutrophils: 57 %
Platelets: 310 10*3/uL (ref 150–450)
RBC: 4.37 x10E6/uL (ref 3.77–5.28)
RDW: 12 % (ref 11.7–15.4)
WBC: 5 10*3/uL (ref 3.4–10.8)

## 2020-12-05 LAB — CMP14+EGFR
ALT: 21 IU/L (ref 0–32)
AST: 21 IU/L (ref 0–40)
Albumin/Globulin Ratio: 2.6 — ABNORMAL HIGH (ref 1.2–2.2)
Albumin: 4.5 g/dL (ref 3.8–4.9)
Alkaline Phosphatase: 65 IU/L (ref 44–121)
BUN/Creatinine Ratio: 16 (ref 12–28)
BUN: 14 mg/dL (ref 8–27)
Bilirubin Total: 0.3 mg/dL (ref 0.0–1.2)
CO2: 26 mmol/L (ref 20–29)
Calcium: 9.5 mg/dL (ref 8.7–10.3)
Chloride: 99 mmol/L (ref 96–106)
Creatinine, Ser: 0.85 mg/dL (ref 0.57–1.00)
Globulin, Total: 1.7 g/dL (ref 1.5–4.5)
Glucose: 204 mg/dL — ABNORMAL HIGH (ref 65–99)
Potassium: 4.5 mmol/L (ref 3.5–5.2)
Sodium: 141 mmol/L (ref 134–144)
Total Protein: 6.2 g/dL (ref 6.0–8.5)
eGFR: 78 mL/min/{1.73_m2} (ref >59)

## 2020-12-05 LAB — LIPID PANEL
Chol/HDL Ratio: 2.2 ratio (ref 0.0–4.4)
Cholesterol, Total: 211 mg/dL — ABNORMAL HIGH (ref 100–199)
HDL: 97 mg/dL (ref >39)
LDL Chol Calc (NIH): 101 mg/dL — ABNORMAL HIGH (ref 0–99)
Triglycerides: 77 mg/dL (ref 0–149)
VLDL Cholesterol Cal: 13 mg/dL (ref 5–40)

## 2020-12-28 DIAGNOSIS — E109 Type 1 diabetes mellitus without complications: Secondary | ICD-10-CM | POA: Diagnosis not present

## 2020-12-28 DIAGNOSIS — Z794 Long term (current) use of insulin: Secondary | ICD-10-CM | POA: Diagnosis not present

## 2021-01-01 ENCOUNTER — Telehealth: Payer: Self-pay

## 2021-01-01 NOTE — Telephone Encounter (Signed)
Called patient in regards to making mammo appt - pt will cb to make appt

## 2021-01-28 DIAGNOSIS — Z794 Long term (current) use of insulin: Secondary | ICD-10-CM | POA: Diagnosis not present

## 2021-01-28 DIAGNOSIS — E109 Type 1 diabetes mellitus without complications: Secondary | ICD-10-CM | POA: Diagnosis not present

## 2021-02-12 ENCOUNTER — Other Ambulatory Visit: Payer: Self-pay | Admitting: Nurse Practitioner

## 2021-02-12 DIAGNOSIS — Z1231 Encounter for screening mammogram for malignant neoplasm of breast: Secondary | ICD-10-CM

## 2021-02-20 ENCOUNTER — Encounter: Payer: Self-pay | Admitting: Family Medicine

## 2021-02-20 ENCOUNTER — Ambulatory Visit (INDEPENDENT_AMBULATORY_CARE_PROVIDER_SITE_OTHER): Payer: BC Managed Care – PPO | Admitting: Family Medicine

## 2021-02-20 DIAGNOSIS — J0110 Acute frontal sinusitis, unspecified: Secondary | ICD-10-CM | POA: Diagnosis not present

## 2021-02-20 MED ORDER — AMOXICILLIN 875 MG PO TABS: 875.0000 mg | ORAL_TABLET | Freq: Two times a day (BID) | ORAL | 0 refills | Status: AC

## 2021-02-20 NOTE — Progress Notes (Addendum)
   Virtual Visit  Note Due to COVID-19 pandemic this visit was conducted virtually. This visit type was conducted due to national recommendations for restrictions regarding the COVID-19 Pandemic (e.g. social distancing, sheltering in place) in an effort to limit this patient's exposure and mitigate transmission in our community. All issues noted in this document were discussed and addressed.  A physical exam was not performed with this format.  I connected with Alejandra Mcdonald on 02/20/21 at 1019 by telephone and verified that I am speaking with the correct person using two identifiers. Alejandra Mcdonald is currently located at home and no one is currently with her during the visit. The provider, Gwenlyn Perking, FNP is located in their office at time of visit.  I discussed the limitations, risks, security and privacy concerns of performing an evaluation and management service by telephone and the availability of in person appointments. I also discussed with the patient that there may be a patient responsible charge related to this service. The patient expressed understanding and agreed to proceed. She has had a negative home Covid test.   CC: sore throat  History and Present Illness:  HPI Alejandra Mcdonald reports a sore throat, headache and head congestion for 7 days. She also reports a mild cough. Her headache feels like a pressure in the frontal area. It has been constant for the last week. Her other symptoms seemed to get better after a few days and then worsened again. She denies fever, chills, body aches, vomiting, diarrhea, shortness of breath, or chest pain. Denies exudate, swelling, or erythema of her throat. She has tried sinus medications, tylenol, and throat spray without much improvement.     ROS As per HPI.   Observations/Objective: Alert and oriented x 3. Able to speak in full sentences without difficulty.   Assessment and Plan: Alejandra Mcdonald was seen today for sore throat.  Diagnoses and all  orders for this visit:  Acute non-recurrent frontal sinusitis Amoxicillin as below. Tylenol for pain. Mucinex, nasal saline for congestion. Stay well hydrated.  -     amoxicillin (AMOXIL) 875 MG tablet; Take 1 tablet (875 mg total) by mouth 2 (two) times daily for 10 days.    Follow Up Instructions: Return to office for new or worsening symptoms, or if symptoms persist.     I discussed the assessment and treatment plan with the patient. The patient was provided an opportunity to ask questions and all were answered. The patient agreed with the plan and demonstrated an understanding of the instructions.   The patient was advised to call back or seek an in-person evaluation if the symptoms worsen or if the condition fails to improve as anticipated.  The above assessment and management plan was discussed with the patient. The patient verbalized understanding of and has agreed to the management plan. Patient is aware to call the clinic if symptoms persist or worsen. Patient is aware when to return to the clinic for a follow-up visit. Patient educated on when it is appropriate to go to the emergency department.   Time call ended:  1030  I provided 11 minutes of  non face-to-face time during this encounter.    Gwenlyn Perking, FNP

## 2021-02-27 DIAGNOSIS — Z794 Long term (current) use of insulin: Secondary | ICD-10-CM | POA: Diagnosis not present

## 2021-02-27 DIAGNOSIS — E109 Type 1 diabetes mellitus without complications: Secondary | ICD-10-CM | POA: Diagnosis not present

## 2021-03-03 ENCOUNTER — Other Ambulatory Visit: Payer: Self-pay

## 2021-03-03 ENCOUNTER — Encounter: Payer: Self-pay | Admitting: Nurse Practitioner

## 2021-03-03 ENCOUNTER — Ambulatory Visit: Payer: BC Managed Care – PPO | Admitting: Nurse Practitioner

## 2021-03-03 VITALS — BP 137/80 | HR 74 | Temp 98.0°F | Resp 20 | Ht 67.0 in | Wt 151.0 lb

## 2021-03-03 DIAGNOSIS — Z23 Encounter for immunization: Secondary | ICD-10-CM | POA: Diagnosis not present

## 2021-03-03 DIAGNOSIS — K5904 Chronic idiopathic constipation: Secondary | ICD-10-CM | POA: Diagnosis not present

## 2021-03-03 DIAGNOSIS — E039 Hypothyroidism, unspecified: Secondary | ICD-10-CM | POA: Diagnosis not present

## 2021-03-03 DIAGNOSIS — E109 Type 1 diabetes mellitus without complications: Secondary | ICD-10-CM | POA: Diagnosis not present

## 2021-03-03 DIAGNOSIS — E785 Hyperlipidemia, unspecified: Secondary | ICD-10-CM

## 2021-03-03 LAB — BAYER DCA HB A1C WAIVED: HB A1C (BAYER DCA - WAIVED): 7.1 % — ABNORMAL HIGH (ref <7.0)

## 2021-03-03 MED ORDER — SIMVASTATIN 40 MG PO TABS: 40.0000 mg | ORAL_TABLET | Freq: Every day | ORAL | 1 refills | Status: DC

## 2021-03-03 MED ORDER — LEVOTHYROXINE SODIUM 125 MCG PO TABS: ORAL_TABLET | ORAL | 1 refills | Status: DC

## 2021-03-03 NOTE — Addendum Note (Signed)
Addended by: Rolena Infante on: 03/03/2021 04:59 PM   Modules accepted: Orders

## 2021-03-03 NOTE — Progress Notes (Signed)
Subjective:    Patient ID: Alejandra Mcdonald, female    DOB: Jan 08, 1961, 60 y.o.   MRN: [id number]   Chief Complaint: Medical Management of Chronic Issues    HPI:  1. Type 1 diabetes mellitus with target hemoglobin A1c of less than 7.5 percent [facility]) Patient has juvenile onset diabetes. She has an insulin pump. Blood sugars have been unchanged since last visit. Lab Results  Component Value Date   HGBA1C 7.1 (H) 12/04/2020     2. Hyperlipidemia with target LDL less than 100 Does try to watch diet and stays very active. Lab Results  Component Value Date   CHOL 211 (H) 12/04/2020   HDL 97 12/04/2020   LDLCALC 101 (H) 12/04/2020   TRIG 77 12/04/2020   CHOLHDL 2.2 12/04/2020     3. Acquired hypothyroidism No problems that she is aware of. Lab Results  Component Value Date   TSH 1.330 12/21/2019     4. Chronic idiopathic constipation Has had constipation al of her life and is able to handle it on her own.    Outpatient Encounter Medications as of 03/03/2021  Medication Sig   albuterol (VENTOLIN HFA) 108 (90 Base) MCG/ACT inhaler Inhale 2 puffs into the lungs every 6 (six) hours as needed for wheezing or shortness of breath.   aspirin EC 81 MG tablet Take 81 mg by mouth daily. Swallow whole.   Calcium Citrate-Vitamin D (CALCIUM + D PO) Take 1 tablet by mouth daily.    Continuous Blood Gluc Receiver (FREESTYLE LIBRE 14 DAY READER) DEVI 1 Device by Does not apply route daily.   Continuous Blood Gluc Sensor (FREESTYLE LIBRE 14 DAY SENSOR) MISC 1 Device by Does not apply route every 14 (fourteen) days.   Insulin Human (INSULIN PUMP) SOLN Inject into the skin See admin instructions. Novolog Insulin USE UP TO 100 UNITS VIA PUMP PER DAY   levothyroxine (SYNTHROID) 125 MCG tablet TAKE 1 TABLET(125 MCG) BY MOUTH DAILY   magnesium hydroxide (MILK OF MAGNESIA) 400 MG/5ML suspension Take 30 mLs by mouth once a week.   Multiple Vitamin (MULTIVITAMIN WITH MINERALS) TABS tablet Take 1  tablet by mouth in the morning and at bedtime.   NOVOLOG 100 UNIT/ML injection ADMINISTER UP TO 100 UNITS VIA PUMP DAILY (Needs to be seen before next refill)   simvastatin (ZOCOR) 40 MG tablet Take 1 tablet (40 mg total) by mouth daily at 6 PM. (Needs to be seen before next refill)   No facility-administered encounter medications on file as of 03/03/2021.    Past Surgical History:  Procedure Laterality Date   ANTERIOR CERVICAL DECOMPRESSION/DISCECTOMY FUSION 4 LEVELS N/A 04/24/2020   Procedure: ANTERIOR CERVICAL DECOMPRESSION FUSION CERVICAL 4-5, CERVICAL 5-6, CERVICAL 6-7 WITH INSTRUMENTATION AND ALLOGRAFT;  Surgeon: Phylliss Bob, MD;  Location: [place];  Service: Orthopedics;  Laterality: N/A;   APPENDECTOMY     BACK SURGERY  03/2015   BILATERAL CARPAL TUNNEL RELEASE     BIOPSY  03/22/2019   Procedure: BIOPSY;  Surgeon: Rogene Houston, MD;  [facility];  Service: Endoscopy;;  ascending colon polyp biopsy   CATARACT EXTRACTION Bilateral may & june 2017   CESAREAN SECTION     COLONOSCOPY N/A 03/22/2019   Procedure: COLONOSCOPY;  Surgeon: Rogene Houston, MD;  [facility];  Service: Endoscopy;  Laterality: N/A;  930   POLYPECTOMY  03/22/2019   Procedure: POLYPECTOMY;  Surgeon: Rogene Houston, MD;  [facility];  Service: Endoscopy;;  ascending colon, hepatic flexure   TUBAL LIGATION      Family History  Problem Relation Age of Onset   Hypertension Mother    Diabetes Mother    Hypertension Father    Healthy Brother    Healthy Brother    Healthy Brother     New complaints: None today  Social history: Lives with her husband. Helps a lot with her granddaughter.  Controlled substance contract: n/a     Review of Systems  Constitutional:  Negative for diaphoresis.  Eyes:  Negative for pain.  Respiratory:  Negative for shortness of breath.   Cardiovascular:  Negative for chest pain, palpitations and leg swelling.  Gastrointestinal:  Negative  for abdominal pain.  Endocrine: Negative for polydipsia.  Skin:  Negative for rash.  Neurological:  Negative for dizziness, weakness and headaches.  Hematological:  Does not bruise/bleed easily.  All other systems reviewed and are negative.     Objective:   Physical Exam Vitals and nursing note reviewed.  Constitutional:      General: She is not in acute distress.    Appearance: Normal appearance. She is well-developed.  HENT:     Head: Normocephalic.     Right Ear: Tympanic membrane normal.     Left Ear: Tympanic membrane normal.     Nose: Nose normal.     Mouth/Throat:     Mouth: Mucous membranes are moist.  Eyes:     Pupils: Pupils are equal, round, and reactive to light.  Neck:     Vascular: No carotid bruit or JVD.  Cardiovascular:     Rate and Rhythm: Normal rate and regular rhythm.     Heart sounds: Normal heart sounds.  Pulmonary:     Effort: Pulmonary effort is normal. No respiratory distress.     Breath sounds: Normal breath sounds. No wheezing or rales.  Chest:     Chest wall: No tenderness.  Abdominal:     General: Bowel sounds are normal. There is no distension or abdominal bruit.     Palpations: Abdomen is soft. There is no hepatomegaly, splenomegaly, mass or pulsatile mass.     Tenderness: There is no abdominal tenderness.  Musculoskeletal:        General: Normal range of motion.     Cervical back: Normal range of motion and neck supple.  Lymphadenopathy:     Cervical: No cervical adenopathy.  Skin:    General: Skin is warm and dry.  Neurological:     Mental Status: She is alert and oriented to person, place, and time.     Deep Tendon Reflexes: Reflexes are normal and symmetric.  Psychiatric:        Behavior: Behavior normal.        Thought Content: Thought content normal.        Judgment: Judgment normal.    BP 137/80   Pulse 74   Temp 98 F (36.7 C) (Temporal)   Resp 20   Ht _0  (1.702 m)   Wt 151 lb (68.5 kg)   SpO2 97%   BMI 23.65  kg/m   HGBA1c 7.1%     Assessment & Plan:   Alejandra Mcdonald comes in today with chief complaint of Medical Management of Chronic Issues   Diagnosis and orders addressed:  1. Type 1 diabetes mellitus with target hemoglobin A1c of less than 7.5 percent (HCC) Continue to watch carbs in diet - Bayer DCA Hb A1c Waived - CBC with Differential/Platelet - CMP14+EGFR  2. Hyperlipidemia with target LDL less than 100 Low fat diet - Lipid panel - simvastatin (ZOCOR) 40 MG tablet; Take 1 tablet (40 mg total) by mouth daily at 6 PM. (Needs to be seen before next refill)  Dispense: 90 tablet; Refill: 1 - levothyroxine (SYNTHROID) 125 MCG tablet; TAKE 1 TABLET(125 MCG) BY MOUTH DAILY  Dispense: 90 tablet; Refill: 1  3. Acquired hypothyroidism Labs pending  4. Chronic idiopathic constipation Continue to eat lots of fiber   Labs pending Health Maintenance reviewed Diet and exercise encouraged  Follow up plan: 3 month   [place], FNP

## 2021-03-03 NOTE — Patient Instructions (Signed)
https://www.diabeteseducator.org/docs/default-source/living-with-diabetes/conquering-the-grocery-store-v1.pdf?sfvrsn=4">  Carbohydrate Counting for Diabetes Mellitus, Adult Carbohydrate counting is a method of keeping track of how many carbohydrates you eat. Eating carbohydrates naturally increases the amount of sugar (glucose) in the blood. Counting how many carbohydrates you eat improves your bloodglucose control, which helps you manage your diabetes. It is important to know how many carbohydrates you can safely have in each meal. This is different for every person. A dietitian can help you make a meal plan and calculate how many carbohydrates you should have at each meal andsnack. What foods contain carbohydrates? Carbohydrates are found in the following foods: Grains, such as breads and cereals. Dried beans and soy products. Starchy vegetables, such as potatoes, peas, and corn. Fruit and fruit juices. Milk and yogurt. Sweets and snack foods, such as cake, cookies, candy, chips, and soft drinks. How do I count carbohydrates in foods? There are two ways to count carbohydrates in food. You can read food labels or learn standard serving sizes of foods. You can use either of the methods or acombination of both. Using the Nutrition Facts label The Nutrition Facts list is included on the labels of almost all packaged foods and beverages in the [place] It includes: The serving size. Information about nutrients in each serving, including the grams (g) of carbohydrate per serving. To use the Nutrition Facts: Decide how many servings you will have. Multiply the number of servings by the number of carbohydrates per serving. The resulting number is the total amount of carbohydrates that you will be having. Learning the standard serving sizes of foods When you eat carbohydrate foods that are not packaged or do not include Nutrition Facts on the label, you need to measure the servings in order to count the  amount of carbohydrates. Measure the foods that you will eat with a food scale or measuring cup, if needed. Decide how many standard-size servings you will eat. Multiply the number of servings by 15. For foods that contain carbohydrates, one serving equals 15 g of carbohydrates. For example, if you eat 2 cups or 10 oz (300 g) of strawberries, you will have eaten 2 servings and 30 g of carbohydrates (2 servings x 15 g = 30 g). For foods that have more than one food mixed, such as soups and casseroles, you must count the carbohydrates in each food that is included. The following list contains standard serving sizes of common carbohydrate-rich foods. Each of these servings has about 15 g of carbohydrates: 1 slice of bread. 1 six-inch (15 cm) tortilla. ? cup or 2 oz (53 g) cooked rice or pasta.  cup or 3 oz (85 g) cooked or canned, drained and rinsed beans or lentils.  cup or 3 oz (85 g) starchy vegetable, such as peas, corn, or squash.  cup or 4 oz (120 g) hot cereal.  cup or 3 oz (85 g) boiled or mashed potatoes, or  or 3 oz (85 g) of a large baked potato.  cup or 4 fl oz (118 mL) fruit juice. 1 cup or 8 fl oz (237 mL) milk. 1 small or 4 oz (106 g) apple.  or 2 oz (63 g) of a medium banana. 1 cup or 5 oz (150 g) strawberries. 3 cups or 1 oz (24 g) popped popcorn. What is an example of carbohydrate counting? To calculate the number of carbohydrates in this sample meal, follow the stepsshown below. Sample meal 3 oz (85 g) chicken breast. ? cup or 4 oz (106 g) brown rice.    cup or 3 oz (85 g) corn. 1 cup or 8 fl oz (237 mL) milk. 1 cup or 5 oz (150 g) strawberries with sugar-free whipped topping. Carbohydrate calculation Identify the foods that contain carbohydrates: Rice. Corn. Milk. Strawberries. Calculate how many servings you have of each food: 2 servings rice. 1 serving corn. 1 serving milk. 1 serving strawberries. Multiply each number of servings by 15 g: 2 servings  rice x 15 g = 30 g. 1 serving corn x 15 g = 15 g. 1 serving milk x 15 g = 15 g. 1 serving strawberries x 15 g = 15 g. Add together all of the amounts to find the total grams of carbohydrates eaten: 30 g + 15 g + 15 g + 15 g = 75 g of carbohydrates total. What are tips for following this plan? Shopping Develop a meal plan and then make a shopping list. Buy fresh and frozen vegetables, fresh and frozen fruit, dairy, eggs, beans, lentils, and whole grains. Look at food labels. Choose foods that have more fiber and less sugar. Avoid processed foods and foods with added sugars. Meal planning Aim to have the same amount of carbohydrates at each meal and for each snack time. Plan to have regular, balanced meals and snacks. Where to find more information American Diabetes Association: www.diabetes.org Centers for Disease Control and Prevention: www.cdc.gov Summary Carbohydrate counting is a method of keeping track of how many carbohydrates you eat. Eating carbohydrates naturally increases the amount of sugar (glucose) in the blood. Counting how many carbohydrates you eat improves your blood glucose control, which helps you manage your diabetes. A dietitian can help you make a meal plan and calculate how many carbohydrates you should have at each meal and snack. This information is not intended to replace advice given to you by your health care provider. Make sure you discuss any questions you have with your healthcare provider. Document Revised: 08/03/2019 Document Reviewed: 08/04/2019 Elsevier Patient Education  2021 Elsevier Inc.  

## 2021-03-04 LAB — CBC WITH DIFFERENTIAL/PLATELET
Basophils Absolute: 0.1 10*3/uL (ref 0.0–0.2)
Basos: 1 %
EOS (ABSOLUTE): 0.2 10*3/uL (ref 0.0–0.4)
Eos: 3 %
Hematocrit: 43.4 % (ref 34.0–46.6)
Hemoglobin: 14.5 g/dL (ref 11.1–15.9)
Immature Grans (Abs): 0 10*3/uL (ref 0.0–0.1)
Immature Granulocytes: 0 %
Lymphocytes Absolute: 2.1 10*3/uL (ref 0.7–3.1)
Lymphs: 29 %
MCH: 31.5 pg (ref 26.6–33.0)
MCHC: 33.4 g/dL (ref 31.5–35.7)
MCV: 94 fL (ref 79–97)
Monocytes Absolute: 0.5 10*3/uL (ref 0.1–0.9)
Monocytes: 7 %
Neutrophils Absolute: 4.3 10*3/uL (ref 1.4–7.0)
Neutrophils: 60 %
Platelets: 342 10*3/uL (ref 150–450)
RBC: 4.6 x10E6/uL (ref 3.77–5.28)
RDW: 12.4 % (ref 11.7–15.4)
WBC: 7.1 10*3/uL (ref 3.4–10.8)

## 2021-03-04 LAB — CMP14+EGFR
ALT: 25 IU/L (ref 0–32)
AST: 26 IU/L (ref 0–40)
Albumin/Globulin Ratio: 2.4 — ABNORMAL HIGH (ref 1.2–2.2)
Albumin: 4.6 g/dL (ref 3.8–4.8)
Alkaline Phosphatase: 73 IU/L (ref 44–121)
BUN/Creatinine Ratio: 14 (ref 12–28)
BUN: 10 mg/dL (ref 8–27)
Bilirubin Total: 0.3 mg/dL (ref 0.0–1.2)
CO2: 28 mmol/L (ref 20–29)
Calcium: 10 mg/dL (ref 8.7–10.3)
Chloride: 100 mmol/L (ref 96–106)
Creatinine, Ser: 0.73 mg/dL (ref 0.57–1.00)
Globulin, Total: 1.9 g/dL (ref 1.5–4.5)
Glucose: 76 mg/dL (ref 65–99)
Potassium: 4.2 mmol/L (ref 3.5–5.2)
Sodium: 143 mmol/L (ref 134–144)
Total Protein: 6.5 g/dL (ref 6.0–8.5)
eGFR: 94 mL/min/{1.73_m2} (ref >59)

## 2021-03-04 LAB — LIPID PANEL
Chol/HDL Ratio: 2.3 ratio (ref 0.0–4.4)
Cholesterol, Total: 203 mg/dL — ABNORMAL HIGH (ref 100–199)
HDL: 90 mg/dL (ref >39)
LDL Chol Calc (NIH): 102 mg/dL — ABNORMAL HIGH (ref 0–99)
Triglycerides: 62 mg/dL (ref 0–149)
VLDL Cholesterol Cal: 11 mg/dL (ref 5–40)

## 2021-03-07 ENCOUNTER — Ambulatory Visit: Payer: BC Managed Care – PPO | Admitting: Nurse Practitioner

## 2021-03-24 ENCOUNTER — Other Ambulatory Visit: Payer: Self-pay

## 2021-03-24 ENCOUNTER — Ambulatory Visit
Admission: RE | Admit: 2021-03-24 | Discharge: 2021-03-24 | Disposition: A | Discharge status: Home / Self Care | Payer: BC Managed Care – PPO | Source: Ambulatory Visit | Attending: Nurse Practitioner | Admitting: Nurse Practitioner

## 2021-03-24 DIAGNOSIS — Z1231 Encounter for screening mammogram for malignant neoplasm of breast: Secondary | ICD-10-CM

## 2021-03-30 DIAGNOSIS — E109 Type 1 diabetes mellitus without complications: Secondary | ICD-10-CM | POA: Diagnosis not present

## 2021-03-30 DIAGNOSIS — Z794 Long term (current) use of insulin: Secondary | ICD-10-CM | POA: Diagnosis not present

## 2021-04-30 DIAGNOSIS — Z794 Long term (current) use of insulin: Secondary | ICD-10-CM | POA: Diagnosis not present

## 2021-04-30 DIAGNOSIS — E109 Type 1 diabetes mellitus without complications: Secondary | ICD-10-CM | POA: Diagnosis not present

## 2021-05-05 DIAGNOSIS — E109 Type 1 diabetes mellitus without complications: Secondary | ICD-10-CM | POA: Diagnosis not present

## 2021-05-30 DIAGNOSIS — E109 Type 1 diabetes mellitus without complications: Secondary | ICD-10-CM | POA: Diagnosis not present

## 2021-05-30 DIAGNOSIS — Z794 Long term (current) use of insulin: Secondary | ICD-10-CM | POA: Diagnosis not present

## 2021-06-06 ENCOUNTER — Other Ambulatory Visit (HOSPITAL_COMMUNITY)
Admission: RE | Admit: 2021-06-06 | Discharge: 2021-06-06 | Disposition: A | Discharge status: Home / Self Care | Payer: BC Managed Care – PPO | Source: Ambulatory Visit | Attending: Nurse Practitioner | Admitting: Nurse Practitioner

## 2021-06-06 ENCOUNTER — Encounter: Payer: Self-pay | Admitting: Nurse Practitioner

## 2021-06-06 ENCOUNTER — Other Ambulatory Visit: Payer: Self-pay

## 2021-06-06 ENCOUNTER — Ambulatory Visit: Payer: BC Managed Care – PPO | Admitting: Nurse Practitioner

## 2021-06-06 VITALS — BP 122/70 | HR 71 | Temp 98.0°F | Resp 20 | Ht 67.0 in | Wt 145.0 lb

## 2021-06-06 DIAGNOSIS — E039 Hypothyroidism, unspecified: Secondary | ICD-10-CM

## 2021-06-06 DIAGNOSIS — E109 Type 1 diabetes mellitus without complications: Secondary | ICD-10-CM | POA: Diagnosis not present

## 2021-06-06 DIAGNOSIS — Z23 Encounter for immunization: Secondary | ICD-10-CM | POA: Diagnosis not present

## 2021-06-06 DIAGNOSIS — Z0001 Encounter for general adult medical examination with abnormal findings: Secondary | ICD-10-CM | POA: Diagnosis not present

## 2021-06-06 DIAGNOSIS — Z0000 Encounter for general adult medical examination without abnormal findings: Secondary | ICD-10-CM

## 2021-06-06 DIAGNOSIS — E785 Hyperlipidemia, unspecified: Secondary | ICD-10-CM | POA: Diagnosis not present

## 2021-06-06 LAB — BAYER DCA HB A1C WAIVED: HB A1C (BAYER DCA - WAIVED): 7 % — ABNORMAL HIGH (ref 4.8–5.6)

## 2021-06-06 LAB — MICROSCOPIC EXAMINATION
Bacteria, UA: NONE SEEN
Epithelial Cells (non renal): NONE SEEN /hpf (ref 0–10)
RBC, Urine: NONE SEEN /hpf (ref 0–2)
Renal Epithel, UA: NONE SEEN /hpf
WBC, UA: NONE SEEN /hpf (ref 0–5)

## 2021-06-06 LAB — URINALYSIS, COMPLETE
Bilirubin, UA: NEGATIVE
Glucose, UA: NEGATIVE
Ketones, UA: NEGATIVE
Leukocytes,UA: NEGATIVE
Nitrite, UA: NEGATIVE
Protein,UA: NEGATIVE
RBC, UA: NEGATIVE
Specific Gravity, UA: 1.01 (ref 1.005–1.030)
Urobilinogen, Ur: 0.2 mg/dL (ref 0.2–1.0)
pH, UA: 6.5 (ref 5.0–7.5)

## 2021-06-06 NOTE — Progress Notes (Signed)
Subjective:    Patient ID: Alejandra Mcdonald, female    DOB: Mar 25, 1961, 60 y.o.   MRN: [id number]  Chief Complaint: annual physical with PAP   HPI:  1. Annual physical exam Having pap today  2. Type 1 diabetes mellitus with target hemoglobin A1c of less than 7.5 percent (HCC) Is currently on insulin pump. Blood sugars are under good control. Has no low blood sugars to report. Lab Results  Component Value Date   HGBA1C 7.1 (H) 03/03/2021     3. Acquired hypothyroidism No problems that she is aware of Lab Results  Component Value Date   TSH 1.330 12/21/2019     4. Hyperlipidemia with target LDL less than 100 Does watch diet and stays very active Lab Results  Component Value Date   CHOL 203 (H) 03/03/2021   HDL 90 03/03/2021   LDLCALC 102 (H) 03/03/2021   TRIG 62 03/03/2021   CHOLHDL 2.3 03/03/2021        Outpatient Encounter Medications as of 06/06/2021  Medication Sig   albuterol (VENTOLIN HFA) 108 (90 Base) MCG/ACT inhaler Inhale 2 puffs into the lungs every 6 (six) hours as needed for wheezing or shortness of breath.   aspirin EC 81 MG tablet Take 81 mg by mouth daily. Swallow whole.   Calcium Citrate-Vitamin D (CALCIUM + D PO) Take 1 tablet by mouth daily.    Continuous Blood Gluc Receiver (FREESTYLE LIBRE 14 DAY READER) DEVI 1 Device by Does not apply route daily.   Continuous Blood Gluc Sensor (FREESTYLE LIBRE 14 DAY SENSOR) MISC 1 Device by Does not apply route every 14 (fourteen) days.   Insulin Human (INSULIN PUMP) SOLN Inject into the skin See admin instructions. Novolog Insulin USE UP TO 100 UNITS VIA PUMP PER DAY   levothyroxine (SYNTHROID) 125 MCG tablet TAKE 1 TABLET(125 MCG) BY MOUTH DAILY   magnesium hydroxide (MILK OF MAGNESIA) 400 MG/5ML suspension Take 30 mLs by mouth once a week.   Multiple Vitamin (MULTIVITAMIN WITH MINERALS) TABS tablet Take 1 tablet by mouth in the morning and at bedtime.   NOVOLOG 100 UNIT/ML injection ADMINISTER UP TO 100  UNITS VIA PUMP DAILY (Needs to be seen before next refill)   simvastatin (ZOCOR) 40 MG tablet Take 1 tablet (40 mg total) by mouth daily at 6 PM. (Needs to be seen before next refill)   No facility-administered encounter medications on file as of 06/06/2021.    Past Surgical History:  Procedure Laterality Date   ANTERIOR CERVICAL DECOMPRESSION/DISCECTOMY FUSION 4 LEVELS N/A 04/24/2020   Procedure: ANTERIOR CERVICAL DECOMPRESSION FUSION CERVICAL 4-5, CERVICAL 5-6, CERVICAL 6-7 WITH INSTRUMENTATION AND ALLOGRAFT;  Surgeon: Phylliss Bob, MD;  Location: [facility];  Service: Orthopedics;  Laterality: N/A;   APPENDECTOMY     BACK SURGERY  03/2015   BILATERAL CARPAL TUNNEL RELEASE     BIOPSY  03/22/2019   Procedure: BIOPSY;  Surgeon: Rogene Houston, MD;  [facility];  Service: Endoscopy;;  ascending colon polyp biopsy   CATARACT EXTRACTION Bilateral may & june 2017   CESAREAN SECTION     COLONOSCOPY N/A 03/22/2019   Procedure: COLONOSCOPY;  Surgeon: Rogene Houston, MD;  [facility];  Service: Endoscopy;  Laterality: N/A;  930   POLYPECTOMY  03/22/2019   Procedure: POLYPECTOMY;  Surgeon: Rogene Houston, MD;  [facility];  Service: Endoscopy;;  ascending colon, hepatic flexure   TUBAL LIGATION      Family History  Problem  Relation Age of Onset   Hypertension Mother    Diabetes Mother    Hypertension Father    Healthy Brother    Healthy Brother    Healthy Brother    Breast cancer Neg Hx     New complaints: None today  Social history: Lives with her husband  Controlled substance contract: n/a     Review of Systems  Constitutional:  Negative for diaphoresis.  Eyes:  Negative for pain.  Respiratory:  Negative for shortness of breath.   Cardiovascular:  Negative for chest pain, palpitations and leg swelling.  Gastrointestinal:  Negative for abdominal pain.  Endocrine: Negative for polydipsia.  Skin:  Negative for rash.  Neurological:  Negative  for dizziness, weakness and headaches.  Hematological:  Does not bruise/bleed easily.  All other systems reviewed and are negative.     Objective:   Physical Exam Vitals and nursing note reviewed.  Constitutional:      General: She is not in acute distress.    Appearance: Normal appearance. She is well-developed.  HENT:     Head: Normocephalic.     Right Ear: Tympanic membrane normal.     Left Ear: Tympanic membrane normal.     Nose: Nose normal.     Mouth/Throat:     Mouth: Mucous membranes are moist.  Eyes:     Pupils: Pupils are equal, round, and reactive to light.  Neck:     Vascular: No carotid bruit or JVD.  Cardiovascular:     Rate and Rhythm: Normal rate and regular rhythm.     Heart sounds: Normal heart sounds.  Pulmonary:     Effort: Pulmonary effort is normal. No respiratory distress.     Breath sounds: Normal breath sounds. No wheezing or rales.  Chest:     Chest wall: No tenderness.  Abdominal:     General: Bowel sounds are normal. There is no distension or abdominal bruit.     Palpations: Abdomen is soft. There is no hepatomegaly, splenomegaly, mass or pulsatile mass.     Tenderness: There is no abdominal tenderness.  Musculoskeletal:        General: Normal range of motion.     Cervical back: Normal range of motion and neck supple.  Lymphadenopathy:     Cervical: No cervical adenopathy.  Skin:    General: Skin is warm and dry.  Neurological:     Mental Status: She is alert and oriented to person, place, and time.     Deep Tendon Reflexes: Reflexes are normal and symmetric.  Psychiatric:        Behavior: Behavior normal.        Thought Content: Thought content normal.        Judgment: Judgment normal.     BP 122/70   Pulse 71   Temp 98 F (36.7 C) (Temporal)   Resp 20   Ht _0  (1.702 m)   Wt 145 lb (65.8 kg)   SpO2 96%   BMI 22.71 kg/m        Assessment & Plan:  Alejandra Mcdonald comes in today with chief complaint of Annual  Exam   Diagnosis and orders addressed:  1. Annual physical exam - CBC with Differential/Platelet - Cytology - PAP - Urinalysis, Complete  2. Type 1 diabetes mellitus with target hemoglobin A1c of less than 7.5 percent (HCC) Continue to watch carbs in diet - Bayer DCA Hb A1c Waived  3. Acquired hypothyroidism Labs pending - Thyroid Panel With TSH  4. Hyperlipidemia with target LDL less than 100 Low fat diet - CMP14+EGFR - Lipid panel   Labs pending Health Maintenance reviewed Diet and exercise encouraged  Follow up plan: 3 months   [place], FNP

## 2021-06-06 NOTE — Patient Instructions (Signed)
Exercising to Stay Healthy °To become healthy and stay healthy, it is recommended that you do moderate-intensity and vigorous-intensity exercise. You can tell that you are exercising at a moderate intensity if your heart starts beating faster and you start breathing faster but can still hold a conversation. You can tell that you are exercising at a vigorous intensity if you are breathing much harder and faster and cannot hold a conversation while exercising. °How can exercise benefit me? °Exercising regularly is important. It has many health benefits, such as: °Improving overall fitness, flexibility, and endurance. °Increasing bone density. °Helping with weight control. °Decreasing body fat. °Increasing muscle strength and endurance. °Reducing stress and tension, anxiety, depression, or anger. °Improving overall health. °What guidelines should I follow while exercising? °Before you start a new exercise program, talk with your health care provider. °Do not exercise so much that you hurt yourself, feel dizzy, or get very short of breath. °Wear comfortable clothes and wear shoes with good support. °Drink plenty of water while you exercise to prevent dehydration or heat stroke. °Work out until your breathing and your heartbeat get faster (moderate intensity). °How often should I exercise? °Choose an activity that you enjoy, and set realistic goals. Your health care provider can help you make an activity plan that is individually designed and works best for you. °Exercise regularly as told by your health care provider. This may include: °Doing strength training two times a week, such as: °Lifting weights. °Using resistance bands. °Push-ups. °Sit-ups. °Yoga. °Doing a certain intensity of exercise for a given amount of time. Choose from these options: °A total of 150 minutes of moderate-intensity exercise every week. °A total of 75 minutes of vigorous-intensity exercise every week. °A mix of moderate-intensity and  vigorous-intensity exercise every week. °Children, pregnant women, people who have not exercised regularly, people who are overweight, and older adults may need to talk with a health care provider about what activities are safe to perform. If you have a medical condition, be sure to talk with your health care provider before you start a new exercise program. °What are some exercise ideas? °Moderate-intensity exercise ideas include: °Walking 1 mile (1.6 km) in about 15 minutes. °Biking. °Hiking. °Golfing. °Dancing. °Water aerobics. °Vigorous-intensity exercise ideas include: °Walking 4.5 miles (7.2 km) or more in about 1 hour. °Jogging or running 5 miles (8 km) in about 1 hour. °Biking 10 miles (16.1 km) or more in about 1 hour. °Lap swimming. °Roller-skating or in-line skating. °Cross-country skiing. °Vigorous competitive sports, such as football, basketball, and soccer. °Jumping rope. °Aerobic dancing. °What are some everyday activities that can help me get exercise? °Yard work, such as: °Pushing a lawn mower. °Raking and bagging leaves. °Washing your car. °Pushing a stroller. °Shoveling snow. °Gardening. °Washing windows or floors. °How can I be more active in my day-to-day activities? °Use stairs instead of an elevator. °Take a walk during your lunch break. °If you drive, park your car farther away from your work or school. °If you take public transportation, get off one stop early and walk the rest of the way. °Stand up or walk around during all of your indoor phone calls. °Get up, stretch, and walk around every 30 minutes throughout the day. °Enjoy exercise with a friend. Support to continue exercising will help you keep a regular routine of activity. °Where to find more information °You can find more information about exercising to stay healthy from: °[place] Department of Health and Human Services: www.hhs.gov °Centers for Disease Control and Prevention (  CDC): www.cdc.gov °Summary °Exercising regularly is  important. It will improve your overall fitness, flexibility, and endurance. °Regular exercise will also improve your overall health. It can help you control your weight, reduce stress, and improve your bone density. °Do not exercise so much that you hurt yourself, feel dizzy, or get very short of breath. °Before you start a new exercise program, talk with your health care provider. °This information is not intended to replace advice given to you by your health care provider. Make sure you discuss any questions you have with your health care provider. °Document Revised: 11/29/2020 Document Reviewed: 11/29/2020 °Elsevier Patient Education © 2022 Elsevier Inc. ° °

## 2021-06-06 NOTE — Addendum Note (Signed)
Addended by: Rolena Infante on: 06/06/2021 04:56 PM   Modules accepted: Orders

## 2021-06-07 LAB — CMP14+EGFR
ALT: 28 IU/L (ref 0–32)
AST: 29 IU/L (ref 0–40)
Albumin/Globulin Ratio: 2.3 — ABNORMAL HIGH (ref 1.2–2.2)
Albumin: 4.5 g/dL (ref 3.8–4.8)
Alkaline Phosphatase: 78 IU/L (ref 44–121)
BUN/Creatinine Ratio: 12 (ref 12–28)
BUN: 9 mg/dL (ref 8–27)
Bilirubin Total: 0.3 mg/dL (ref 0.0–1.2)
CO2: 26 mmol/L (ref 20–29)
Calcium: 10.1 mg/dL (ref 8.7–10.3)
Chloride: 98 mmol/L (ref 96–106)
Creatinine, Ser: 0.77 mg/dL (ref 0.57–1.00)
Globulin, Total: 2 g/dL (ref 1.5–4.5)
Glucose: 99 mg/dL (ref 70–99)
Potassium: 4.5 mmol/L (ref 3.5–5.2)
Sodium: 139 mmol/L (ref 134–144)
Total Protein: 6.5 g/dL (ref 6.0–8.5)
eGFR: 88 mL/min/{1.73_m2} (ref >59)

## 2021-06-07 LAB — CBC WITH DIFFERENTIAL/PLATELET
Basophils Absolute: 0.1 10*3/uL (ref 0.0–0.2)
Basos: 1 %
EOS (ABSOLUTE): 0.2 10*3/uL (ref 0.0–0.4)
Eos: 3 %
Hematocrit: 42.9 % (ref 34.0–46.6)
Hemoglobin: 14.7 g/dL (ref 11.1–15.9)
Immature Grans (Abs): 0 10*3/uL (ref 0.0–0.1)
Immature Granulocytes: 0 %
Lymphocytes Absolute: 2 10*3/uL (ref 0.7–3.1)
Lymphs: 34 %
MCH: 31.3 pg (ref 26.6–33.0)
MCHC: 34.3 g/dL (ref 31.5–35.7)
MCV: 92 fL (ref 79–97)
Monocytes Absolute: 0.4 10*3/uL (ref 0.1–0.9)
Monocytes: 7 %
Neutrophils Absolute: 3.3 10*3/uL (ref 1.4–7.0)
Neutrophils: 55 %
Platelets: 336 10*3/uL (ref 150–450)
RBC: 4.69 x10E6/uL (ref 3.77–5.28)
RDW: 11.9 % (ref 11.7–15.4)
WBC: 6 10*3/uL (ref 3.4–10.8)

## 2021-06-07 LAB — LIPID PANEL
Chol/HDL Ratio: 2.3 ratio (ref 0.0–4.4)
Cholesterol, Total: 217 mg/dL — ABNORMAL HIGH (ref 100–199)
HDL: 93 mg/dL (ref >39)
LDL Chol Calc (NIH): 110 mg/dL — ABNORMAL HIGH (ref 0–99)
Triglycerides: 79 mg/dL (ref 0–149)
VLDL Cholesterol Cal: 14 mg/dL (ref 5–40)

## 2021-06-07 LAB — THYROID PANEL WITH TSH
Free Thyroxine Index: 1.8 (ref 1.2–4.9)
T3 Uptake Ratio: 25 % (ref 24–39)
T4, Total: 7.1 ug/dL (ref 4.5–12.0)
TSH: 5.42 u[IU]/mL — ABNORMAL HIGH (ref 0.450–4.500)

## 2021-06-11 LAB — CYTOLOGY - PAP
Comment: NEGATIVE
Diagnosis: NEGATIVE
High risk HPV: NEGATIVE

## 2021-06-30 DIAGNOSIS — E109 Type 1 diabetes mellitus without complications: Secondary | ICD-10-CM | POA: Diagnosis not present

## 2021-06-30 DIAGNOSIS — Z794 Long term (current) use of insulin: Secondary | ICD-10-CM | POA: Diagnosis not present

## 2021-07-15 ENCOUNTER — Telehealth: Payer: Self-pay | Admitting: Nurse Practitioner

## 2021-07-15 DIAGNOSIS — E109 Type 1 diabetes mellitus without complications: Secondary | ICD-10-CM

## 2021-07-15 DIAGNOSIS — E785 Hyperlipidemia, unspecified: Secondary | ICD-10-CM

## 2021-07-15 DIAGNOSIS — E039 Hypothyroidism, unspecified: Secondary | ICD-10-CM

## 2021-07-15 NOTE — Telephone Encounter (Signed)
Pt would like to come in and get labs on 12/9. Please call when orders are in. She is already added to the schedule.

## 2021-07-15 NOTE — Telephone Encounter (Signed)
Patient aware and verbalized understanding. °

## 2021-07-15 NOTE — Telephone Encounter (Signed)
Orders have been placed.

## 2021-07-23 DIAGNOSIS — Z794 Long term (current) use of insulin: Secondary | ICD-10-CM | POA: Diagnosis not present

## 2021-07-23 DIAGNOSIS — E109 Type 1 diabetes mellitus without complications: Secondary | ICD-10-CM | POA: Diagnosis not present

## 2021-07-25 ENCOUNTER — Other Ambulatory Visit: Payer: BC Managed Care – PPO

## 2021-07-25 DIAGNOSIS — E785 Hyperlipidemia, unspecified: Secondary | ICD-10-CM | POA: Diagnosis not present

## 2021-07-25 DIAGNOSIS — E109 Type 1 diabetes mellitus without complications: Secondary | ICD-10-CM

## 2021-07-25 DIAGNOSIS — E039 Hypothyroidism, unspecified: Secondary | ICD-10-CM | POA: Diagnosis not present

## 2021-07-25 LAB — BAYER DCA HB A1C WAIVED: HB A1C (BAYER DCA - WAIVED): 7.1 % — ABNORMAL HIGH (ref 4.8–5.6)

## 2021-07-26 LAB — LIPID PANEL
Chol/HDL Ratio: 2 ratio (ref 0.0–4.4)
Cholesterol, Total: 207 mg/dL — ABNORMAL HIGH (ref 100–199)
HDL: 103 mg/dL (ref >39)
LDL Chol Calc (NIH): 97 mg/dL (ref 0–99)
Triglycerides: 33 mg/dL (ref 0–149)
VLDL Cholesterol Cal: 7 mg/dL (ref 5–40)

## 2021-07-26 LAB — CMP14+EGFR
ALT: 23 IU/L (ref 0–32)
AST: 24 IU/L (ref 0–40)
Albumin/Globulin Ratio: 2.4 — ABNORMAL HIGH (ref 1.2–2.2)
Albumin: 4.4 g/dL (ref 3.8–4.8)
Alkaline Phosphatase: 70 IU/L (ref 44–121)
BUN/Creatinine Ratio: 13 (ref 12–28)
BUN: 11 mg/dL (ref 8–27)
Bilirubin Total: 0.3 mg/dL (ref 0.0–1.2)
CO2: 28 mmol/L (ref 20–29)
Calcium: 9.7 mg/dL (ref 8.7–10.3)
Chloride: 99 mmol/L (ref 96–106)
Creatinine, Ser: 0.84 mg/dL (ref 0.57–1.00)
Globulin, Total: 1.8 g/dL (ref 1.5–4.5)
Glucose: 115 mg/dL — ABNORMAL HIGH (ref 70–99)
Potassium: 4.1 mmol/L (ref 3.5–5.2)
Sodium: 142 mmol/L (ref 134–144)
Total Protein: 6.2 g/dL (ref 6.0–8.5)
eGFR: 79 mL/min/{1.73_m2} (ref >59)

## 2021-07-26 LAB — CBC WITH DIFFERENTIAL/PLATELET
Basophils Absolute: 0.1 10*3/uL (ref 0.0–0.2)
Basos: 1 %
EOS (ABSOLUTE): 0.1 10*3/uL (ref 0.0–0.4)
Eos: 2 %
Hematocrit: 41.2 % (ref 34.0–46.6)
Hemoglobin: 14.3 g/dL (ref 11.1–15.9)
Immature Grans (Abs): 0 10*3/uL (ref 0.0–0.1)
Immature Granulocytes: 0 %
Lymphocytes Absolute: 1.7 10*3/uL (ref 0.7–3.1)
Lymphs: 29 %
MCH: 31.7 pg (ref 26.6–33.0)
MCHC: 34.7 g/dL (ref 31.5–35.7)
MCV: 91 fL (ref 79–97)
Monocytes Absolute: 0.5 10*3/uL (ref 0.1–0.9)
Monocytes: 8 %
Neutrophils Absolute: 3.5 10*3/uL (ref 1.4–7.0)
Neutrophils: 60 %
Platelets: 298 10*3/uL (ref 150–450)
RBC: 4.51 x10E6/uL (ref 3.77–5.28)
RDW: 12.2 % (ref 11.7–15.4)
WBC: 5.9 10*3/uL (ref 3.4–10.8)

## 2021-07-26 LAB — THYROID PANEL WITH TSH
Free Thyroxine Index: 2.6 (ref 1.2–4.9)
T3 Uptake Ratio: 29 % (ref 24–39)
T4, Total: 9.1 ug/dL (ref 4.5–12.0)
TSH: 0.947 u[IU]/mL (ref 0.450–4.500)

## 2021-08-23 DIAGNOSIS — Z794 Long term (current) use of insulin: Secondary | ICD-10-CM | POA: Diagnosis not present

## 2021-08-23 DIAGNOSIS — E109 Type 1 diabetes mellitus without complications: Secondary | ICD-10-CM | POA: Diagnosis not present

## 2021-09-05 ENCOUNTER — Encounter: Payer: Self-pay | Admitting: Nurse Practitioner

## 2021-09-05 ENCOUNTER — Ambulatory Visit: Payer: BC Managed Care – PPO | Admitting: Nurse Practitioner

## 2021-09-05 VITALS — BP 133/76 | HR 70 | Temp 98.0°F | Resp 20 | Ht 67.0 in | Wt 152.0 lb

## 2021-09-05 DIAGNOSIS — E039 Hypothyroidism, unspecified: Secondary | ICD-10-CM

## 2021-09-05 DIAGNOSIS — E785 Hyperlipidemia, unspecified: Secondary | ICD-10-CM | POA: Diagnosis not present

## 2021-09-05 DIAGNOSIS — E109 Type 1 diabetes mellitus without complications: Secondary | ICD-10-CM

## 2021-09-05 DIAGNOSIS — K5904 Chronic idiopathic constipation: Secondary | ICD-10-CM

## 2021-09-05 MED ORDER — INSULIN ASPART 100 UNIT/ML IJ SOLN: INTRAMUSCULAR | 99 refills | Status: DC

## 2021-09-05 MED ORDER — SIMVASTATIN 40 MG PO TABS: 40.0000 mg | ORAL_TABLET | Freq: Every day | ORAL | 1 refills | Status: DC

## 2021-09-05 MED ORDER — FREESTYLE LIBRE 14 DAY SENSOR MISC: 1.0000 | 5 refills | Status: DC

## 2021-09-05 MED ORDER — LEVOTHYROXINE SODIUM 125 MCG PO TABS: ORAL_TABLET | ORAL | 1 refills | Status: DC

## 2021-09-05 NOTE — Progress Notes (Signed)
Subjective:    Patient ID: Alejandra Mcdonald, female    DOB: [birth year], 61 y.o.   MRN: [id number]   Chief Complaint: medical management of chronic issues     HPI:  Alejandra Mcdonald is a 61 y.o. who identifies as a female who was assigned female at birth.   Social history: Lives with: husband   Comes in today for follow up of the following chronic medical issues:  1. Hyperlipidemia with target LDL less than 100 Does try to watch diet and stays very active. Lab Results  Component Value Date   CHOL 207 (H) 07/25/2021   HDL 103 07/25/2021   LDLCALC 97 07/25/2021   TRIG 33 07/25/2021   CHOLHDL 2.0 07/25/2021     2. Acquired hypothyroidism No issues that he is aware of. Lab Results  Component Value Date   TSH 0.947 07/25/2021      3. Type 1 diabetes mellitus with target hemoglobin A1c of less than 7.5 percent ([employer]) Checks  her blood sugars frequently. Has been on insulin pump for years. Blood sugars fasting ususlly stay around 130-140. But throughout the day they go up and down. Lab Results  Component Value Date   HGBA1C 7.1 (H) 07/25/2021     4. Chronic idiopathic constipation This has been an issue with her for years. Uses OTC meds when she needs to.   New complaints: None today  Allergies  Allergen Reactions   Ace Inhibitors Cough   Baclofen Other (See Comments)    Raises blood sugar   Codeine Nausea And Vomiting   Lipitor [Atorvastatin] Other (See Comments)    Myalgias   Outpatient Encounter Medications as of 09/05/2021  Medication Sig   albuterol (VENTOLIN HFA) 108 (90 Base) MCG/ACT inhaler Inhale 2 puffs into the lungs every 6 (six) hours as needed for wheezing or shortness of breath.   aspirin EC 81 MG tablet Take 81 mg by mouth daily. Swallow whole.   Calcium Citrate-Vitamin D (CALCIUM + D PO) Take 1 tablet by mouth daily.    Continuous Blood Gluc Receiver (FREESTYLE LIBRE 14 DAY READER) DEVI 1 Device by Does not apply route daily.   Continuous Blood  Gluc Sensor (FREESTYLE LIBRE 14 DAY SENSOR) MISC 1 Device by Does not apply route every 14 (fourteen) days.   Insulin Human (INSULIN PUMP) SOLN Inject into the skin See admin instructions. Novolog Insulin USE UP TO 100 UNITS VIA PUMP PER DAY   levothyroxine (SYNTHROID) 125 MCG tablet TAKE 1 TABLET(125 MCG) BY MOUTH DAILY   magnesium hydroxide (MILK OF MAGNESIA) 400 MG/5ML suspension Take 30 mLs by mouth once a week.   Multiple Vitamin (MULTIVITAMIN WITH MINERALS) TABS tablet Take 1 tablet by mouth in the morning and at bedtime.   NOVOLOG 100 UNIT/ML injection ADMINISTER UP TO 100 UNITS VIA PUMP DAILY (Needs to be seen before next refill)   simvastatin (ZOCOR) 40 MG tablet Take 1 tablet (40 mg total) by mouth daily at 6 PM. (Needs to be seen before next refill)   No facility-administered encounter medications on file as of 09/05/2021.    Past Surgical History:  Procedure Laterality Date   ANTERIOR CERVICAL DECOMPRESSION/DISCECTOMY FUSION 4 LEVELS N/A 04/24/2020   Procedure: ANTERIOR CERVICAL DECOMPRESSION FUSION CERVICAL 4-5, CERVICAL 5-6, CERVICAL 6-7 WITH INSTRUMENTATION AND ALLOGRAFT;  Surgeon: Phylliss Bob, MD;  Location: [facility];  Service: Orthopedics;  Laterality: N/A;   APPENDECTOMY     BACK SURGERY  03/2015   BILATERAL CARPAL TUNNEL RELEASE  BIOPSY  03/22/2019   Procedure: BIOPSY;  Surgeon: Rogene Houston, MD;  [facility];  Service: Endoscopy;;  ascending colon polyp biopsy   CATARACT EXTRACTION Bilateral may & Alejandra 2017   CESAREAN SECTION     COLONOSCOPY N/A 03/22/2019   Procedure: COLONOSCOPY;  Surgeon: Rogene Houston, MD;  [facility];  Service: Endoscopy;  Laterality: N/A;  930   POLYPECTOMY  03/22/2019   Procedure: POLYPECTOMY;  Surgeon: Rogene Houston, MD;  [facility];  Service: Endoscopy;;  ascending colon, hepatic flexure   TUBAL LIGATION      Family History  Problem Relation Age of Onset   Hypertension Mother    Diabetes Mother     Hypertension Father    Healthy Brother    Healthy Brother    Healthy Brother    Breast cancer Neg Hx       Controlled substance contract: n/a     Review of Systems  Constitutional:  Negative for diaphoresis.  Eyes:  Negative for pain.  Respiratory:  Negative for shortness of breath.   Cardiovascular:  Negative for chest pain, palpitations and leg swelling.  Gastrointestinal:  Negative for abdominal pain.  Endocrine: Negative for polydipsia.  Skin:  Negative for rash.  Neurological:  Negative for dizziness, weakness and headaches.  Hematological:  Does not bruise/bleed easily.  All other systems reviewed and are negative.     Objective:   Physical Exam Vitals and nursing note reviewed.  Constitutional:      General: She is not in acute distress.    Appearance: Normal appearance. She is well-developed.  HENT:     Head: Normocephalic.     Right Ear: Tympanic membrane normal.     Left Ear: Tympanic membrane normal.     Nose: Nose normal.     Mouth/Throat:     Mouth: Mucous membranes are moist.  Eyes:     Pupils: Pupils are equal, round, and reactive to light.  Neck:     Vascular: No carotid bruit or JVD.  Cardiovascular:     Rate and Rhythm: Normal rate and regular rhythm.     Heart sounds: Normal heart sounds.  Pulmonary:     Effort: Pulmonary effort is normal. No respiratory distress.     Breath sounds: Normal breath sounds. No wheezing or rales.  Chest:     Chest wall: No tenderness.  Abdominal:     General: Bowel sounds are normal. There is no distension or abdominal bruit.     Palpations: Abdomen is soft. There is no hepatomegaly, splenomegaly, mass or pulsatile mass.     Tenderness: There is no abdominal tenderness.  Musculoskeletal:        General: Normal range of motion.     Cervical back: Normal range of motion and neck supple.  Lymphadenopathy:     Cervical: No cervical adenopathy.  Skin:    General: Skin is warm and dry.  Neurological:     Mental  Status: She is alert and oriented to person, place, and time.     Deep Tendon Reflexes: Reflexes are normal and symmetric.  Psychiatric:        Behavior: Behavior normal.        Thought Content: Thought content normal.        Judgment: Judgment normal.    BP 133/76    Pulse 70    Temp 98 F (36.7 C) (Temporal)    Resp 20    Ht 5'  7" (1.702 m)    Wt 152 lb (68.9 kg)    SpO2 99%    BMI 23.81 kg/m        Assessment & Plan:   Callista L Plocher comes in today with chief complaint of Medical Management of Chronic Issues   Diagnosis and orders addressed:  1. Hyperlipidemia with target LDL less than 100 Low fat diet - simvastatin (ZOCOR) 40 MG tablet; Take 1 tablet (40 mg total) by mouth daily at 6 PM. (Needs to be seen before next refill)  Dispense: 90 tablet; Refill: 1  2. Acquired hypothyroidism Labs pending - levothyroxine (SYNTHROID) 125 MCG tablet; TAKE 1 TABLET(125 MCG) BY MOUTH DAILY  Dispense: 90 tablet; Refill: 1  3. Type 1 diabetes mellitus with target hemoglobin A1c of less than 7.5 percent (HCC) Continue to watch carbs in diet - insulin aspart (NOVOLOG) 100 UNIT/ML injection; Per pump  Dispense: 30 mL; Refill: PRN  4. Chronic idiopathic constipation Continue to treat as needed  Health Maintenance reviewed Diet and exercise encouraged  Follow up plan: 3 months   Mary-Margaret Hassell Done, FNP

## 2021-09-05 NOTE — Patient Instructions (Signed)

## 2021-09-05 NOTE — Addendum Note (Signed)
Addended by: Chevis Pretty on: 09/05/2021 03:15 PM   Modules accepted: Orders

## 2021-09-10 ENCOUNTER — Ambulatory Visit: Payer: BC Managed Care – PPO | Admitting: Nurse Practitioner

## 2021-09-10 ENCOUNTER — Other Ambulatory Visit: Payer: Self-pay

## 2021-09-10 ENCOUNTER — Telehealth: Payer: Self-pay | Admitting: Nurse Practitioner

## 2021-09-10 ENCOUNTER — Encounter: Payer: Self-pay | Admitting: Nurse Practitioner

## 2021-09-10 DIAGNOSIS — J4521 Mild intermittent asthma with (acute) exacerbation: Secondary | ICD-10-CM

## 2021-09-10 MED ORDER — BENZONATATE 100 MG PO CAPS: 100.0000 mg | ORAL_CAPSULE | Freq: Three times a day (TID) | ORAL | 0 refills | Status: DC | PRN

## 2021-09-10 MED ORDER — AZITHROMYCIN 250 MG PO TABS: ORAL_TABLET | ORAL | 0 refills | Status: DC

## 2021-09-10 MED ORDER — PREDNISONE 20 MG PO TABS: 40.0000 mg | ORAL_TABLET | Freq: Every day | ORAL | 0 refills | Status: AC

## 2021-09-10 MED ORDER — PREDNISONE 20 MG PO TABS: 40.0000 mg | ORAL_TABLET | Freq: Every day | ORAL | 0 refills | Status: DC

## 2021-09-10 NOTE — Addendum Note (Signed)
Addended by: Chevis Pretty on: 09/10/2021 09:55 AM   Modules accepted: Orders

## 2021-09-10 NOTE — Patient Instructions (Signed)

## 2021-09-10 NOTE — Telephone Encounter (Signed)
done

## 2021-09-10 NOTE — Progress Notes (Signed)
Virtual Visit  Note Due to COVID-19 pandemic this visit was conducted virtually. This visit type was conducted due to national recommendations for restrictions regarding the COVID-19 Pandemic (e.g. social distancing, sheltering in place) in an effort to limit this patient's exposure and mitigate transmission in our community. All issues noted in this document were discussed and addressed.  A physical exam was not performed with this format.  I connected with Alejandra Mcdonald on 09/10/21 at 8:13 by telephone and verified that I am speaking with the correct person using two identifiers. Alejandra Mcdonald is currently located at home and her husband is currently with her during visit. The provider, Mary-Margaret Hassell Done, FNP is located in their office at time of visit.  I discussed the limitations, risks, security and privacy concerns of performing an evaluation and management service by telephone and the availability of in person appointments. I also discussed with the patient that there may be a patient responsible charge related to this service. The patient expressed understanding and agreed to proceed.   History and Present Illness:  Cough This is a new problem. The current episode started 1 to 4 weeks ago. The problem has been gradually worsening. The problem occurs constantly. The cough is Productive of sputum. Associated symptoms include ear congestion, nasal congestion, postnasal drip and rhinorrhea. Pertinent negatives include no chills, fever, sore throat or shortness of breath. Nothing aggravates the symptoms. She has tried OTC cough suppressant for the symptoms. The treatment provided mild relief. Her past medical history is significant for asthma.     Review of Systems  Constitutional:  Negative for chills and fever.  HENT:  Positive for postnasal drip and rhinorrhea. Negative for sore throat.   Respiratory:  Positive for cough. Negative for shortness of breath.      Observations/Objective: Alert and oriented- answers all questions appropriately No distress Voice raspy Cough constant during visit  Assessment and Plan: [name] in today with chief complaint of Cough   1. Mild intermittent asthmatic bronchitis with acute exacerbation 1. Take meds as prescribed 2. Use a cool mist humidifier especially during the winter months and when heat has been humid. 3. Use saline nose sprays frequently 4. Saline irrigations of the nose can be very helpful if done frequently.  * 4X daily for 1 week*  * Use of a nettie pot can be helpful with this. Follow directions with this* 5. Drink plenty of fluids 6. Keep thermostat turn down low 7.For any cough or congestion- tessalon perles 8. For fever or aces or pains- take tylenol or ibuprofen appropriate for age and weight.  * for fevers greater than 101 orally you may alternate ibuprofen and tylenol every  3 hours.   Meds ordered this encounter  Medications   azithromycin (ZITHROMAX Z-PAK) 250 MG tablet    Sig: As directed    Dispense:  6 tablet    Refill:  0    Order Specific Question:   Supervising Provider    Answer:   Caryl Pina A [[id number]]   benzonatate (TESSALON PERLES) 100 MG capsule    Sig: Take 1 capsule (100 mg total) by mouth 3 (three) times daily as needed for cough.    Dispense:  20 capsule    Refill:  0    Order Specific Question:   Supervising Provider    Answer:   Caryl Pina A [[id number]]   predniSONE (DELTASONE) 20 MG tablet    Sig: Take 2 tablets (40 mg total) by  mouth daily with breakfast for 5 days. 2 po daily for 5 days    Dispense:  10 tablet    Refill:  0    Order Specific Question:   Supervising Provider    Answer:   Caryl Pina A [[id number]]      Follow Up Instructions: prn    I discussed the assessment and treatment plan with the patient. The patient was provided an opportunity to ask questions and all were answered. The patient agreed with the  plan and demonstrated an understanding of the instructions.   The patient was advised to call back or seek an in-person evaluation if the symptoms worsen or if the condition fails to improve as anticipated.  The above assessment and management plan was discussed with the patient. The patient verbalized understanding of and has agreed to the management plan. Patient is aware to call the clinic if symptoms persist or worsen. Patient is aware when to return to the clinic for a follow-up visit. Patient educated on when it is appropriate to go to the emergency department.   Time call ended:  8:25  I provided 12 minutes of  non face-to-face time during this encounter.    Mary-Margaret Hassell Done, FNP

## 2021-09-11 ENCOUNTER — Telehealth: Payer: Self-pay | Admitting: Nurse Practitioner

## 2021-09-11 ENCOUNTER — Other Ambulatory Visit: Payer: Self-pay

## 2021-09-11 DIAGNOSIS — U071 COVID-19: Secondary | ICD-10-CM

## 2021-09-11 MED ORDER — ALBUTEROL SULFATE HFA 108 (90 BASE) MCG/ACT IN AERS: 2.0000 | INHALATION_SPRAY | Freq: Four times a day (QID) | RESPIRATORY_TRACT | 0 refills | Status: DC | PRN

## 2021-09-11 NOTE — Telephone Encounter (Signed)
Pt calling about having a breathing treatment done. She said that she has had them done here before and would like to come in to have one because she is still having issues with her breathing after her appt with MMM. Please call back and advise.

## 2021-09-11 NOTE — Telephone Encounter (Signed)
Patient notified that we are unable to perform breathing treatments at this time due to Covid. Patient verbalized understanding. Sent in a refill on albuterol to [facility] per patients request

## 2021-09-23 DIAGNOSIS — E109 Type 1 diabetes mellitus without complications: Secondary | ICD-10-CM | POA: Diagnosis not present

## 2021-09-23 DIAGNOSIS — Z794 Long term (current) use of insulin: Secondary | ICD-10-CM | POA: Diagnosis not present

## 2021-10-03 DIAGNOSIS — M5412 Radiculopathy, cervical region: Secondary | ICD-10-CM | POA: Diagnosis not present

## 2021-10-06 ENCOUNTER — Other Ambulatory Visit (HOSPITAL_COMMUNITY): Payer: Self-pay | Admitting: Orthopedic Surgery

## 2021-10-06 DIAGNOSIS — M5412 Radiculopathy, cervical region: Secondary | ICD-10-CM

## 2021-10-17 ENCOUNTER — Ambulatory Visit (HOSPITAL_COMMUNITY)
Admission: RE | Admit: 2021-10-17 | Discharge: 2021-10-17 | Disposition: A | Discharge status: Home / Self Care | Payer: BC Managed Care – PPO | Source: Ambulatory Visit | Attending: Orthopedic Surgery | Admitting: Orthopedic Surgery

## 2021-10-17 ENCOUNTER — Other Ambulatory Visit: Payer: Self-pay

## 2021-10-17 DIAGNOSIS — M5412 Radiculopathy, cervical region: Secondary | ICD-10-CM | POA: Insufficient documentation

## 2021-10-17 DIAGNOSIS — M542 Cervicalgia: Secondary | ICD-10-CM | POA: Diagnosis not present

## 2021-11-06 ENCOUNTER — Other Ambulatory Visit: Payer: Self-pay

## 2021-11-06 ENCOUNTER — Ambulatory Visit (HOSPITAL_COMMUNITY)
Admission: RE | Admit: 2021-11-06 | Discharge: 2021-11-06 | Disposition: A | Discharge status: Home / Self Care | Payer: BC Managed Care – PPO | Source: Ambulatory Visit | Attending: Orthopedic Surgery | Admitting: Orthopedic Surgery

## 2021-11-06 DIAGNOSIS — M5412 Radiculopathy, cervical region: Secondary | ICD-10-CM | POA: Insufficient documentation

## 2021-11-11 ENCOUNTER — Encounter: Payer: Self-pay | Admitting: Nurse Practitioner

## 2021-11-11 ENCOUNTER — Ambulatory Visit: Payer: BC Managed Care – PPO | Admitting: Nurse Practitioner

## 2021-11-11 VITALS — BP 137/68 | HR 69 | Temp 96.9°F | Ht 67.0 in | Wt 155.6 lb

## 2021-11-11 DIAGNOSIS — R42 Dizziness and giddiness: Secondary | ICD-10-CM | POA: Diagnosis not present

## 2021-11-11 DIAGNOSIS — H6501 Acute serous otitis media, right ear: Secondary | ICD-10-CM | POA: Diagnosis not present

## 2021-11-11 MED ORDER — MECLIZINE HCL 25 MG PO TABS: 25.0000 mg | ORAL_TABLET | Freq: Three times a day (TID) | ORAL | 0 refills | Status: DC | PRN

## 2021-11-11 MED ORDER — FLUTICASONE PROPIONATE 50 MCG/ACT NA SUSP: 2.0000 | Freq: Every day | NASAL | 6 refills | Status: DC

## 2021-11-11 MED ORDER — AMOXICILLIN 875 MG PO TABS: 875.0000 mg | ORAL_TABLET | Freq: Two times a day (BID) | ORAL | 0 refills | Status: DC

## 2021-11-11 NOTE — Progress Notes (Signed)
? ?  Subjective:  ? ? Patient ID: Alejandra Mcdonald, female    DOB: 05/16/1961, 61 y.o.   MRN: [id number] ? ? ?Chief Complaint: ear stopped up ? ? ?HPI ?Patient come sin  today c/o feeling like right ear is stopped up. She has had several spells of vertigo. She has not tanking any meds at home.  ? ? ? ?Review of Systems  ?HENT:  Positive for congestion (mild) and ear pain. Negative for ear discharge, sinus pressure and sinus pain.   ?Respiratory:  Positive for cough (slight- is better).   ?Musculoskeletal: Negative.   ?Neurological: Negative.   ?All other systems reviewed and are negative. ? ?   ?Objective:  ? Physical Exam ?Vitals reviewed.  ?Constitutional:   ?   Appearance: Normal appearance.  ?HENT:  ?   Right Ear: There is no impacted cerumen. Tympanic membrane is erythematous and bulging. Tympanic membrane has decreased mobility.  ?   Left Ear: Tympanic membrane normal. There is no impacted cerumen.  ?Cardiovascular:  ?   Rate and Rhythm: Normal rate and regular rhythm.  ?   Heart sounds: Normal heart sounds.  ?Pulmonary:  ?   Breath sounds: Normal breath sounds.  ?Skin: ?   General: Skin is warm.  ?Neurological:  ?   General: No focal deficit present.  ?   Mental Status: She is alert.  ?Psychiatric:     ?   Mood and Affect: Mood normal.     ?   Behavior: Behavior normal.  ? ?BP 137/68   Pulse 69   Temp (!) 96.9 ?F (36.1 ?C) (Oral)   Ht '5\' 7"'$  (1.702 m)   Wt 155 lb 9.6 oz (70.6 kg)   SpO2 99%   BMI 24.37 kg/m?  ? ? ? ? ?   ?Assessment & Plan:  ? ?Libra L Redondo in today with chief complaint of ear stopped up ? ? ?1. Non-recurrent acute serous otitis media of right ear ?Force fluids ?- amoxicillin (AMOXIL) 875 MG tablet; Take 1 tablet (875 mg total) by mouth 2 (two) times daily. 1 po BID  Dispense: 20 tablet; Refill: 0 ?- fluticasone (FLONASE) 50 MCG/ACT nasal spray; Place 2 sprays into both nostrils daily.  Dispense: 16 g; Refill: 6 ? ?2. Vertigo ?Rest ?Fall prevention ?- meclizine (ANTIVERT) 25 MG tablet; Take 1  tablet (25 mg total) by mouth 3 (three) times daily as needed for dizziness.  Dispense: 30 tablet; Refill: 0 ? ? ? ?The above assessment and management plan was discussed with the patient. The patient verbalized understanding of and has agreed to the management plan. Patient is aware to call the clinic if symptoms persist or worsen. Patient is aware when to return to the clinic for a follow-up visit. Patient educated on when it is appropriate to go to the emergency department.  ? ?Mary-Margaret Hassell Done, FNP ? ? ?

## 2021-11-11 NOTE — Patient Instructions (Signed)
Vertigo Vertigo is the feeling that you or the things around you are moving when they are not. This feeling can come and go at any time. Vertigo often goes away on its own. This condition can be dangerous if it happens when you are doingactivities like driving or working with machines. Your doctor will do tests to find the cause of your vertigo. These tests willalso help your doctor decide on the best treatment for you. Follow these instructions at home: Eating and drinking     Drink enough fluid to keep your pee (urine) pale yellow. Do not drink alcohol. Activity Return to your normal activities when your doctor says that it is safe. In the morning, first sit up on the side of the bed. When you feel okay, stand slowly while you hold onto something until you know that your balance is fine. Move slowly. Avoid sudden body or head movements or certain positions, as told by your doctor. Use a cane if you have trouble standing or walking. Sit down right away if you feel dizzy. Avoid doing any tasks or activities that can cause danger to you or others if you get dizzy. Avoid bending down if you feel dizzy. Place items in your home so that they are easy for you to reach without bending or leaning over. Do not drive or use machinery if you feel dizzy. General instructions Take over-the-counter and prescription medicines only as told by your doctor. Keep all follow-up visits. Contact a doctor if: Your medicine does not help your vertigo. Your problems get worse or you have new symptoms. You have a fever. You feel like you may vomit (nauseous), or this feeling gets worse. You start to vomit. Your family or friends see changes in how you act. You lose feeling (have numbness) in part of your body. You feel prickling and tingling in a part of your body. Get help right away if: You are always dizzy. You faint. You get very bad headaches. You get a stiff neck. Bright light starts to bother  you. You have trouble moving or talking. You feel weak in your hands, arms, or legs. You have changes in your hearing or in how you see (vision). These symptoms may be an emergency. Get help right away. Call your local emergency services (911 in the [place] Do not wait to see if the symptoms will go away. Do not drive yourself to the hospital. Summary Vertigo is the feeling that you or the things around you are moving when they are not. Your doctor will do tests to find the cause of your vertigo. You may be told to avoid some tasks, positions, or movements. Contact a doctor if your medicine is not helping, or if you have a fever, new symptoms, or a change in how you act. Get help right away if you get very bad headaches, or if you have changes in how you speak, hear, or see. This information is not intended to replace advice given to you by your health care provider. Make sure you discuss any questions you have with your healthcare provider. Document Revised: 07/03/2020 Document Reviewed: 07/03/2020 Elsevier Patient Education  2022 Elsevier Inc.  

## 2021-11-17 ENCOUNTER — Other Ambulatory Visit (HOSPITAL_COMMUNITY): Payer: BC Managed Care – PPO

## 2021-11-18 DIAGNOSIS — M5412 Radiculopathy, cervical region: Secondary | ICD-10-CM | POA: Diagnosis not present

## 2021-12-05 DIAGNOSIS — M5412 Radiculopathy, cervical region: Secondary | ICD-10-CM | POA: Diagnosis not present

## 2021-12-15 ENCOUNTER — Ambulatory Visit: Payer: BC Managed Care – PPO | Admitting: Nurse Practitioner

## 2021-12-29 ENCOUNTER — Ambulatory Visit: Payer: BC Managed Care – PPO | Admitting: Nurse Practitioner

## 2021-12-29 ENCOUNTER — Encounter: Payer: Self-pay | Admitting: Nurse Practitioner

## 2021-12-29 VITALS — BP 136/82 | HR 70 | Temp 97.9°F | Resp 20 | Ht 67.0 in | Wt 155.0 lb

## 2021-12-29 DIAGNOSIS — K5904 Chronic idiopathic constipation: Secondary | ICD-10-CM

## 2021-12-29 DIAGNOSIS — E109 Type 1 diabetes mellitus without complications: Secondary | ICD-10-CM | POA: Diagnosis not present

## 2021-12-29 DIAGNOSIS — E785 Hyperlipidemia, unspecified: Secondary | ICD-10-CM

## 2021-12-29 DIAGNOSIS — Z23 Encounter for immunization: Secondary | ICD-10-CM

## 2021-12-29 DIAGNOSIS — E039 Hypothyroidism, unspecified: Secondary | ICD-10-CM

## 2021-12-29 LAB — BAYER DCA HB A1C WAIVED: HB A1C (BAYER DCA - WAIVED): 6.8 % — ABNORMAL HIGH (ref 4.8–5.6)

## 2021-12-29 MED ORDER — LEVOTHYROXINE SODIUM 125 MCG PO TABS: ORAL_TABLET | ORAL | 1 refills | Status: DC

## 2021-12-29 MED ORDER — SIMVASTATIN 40 MG PO TABS: 40.0000 mg | ORAL_TABLET | Freq: Every day | ORAL | 1 refills | Status: DC

## 2021-12-29 MED ORDER — INSULIN ASPART 100 UNIT/ML IJ SOLN: INTRAMUSCULAR | 99 refills | Status: DC

## 2021-12-29 NOTE — Addendum Note (Signed)
Addended by: Rolena Infante on: 12/29/2021 12:47 PM ? ? Modules accepted: Orders ? ?

## 2021-12-29 NOTE — Progress Notes (Signed)
? ?Subjective:  ? ? Patient ID: Alejandra Mcdonald, female    DOB: [birth year], 61 y.o.   MRN: [id number] ? ? ?Chief Complaint: Medical Management of Chronic Issues ?  ? ?HPI: ? ?Alejandra Mcdonald is a 61 y.o. who identifies as a female who was assigned female at birth.  ? ?Social history: ?Lives with: husband ?Work history: [employer] ? ? ?Comes in today for follow up of the following chronic medical issues: ? ?1. Hyperlipidemia with target LDL less than 100 ?Does watch diet and stay very active. ?Lab Results  ?Component Value Date  ? CHOL 207 (H) 07/25/2021  ? HDL 103 07/25/2021  ? [place] 97 07/25/2021  ? TRIG 33 07/25/2021  ? CHOLHDL 2.0 07/25/2021  ? ? ? ?2. Type 1 diabetes mellitus with target hemoglobin A1c of less than 7.5 percent (HCC) ?Blood sugars are unchanged from previous. She has insulin pump. She deneis any low blood sugars. ?Lab Results  ?Component Value Date  ? HGBA1C 7.1 (H) 07/25/2021  ? ? ? ?3. Acquired hypothyroidism ?No problems that aware of. ?Lab Results  ?Component Value Date  ? TSH 0.947 07/25/2021  ? ? ? ?4. Chronic idiopathic constipation ?Is able to control with OTC meds.  ? ? ?New complaints: ?None today ? ?Allergies  ?Allergen Reactions  ? Ace Inhibitors Cough  ? Baclofen Other (See Comments)  ?  Raises blood sugar  ? Codeine Nausea And Vomiting  ? Lipitor [Atorvastatin] Other (See Comments)  ?  Myalgias  ? ?Outpatient Encounter Medications as of 12/29/2021  ?Medication Sig  ? albuterol (VENTOLIN HFA) 108 (90 Base) MCG/ACT inhaler Inhale 2 puffs into the lungs every 6 (six) hours as needed for wheezing or shortness of breath.  ? aspirin EC 81 MG tablet Take 81 mg by mouth daily. Swallow whole.  ? Calcium Citrate-Vitamin D (CALCIUM + D PO) Take 1 tablet by mouth daily.   ? Continuous Blood Gluc Receiver (FREESTYLE LIBRE 14 DAY READER) DEVI 1 Device by Does not apply route daily.  ? Continuous Blood Gluc Sensor (FREESTYLE LIBRE 14 DAY SENSOR) MISC 1 Device by Does not apply route every 14 (fourteen)  days.  ? fluticasone (FLONASE) 50 MCG/ACT nasal spray Place 2 sprays into both nostrils daily.  ? insulin aspart (NOVOLOG) 100 UNIT/ML injection Per pump  ? Insulin Human (INSULIN PUMP) SOLN Inject into the skin See admin instructions. Novolog Insulin USE UP TO 100 UNITS VIA PUMP PER DAY  ? levothyroxine (SYNTHROID) 125 MCG tablet TAKE 1 TABLET(125 MCG) BY MOUTH DAILY  ? magnesium hydroxide (MILK OF MAGNESIA) 400 MG/5ML suspension Take 30 mLs by mouth once a week.  ? Multiple Vitamin (MULTIVITAMIN WITH MINERALS) TABS tablet Take 1 tablet by mouth in the morning and at bedtime.  ? simvastatin (ZOCOR) 40 MG tablet Take 1 tablet (40 mg total) by mouth daily at 6 PM. (Needs to be seen before next refill)  ? [DISCONTINUED] amoxicillin (AMOXIL) 875 MG tablet Take 1 tablet (875 mg total) by mouth 2 (two) times daily. 1 po BID  ? [DISCONTINUED] meclizine (ANTIVERT) 25 MG tablet Take 1 tablet (25 mg total) by mouth 3 (three) times daily as needed for dizziness.  ? ?No facility-administered encounter medications on file as of 12/29/2021.  ? ? ?Past Surgical History:  ?Procedure Laterality Date  ? ANTERIOR CERVICAL DECOMPRESSION/DISCECTOMY FUSION 4 LEVELS N/A 04/24/2020  ? Procedure: ANTERIOR CERVICAL DECOMPRESSION FUSION CERVICAL 4-5, CERVICAL 5-6, CERVICAL 6-7 WITH INSTRUMENTATION AND ALLOGRAFT;  Surgeon: Phylliss Bob, MD;  Location: [facility];  Service: Orthopedics;  Laterality: N/A;  ? APPENDECTOMY    ? BACK SURGERY  03/2015  ? BILATERAL CARPAL TUNNEL RELEASE    ? BIOPSY  03/22/2019  ? Procedure: BIOPSY;  Surgeon: Rogene Houston, MD;  [facility]: [facility];  Service: Endoscopy;;  ascending colon polyp biopsy  ? CATARACT EXTRACTION Bilateral may & june 2017  ? CESAREAN SECTION    ? COLONOSCOPY N/A 03/22/2019  ? Procedure: COLONOSCOPY;  Surgeon: Rogene Houston, MD;  Location: [facility];  Service: Endoscopy;  Laterality: N/A;  930  ? POLYPECTOMY  03/22/2019  ? Procedure: POLYPECTOMY;  Surgeon: Rogene Houston, MD;  Location:  [facility];  Service: Endoscopy;;  ascending colon, hepatic flexure  ? TUBAL LIGATION    ? ? ?Family History  ?Problem Relation Age of Onset  ? Hypertension Mother   ? Diabetes Mother   ? Hypertension Father   ? Healthy Brother   ? Healthy Brother   ? Healthy Brother   ? Breast cancer Neg Hx   ? ? ? ? ?Controlled substance contract: n/a ? ? ? ? ?Review of Systems  ?Constitutional:  Negative for diaphoresis.  ?Eyes:  Negative for pain.  ?Respiratory:  Negative for shortness of breath.   ?Cardiovascular:  Negative for chest pain, palpitations and leg swelling.  ?Gastrointestinal:  Negative for abdominal pain.  ?Endocrine: Negative for polydipsia.  ?Skin:  Negative for rash.  ?Neurological:  Negative for dizziness, weakness and headaches.  ?Hematological:  Does not bruise/bleed easily.  ?All other systems reviewed and are negative. ? ?   ?Objective:  ? Physical Exam ?Vitals and nursing note reviewed.  ?Constitutional:   ?   General: She is not in acute distress. ?   Appearance: Normal appearance. She is well-developed.  ?HENT:  ?   Head: Normocephalic.  ?   Right Ear: Tympanic membrane normal.  ?   Left Ear: Tympanic membrane normal.  ?   Nose: Nose normal.  ?   Mouth/Throat:  ?   Mouth: Mucous membranes are moist.  ?Eyes:  ?   Pupils: Pupils are equal, round, and reactive to light.  ?Neck:  ?   Vascular: No carotid bruit or JVD.  ?Cardiovascular:  ?   Rate and Rhythm: Normal rate and regular rhythm.  ?   Heart sounds: Normal heart sounds.  ?Pulmonary:  ?   Effort: Pulmonary effort is normal. No respiratory distress.  ?   Breath sounds: Normal breath sounds. No wheezing or rales.  ?Chest:  ?   Chest wall: No tenderness.  ?Abdominal:  ?   General: Bowel sounds are normal. There is no distension or abdominal bruit.  ?   Palpations: Abdomen is soft. There is no hepatomegaly, splenomegaly, mass or pulsatile mass.  ?   Tenderness: There is no abdominal tenderness.  ?Musculoskeletal:     ?   General: Normal range of  motion.  ?   Cervical back: Normal range of motion and neck supple.  ?Lymphadenopathy:  ?   Cervical: No cervical adenopathy.  ?Skin: ?   General: Skin is warm and dry.  ?Neurological:  ?   Mental Status: She is alert and oriented to person, place, and time.  ?   Deep Tendon Reflexes: Reflexes are normal and symmetric.  ?Psychiatric:     ?   Behavior: Behavior normal.     ?   Thought Content: Thought content normal.     ?   Judgment: Judgment  normal.  ? ? ? ?BP 136/82   Pulse 70   Temp 97.9 ?F (36.6 ?C) (Temporal)   Resp 20   Ht 5' 7"  (1.702 m)   Wt 155 lb (70.3 kg)   SpO2 96%   BMI 24.28 kg/m?  ? ?HGBA1c 6.8% ? ? ?   ?Assessment & Plan:  ?Teria L Edelen in today with chief complaint of Medical Management of Chronic Issues ? ? ?1. Hyperlipidemia with target LDL less than 100 ?Low fat diet ?- Lipid panel ?- simvastatin (ZOCOR) 40 MG tablet; Take 1 tablet (40 mg total) by mouth daily at 6 PM. (Needs to be seen before next refill)  Dispense: 90 tablet; Refill: 1 ? ?2. Type 1 diabetes mellitus with target hemoglobin A1c of less than 7.5 percent (HCC) ?Continue to watch cabs in diet ?- Bayer DCA Hb A1c Waived ?- CBC with Differential/Platelet ?- CMP14+EGFR ?- insulin aspart (NOVOLOG) 100 UNIT/ML injection; Per pump  Dispense: 30 mL; Refill: PRN ? ?3. Acquired hypothyroidism ?Labs pending ?- levothyroxine (SYNTHROID) 125 MCG tablet; TAKE 1 TABLET(125 MCG) BY MOUTH DAILY  Dispense: 90 tablet; Refill: 1 ? ?4. Chronic idiopathic constipation ?Continue current routine ? ? ? ?The above assessment and management plan was discussed with the patient. The patient verbalized understanding of and has agreed to the management plan. Patient is aware to call the clinic if symptoms persist or worsen. Patient is aware when to return to the clinic for a follow-up visit. Patient educated on when it is appropriate to go to the emergency department.  ? ?Mary-Margaret Hassell Done, FNP ? ? ? ?

## 2021-12-30 LAB — CBC WITH DIFFERENTIAL/PLATELET
Basophils Absolute: 0.1 10*3/uL (ref 0.0–0.2)
Basos: 2 %
EOS (ABSOLUTE): 0.2 10*3/uL (ref 0.0–0.4)
Eos: 3 %
Hematocrit: 39.4 % (ref 34.0–46.6)
Hemoglobin: 13.9 g/dL (ref 11.1–15.9)
Immature Grans (Abs): 0 10*3/uL (ref 0.0–0.1)
Immature Granulocytes: 0 %
Lymphocytes Absolute: 1.3 10*3/uL (ref 0.7–3.1)
Lymphs: 29 %
MCH: 32 pg (ref 26.6–33.0)
MCHC: 35.3 g/dL (ref 31.5–35.7)
MCV: 91 fL (ref 79–97)
Monocytes Absolute: 0.4 10*3/uL (ref 0.1–0.9)
Monocytes: 8 %
Neutrophils Absolute: 2.8 10*3/uL (ref 1.4–7.0)
Neutrophils: 58 %
Platelets: 311 10*3/uL (ref 150–450)
RBC: 4.34 x10E6/uL (ref 3.77–5.28)
RDW: 12.5 % (ref 11.7–15.4)
WBC: 4.7 10*3/uL (ref 3.4–10.8)

## 2021-12-30 LAB — CMP14+EGFR
ALT: 26 IU/L (ref 0–32)
AST: 30 IU/L (ref 0–40)
Albumin/Globulin Ratio: 2.2 (ref 1.2–2.2)
Albumin: 4.4 g/dL (ref 3.8–4.8)
Alkaline Phosphatase: 71 IU/L (ref 44–121)
BUN/Creatinine Ratio: 10 — ABNORMAL LOW (ref 12–28)
BUN: 8 mg/dL (ref 8–27)
Bilirubin Total: 0.4 mg/dL (ref 0.0–1.2)
CO2: 27 mmol/L (ref 20–29)
Calcium: 9.9 mg/dL (ref 8.7–10.3)
Chloride: 101 mmol/L (ref 96–106)
Creatinine, Ser: 0.82 mg/dL (ref 0.57–1.00)
Globulin, Total: 2 g/dL (ref 1.5–4.5)
Glucose: 145 mg/dL — ABNORMAL HIGH (ref 70–99)
Potassium: 4.4 mmol/L (ref 3.5–5.2)
Sodium: 141 mmol/L (ref 134–144)
Total Protein: 6.4 g/dL (ref 6.0–8.5)
eGFR: 81 mL/min/{1.73_m2} (ref >59)

## 2021-12-30 LAB — LIPID PANEL
Chol/HDL Ratio: 2.1 ratio (ref 0.0–4.4)
Cholesterol, Total: 187 mg/dL (ref 100–199)
HDL: 88 mg/dL (ref >39)
LDL Chol Calc (NIH): 89 mg/dL (ref 0–99)
Triglycerides: 49 mg/dL (ref 0–149)
VLDL Cholesterol Cal: 10 mg/dL (ref 5–40)

## 2022-01-08 ENCOUNTER — Encounter: Payer: Self-pay | Admitting: Nurse Practitioner

## 2022-01-08 ENCOUNTER — Ambulatory Visit: Payer: BC Managed Care – PPO | Admitting: Nurse Practitioner

## 2022-01-08 VITALS — BP 150/84 | HR 68 | Temp 98.0°F | Ht 67.0 in | Wt 155.0 lb

## 2022-01-08 DIAGNOSIS — R0683 Snoring: Secondary | ICD-10-CM | POA: Diagnosis not present

## 2022-01-08 DIAGNOSIS — I10 Essential (primary) hypertension: Secondary | ICD-10-CM | POA: Diagnosis not present

## 2022-01-08 MED ORDER — LOSARTAN POTASSIUM 50 MG PO TABS: 50.0000 mg | ORAL_TABLET | Freq: Every day | ORAL | 1 refills | Status: DC

## 2022-01-08 NOTE — Patient Instructions (Signed)

## 2022-01-08 NOTE — Progress Notes (Signed)
Subjective:    Patient ID: YARET HUSH, female    DOB: 1961/07/31, 61 y.o.   MRN: [id number]   Chief Complaint: hypertension  Hypertension Pertinent negatives include no chest pain, headaches, palpitations or shortness of breath.  Patient come sin today c/o elevated blood pressure. Her blood pressure has been running high at home. She is currently on  no blood pressure meds.denies chest pain, sob or headache. At home it has been running around 093-267 systolic. She has had a couple of readings in the 124 systolic. BP Readings from Last 3 Encounters:  01/08/22 138/74  12/29/21 136/82  11/11/21 137/68    Has trouble sleeping. Her husband says he can hear her snoring upstairs when she is down stairs. Patient says she wakes up several times a night  Review of Systems  Constitutional:  Negative for diaphoresis.  Eyes:  Negative for pain.  Respiratory:  Negative for shortness of breath.   Cardiovascular:  Negative for chest pain, palpitations and leg swelling.  Gastrointestinal:  Negative for abdominal pain.  Endocrine: Negative for polydipsia.  Skin:  Negative for rash.  Neurological:  Negative for dizziness, weakness and headaches.  Hematological:  Does not bruise/bleed easily.  All other systems reviewed and are negative.     Objective:   Physical Exam Vitals and nursing note reviewed.  Constitutional:      General: She is not in acute distress.    Appearance: Normal appearance. She is well-developed.  Neck:     Vascular: No carotid bruit or JVD.  Cardiovascular:     Rate and Rhythm: Normal rate and regular rhythm.     Heart sounds: Normal heart sounds.  Pulmonary:     Effort: Pulmonary effort is normal. No respiratory distress.     Breath sounds: Normal breath sounds. No wheezing or rales.  Chest:     Chest wall: No tenderness.  Abdominal:     General: Bowel sounds are normal. There is no distension or abdominal bruit.     Palpations: Abdomen is soft. There is no  hepatomegaly, splenomegaly, mass or pulsatile mass.     Tenderness: There is no abdominal tenderness.  Musculoskeletal:        General: Normal range of motion.     Cervical back: Normal range of motion and neck supple.  Lymphadenopathy:     Cervical: No cervical adenopathy.  Skin:    General: Skin is warm and dry.  Neurological:     Mental Status: She is alert and oriented to person, place, and time.     Deep Tendon Reflexes: Reflexes are normal and symmetric.  Psychiatric:        Behavior: Behavior normal.        Thought Content: Thought content normal.        Judgment: Judgment normal.    BP 138/74   Pulse 68   Temp 98 F (36.7 C)   Ht '5\' 7"'$  (1.702 m)   Wt 155 lb (70.3 kg)   SpO2 99%   BMI 24.28 kg/m        Quintavia L Hanauer comes in today with chief complaint of Hypertension   Diagnosis and orders addressed:  1. Snoring Will talk after sleep study is gone - Nocturnal polysomnography (NPSG); Future  2. Primary hypertension Added losartan Keep diary of blood pressure - losartan (COZAAR) 50 MG tablet; Take 1 tablet (50 mg total) by mouth daily.  Dispense: 90 tablet; Refill: 1   Labs pending Health Maintenance reviewed Diet  and exercise encouraged  Follow up plan: As scheduled   Mary-Margaret Hassell Done, FNP

## 2022-01-20 ENCOUNTER — Telehealth: Payer: Self-pay | Admitting: Nurse Practitioner

## 2022-01-20 DIAGNOSIS — G4730 Sleep apnea, unspecified: Secondary | ICD-10-CM

## 2022-01-20 NOTE — Telephone Encounter (Signed)
Pt is not wanting to do a home sleep study is wanting to do it an [facility]. This is ordered under procedures, not a referral. Will get our referral coordinator to look at this to see if it is correct or if it needs to be placed another way.

## 2022-02-20 DIAGNOSIS — M5412 Radiculopathy, cervical region: Secondary | ICD-10-CM | POA: Diagnosis not present

## 2022-02-27 ENCOUNTER — Other Ambulatory Visit: Payer: Self-pay | Admitting: Nurse Practitioner

## 2022-02-27 DIAGNOSIS — E109 Type 1 diabetes mellitus without complications: Secondary | ICD-10-CM

## 2022-03-16 ENCOUNTER — Other Ambulatory Visit: Payer: Self-pay | Admitting: Nurse Practitioner

## 2022-03-23 DIAGNOSIS — M542 Cervicalgia: Secondary | ICD-10-CM | POA: Diagnosis not present

## 2022-03-23 DIAGNOSIS — M5412 Radiculopathy, cervical region: Secondary | ICD-10-CM | POA: Diagnosis not present

## 2022-03-31 ENCOUNTER — Ambulatory Visit: Payer: BC Managed Care – PPO | Admitting: Nurse Practitioner

## 2022-04-06 ENCOUNTER — Ambulatory Visit: Payer: BC Managed Care – PPO | Admitting: Nurse Practitioner

## 2022-04-08 ENCOUNTER — Telehealth: Payer: Self-pay

## 2022-04-08 NOTE — Chronic Care Management (AMB) (Signed)
  Care Management   Outreach Note  04/08/2022 Name: Alejandra Mcdonald MRN: [id number] DOB: 1960/09/18  An unsuccessful telephone outreach was attempted today. The patient was referred to the case management team for assistance with care management and care coordination.   Follow Up Plan:  A HIPAA compliant phone message was left for the patient providing contact information and requesting a return call.  The care management team will reach out to the patient again over the next 7 days.  If patient returns call to provider office, please advise to call  [facility] * at [phone]*  Noreene Larsson, [place],  [id number] Direct Dial: [phone] [email or web address]

## 2022-04-10 ENCOUNTER — Ambulatory Visit: Payer: BC Managed Care – PPO | Admitting: Nurse Practitioner

## 2022-04-17 DIAGNOSIS — M47812 Spondylosis without myelopathy or radiculopathy, cervical region: Secondary | ICD-10-CM | POA: Diagnosis not present

## 2022-04-21 NOTE — Chronic Care Management (AMB) (Signed)
  Care Coordination  Note  04/21/2022 Name: Alejandra Mcdonald MRN: [id number] DOB: 05-05-1961  Alejandra Mcdonald is a 61 y.o. year old female who is a primary care patient of Chevis Pretty, FNP. I reached out to [employer] by phone today to offer care coordination services.      Ms. Mathies was given information about Care Coordination services today including:  The Care Coordination services include support from the care team which includes your Nurse Coordinator, Clinical Social Worker, or Pharmacist.  The Care Coordination team is here to help remove barriers to the health concerns and goals most important to you. Care Coordination services are voluntary and the patient may decline or stop services at any time by request to their care team member.   Patient did not agree to participate in care coordination services at this time.  Follow up plan: Patient declines further follow up or participation in care coordination services.   Noreene Larsson, [place], [place] [address] Direct Dial: [phone] [email or web address]

## 2022-04-22 DIAGNOSIS — E039 Hypothyroidism, unspecified: Secondary | ICD-10-CM | POA: Diagnosis not present

## 2022-04-22 DIAGNOSIS — I10 Essential (primary) hypertension: Secondary | ICD-10-CM | POA: Diagnosis not present

## 2022-04-22 DIAGNOSIS — E782 Mixed hyperlipidemia: Secondary | ICD-10-CM | POA: Diagnosis not present

## 2022-04-22 DIAGNOSIS — E108 Type 1 diabetes mellitus with unspecified complications: Secondary | ICD-10-CM | POA: Diagnosis not present

## 2022-05-11 DIAGNOSIS — H43811 Vitreous degeneration, right eye: Secondary | ICD-10-CM | POA: Diagnosis not present

## 2022-05-12 ENCOUNTER — Other Ambulatory Visit: Payer: Self-pay | Admitting: Nurse Practitioner

## 2022-05-12 DIAGNOSIS — E109 Type 1 diabetes mellitus without complications: Secondary | ICD-10-CM

## 2022-05-26 DIAGNOSIS — G5603 Carpal tunnel syndrome, bilateral upper limbs: Secondary | ICD-10-CM | POA: Diagnosis not present

## 2022-06-01 DIAGNOSIS — M542 Cervicalgia: Secondary | ICD-10-CM | POA: Diagnosis not present

## 2022-06-11 DIAGNOSIS — G5603 Carpal tunnel syndrome, bilateral upper limbs: Secondary | ICD-10-CM | POA: Diagnosis not present

## 2022-06-11 DIAGNOSIS — G5601 Carpal tunnel syndrome, right upper limb: Secondary | ICD-10-CM | POA: Diagnosis not present

## 2022-06-11 DIAGNOSIS — G5602 Carpal tunnel syndrome, left upper limb: Secondary | ICD-10-CM | POA: Diagnosis not present

## 2022-06-24 ENCOUNTER — Ambulatory Visit: Payer: BC Managed Care – PPO | Admitting: Family Medicine

## 2022-06-24 ENCOUNTER — Encounter: Payer: Self-pay | Admitting: Family Medicine

## 2022-06-24 VITALS — BP 115/61 | HR 71 | Temp 98.2°F | Ht 67.0 in | Wt 156.8 lb

## 2022-06-24 DIAGNOSIS — J069 Acute upper respiratory infection, unspecified: Secondary | ICD-10-CM | POA: Diagnosis not present

## 2022-06-24 DIAGNOSIS — J40 Bronchitis, not specified as acute or chronic: Secondary | ICD-10-CM | POA: Diagnosis not present

## 2022-06-24 DIAGNOSIS — J329 Chronic sinusitis, unspecified: Secondary | ICD-10-CM | POA: Diagnosis not present

## 2022-06-24 MED ORDER — NIRMATRELVIR/RITONAVIR (PAXLOVID)TABLET: 3.0000 | ORAL_TABLET | Freq: Two times a day (BID) | ORAL | 0 refills | Status: AC

## 2022-06-24 MED ORDER — AZITHROMYCIN 250 MG PO TABS: ORAL_TABLET | ORAL | 0 refills | Status: DC

## 2022-06-24 NOTE — Progress Notes (Signed)
Chief Complaint  Patient presents with   Sore Throat   Ear Pain    Right    Cough   Nasal Congestion   Covid Exposure    HPI  Patient presents today for Patient presents with upper respiratory congestion. Rhinorrhea that is frequently purulent. There is moderate sore throat. Patient reports coughing frequently as well.  No sputum noted. There is no fever, chills or sweats. The patient denies being short of breath. Onset was 3 days ago. Husband Covid +  PMH: Smoking status noted ROS: Per HPI  Objective: BP 115/61   Pulse 71   Temp 98.2 F (36.8 C)   Ht '5\' 7"'$  (1.702 m)   Wt 156 lb 12.8 oz (71.1 kg)   SpO2 96%   BMI 24.56 kg/m  Gen: NAD, alert, cooperative with exam HEENT: NCAT, Nasal passages swollen, Tms clear CV: RRR, good S1/S2, no murmur Resp: Bronchitis changes with scattered wheezes, non-labored Ext: No edema, warm Neuro: Alert and oriented, No gross deficits  Assessment and plan:  1. Sinobronchitis   2. Upper respiratory infection with cough and congestion     Meds ordered this encounter  Medications   nirmatrelvir/ritonavir EUA (PAXLOVID) 20 x 150 MG & 10 x '100MG'$  TABS    Sig: Take 3 tablets by mouth 2 (two) times daily for 5 days. (Take nirmatrelvir 150 mg two tablets twice daily for 5 days and ritonavir 100 mg one tablet twice daily for 5 days) Patient GFR    Dispense:  30 tablet    Refill:  0   azithromycin (ZITHROMAX Z-PAK) 250 MG tablet    Sig: Take two right away Then one a day for the next 4 days.    Dispense:  6 each    Refill:  0    Orders Placed This Encounter  Procedures   COVID-19, Flu A+B and RSV    Order Specific Question:   Previously tested for COVID-19    Answer:   Unknown    Order Specific Question:   Resident in a congregate (group) care setting    Answer:   Unknown    Order Specific Question:   Is the patient student?    Answer:   No    Order Specific Question:   Employed in healthcare setting    Answer:   Unknown    Order  Specific Question:   Pregnant    Answer:   Unknown    Order Specific Question:   Has patient completed COVID vaccination(s) (2 doses of Pfizer/Moderna 1 dose of [employer])    Answer:   Unknown    Follow up as needed.  Claretta Fraise, MD

## 2022-06-25 LAB — COVID-19, FLU A+B AND RSV
Influenza A, NAA: NOT DETECTED
Influenza B, NAA: NOT DETECTED
RSV, NAA: NOT DETECTED
SARS-CoV-2, NAA: NOT DETECTED

## 2022-07-02 DIAGNOSIS — G5603 Carpal tunnel syndrome, bilateral upper limbs: Secondary | ICD-10-CM | POA: Diagnosis not present

## 2022-08-07 ENCOUNTER — Ambulatory Visit: Payer: BC Managed Care – PPO | Admitting: Family Medicine

## 2022-08-07 DIAGNOSIS — J40 Bronchitis, not specified as acute or chronic: Secondary | ICD-10-CM | POA: Diagnosis not present

## 2022-08-07 MED ORDER — PROMETHAZINE-DM 6.25-15 MG/5ML PO SYRP: 2.5000 mL | ORAL_SOLUTION | Freq: Four times a day (QID) | ORAL | 0 refills | Status: DC | PRN

## 2022-08-07 MED ORDER — BENZONATATE 100 MG PO CAPS
100.0000 mg | ORAL_CAPSULE | Freq: Three times a day (TID) | ORAL | 0 refills | Status: DC | PRN
Start: 2022-08-07 — End: 2022-10-09

## 2022-08-07 MED ORDER — AZITHROMYCIN 250 MG PO TABS
ORAL_TABLET | ORAL | 0 refills | Status: AC
Start: 2022-08-07 — End: 2022-08-12

## 2022-08-07 NOTE — Progress Notes (Signed)
Telephone visit  Subjective: CC: URI PCP: Chevis Pretty, FNP ZOX:WRUEAV L Alejandra Mcdonald is a 61 y.o. female calls for telephone consult today. Patient provides verbal consent for consult held via phone.  Due to COVID-19 pandemic this visit was conducted virtually. This visit type was conducted due to national recommendations for restrictions regarding the COVID-19 Pandemic (e.g. social distancing, sheltering in place) in an effort to limit this patient's exposure and mitigate transmission in our community. All issues noted in this document were discussed and addressed.  A physical exam was not performed with this format.   Location of patient: work Location of provider: [facility] Others present for call: none  1. URI Patient reports that she started getting a mild cough on Saturday and Sunday am, she started having increased coughing.  No hemoptysis or brown sputum.  Cough causes carpal tunnel to be worse.  No fevers, myalgia, N/V/D.  Mild rhinorrhea at night time.  Negative COVID19.  Using Mucinex/ Dayquil and nyquil.   ROS: Per HPI  Allergies  Allergen Reactions   Ace Inhibitors Cough   Baclofen Other (See Comments)    Raises blood sugar   Codeine Nausea And Vomiting   Lipitor [Atorvastatin] Other (See Comments)    Myalgias   Past Medical History:  Diagnosis Date   Arthritis    Asthma    as child   no problem in 3 years   Cataract    bilateral   Diabetes mellitus without complication (HCC)    Type I    Hyperlipidemia    Hypothyroidism    PONV (postoperative nausea and vomiting)    severe- patch did not help   Thyroid disease    Vasomotor phenomenon     Current Outpatient Medications:    albuterol (VENTOLIN HFA) 108 (90 Base) MCG/ACT inhaler, Inhale 2 puffs into the lungs every 6 (six) hours as needed for wheezing or shortness of breath., Disp: 8 g, Rfl: 0   aspirin EC 81 MG tablet, Take 81 mg by mouth daily. Swallow whole., Disp: , Rfl:    azithromycin (ZITHROMAX Z-PAK)  250 MG tablet, Take two right away Then one a day for the next 4 days., Disp: 6 each, Rfl: 0   Calcium Citrate-Vitamin D (CALCIUM + D PO), Take 1 tablet by mouth daily. , Disp: , Rfl:    Continuous Blood Gluc Receiver (FREESTYLE LIBRE 14 DAY READER) DEVI, 1 Device by Does not apply route daily., Disp: 2 Device, Rfl: 5   Continuous Blood Gluc Sensor (FREESTYLE LIBRE 14 DAY SENSOR) MISC, APPLY ONE SENSOR EVERY 14 DAYS, Disp: 2 each, Rfl: 5   fluticasone (FLONASE) 50 MCG/ACT nasal spray, Place 2 sprays into both nostrils daily., Disp: 16 g, Rfl: 6   Insulin Human (INSULIN PUMP) SOLN, Inject into the skin See admin instructions. Novolog Insulin USE UP TO 100 UNITS VIA PUMP PER DAY, Disp: , Rfl:    levothyroxine (SYNTHROID) 125 MCG tablet, TAKE 1 TABLET(125 MCG) BY MOUTH DAILY, Disp: 90 tablet, Rfl: 1   losartan (COZAAR) 50 MG tablet, Take 1 tablet (50 mg total) by mouth daily., Disp: 90 tablet, Rfl: 1   magnesium hydroxide (MILK OF MAGNESIA) 400 MG/5ML suspension, Take 30 mLs by mouth once a week., Disp: , Rfl:    Multiple Vitamin (MULTIVITAMIN WITH MINERALS) TABS tablet, Take 1 tablet by mouth in the morning and at bedtime., Disp: , Rfl:    NOVOLOG 100 UNIT/ML injection, ADMINISTER UP TO 100 UNITS VIA PUMP EVERY DAY, Disp: 30 mL, Rfl:  0   simvastatin (ZOCOR) 40 MG tablet, Take 1 tablet (40 mg total) by mouth daily at 6 PM. (Needs to be seen before next refill), Disp: 90 tablet, Rfl: 1  Assessment/ Plan: 61 y.o. female   Bronchitis - Plan: promethazine-dextromethorphan (PROMETHAZINE-DM) 6.25-15 MG/5ML syrup, benzonatate (TESSALON PERLES) 100 MG capsule, azithromycin (ZITHROMAX) 250 MG tablet  I suspect this is viral bronchitis but I have given her a pocket prescription for a Z-Pak should symptoms get worse over the Christmas holiday.  She understands indications to utilize this.  In the meantime, continue supportive care, cough suppression.  Start time: 10:10a End time: 10:15a  Total time spent on  patient care (including telephone call/ virtual visit): 5 minutes  [place], [place] [phone]

## 2022-08-20 ENCOUNTER — Ambulatory Visit: Payer: BC Managed Care – PPO | Admitting: Family Medicine

## 2022-08-20 ENCOUNTER — Encounter: Payer: Self-pay | Admitting: Family Medicine

## 2022-08-20 VITALS — BP 160/82 | HR 72 | Temp 97.9°F | Ht 67.0 in | Wt 157.0 lb

## 2022-08-20 DIAGNOSIS — R052 Subacute cough: Secondary | ICD-10-CM | POA: Diagnosis not present

## 2022-08-20 DIAGNOSIS — G5601 Carpal tunnel syndrome, right upper limb: Secondary | ICD-10-CM | POA: Diagnosis not present

## 2022-08-20 MED ORDER — ALBUTEROL SULFATE (2.5 MG/3ML) 0.083% IN NEBU: 2.5000 mg | INHALATION_SOLUTION | Freq: Four times a day (QID) | RESPIRATORY_TRACT | 1 refills | Status: AC | PRN

## 2022-08-20 MED ORDER — KETOROLAC TROMETHAMINE 30 MG/ML IJ SOLN
30.0000 mg | Freq: Once | INTRAMUSCULAR | Status: AC
  Administered 2022-08-20: 30 mg via INTRAMUSCULAR

## 2022-08-20 MED ORDER — METHYLPREDNISOLONE ACETATE 40 MG/ML IJ SUSP
40.0000 mg | Freq: Once | INTRAMUSCULAR | Status: AC
  Administered 2022-08-20: 40 mg via INTRAMUSCULAR

## 2022-08-20 NOTE — Progress Notes (Signed)
Subjective:  Patient ID: Alejandra Mcdonald, female    DOB: 04-16-1961, 62 y.o.   MRN: [id number]  Patient Care Team: Chevis Pretty, [place] as PCP - General (Nurse Practitioner) Leticia Clas, OD (Optometry)   Chief Complaint:  Cough (Patient has been seen twice and cough is no better.  Patient has surgery 1/10.)   HPI: Alejandra Mcdonald is a 62 y.o. female presenting on 08/20/2022 for Cough (Patient has been seen twice and cough is no better.  Patient has surgery 1/10.)   Pt presents today for ongoing cough. She was treated for bronchitis on 08/07/2022. States she has improved but the cough will not subside. Denies fever, chills, weakness, confusion, sputum production, or shortness of breath. She does report worsening right wrist pain due to her carpal tunnel. She has surgery scheduled in the 10th but reports the pain is an 8/10 today. She has not taken anything for the pain.   Cough This is a recurrent problem. The current episode started 1 to 4 weeks ago. The problem has been waxing and waning. The cough is Non-productive. Pertinent negatives include no chest pain, chills, ear congestion, ear pain, fever, headaches, heartburn, hemoptysis, myalgias, nasal congestion, postnasal drip, rash, rhinorrhea, sore throat, shortness of breath, sweats, weight loss or wheezing. Nothing aggravates the symptoms. She has tried OTC cough suppressant, prescription cough suppressant and oral steroids for the symptoms. The treatment provided mild relief.    Relevant past medical, surgical, family, and social history reviewed and updated as indicated.  Allergies and medications reviewed and updated. Data reviewed: Chart in Epic.   Past Medical History:  Diagnosis Date   Arthritis    Asthma    as child   no problem in 3 years   Cataract    bilateral   Diabetes mellitus without complication (HCC)    Type I    Hyperlipidemia    Hypothyroidism    PONV (postoperative nausea and vomiting)    severe-  patch did not help   Thyroid disease    Vasomotor phenomenon     Past Surgical History:  Procedure Laterality Date   ANTERIOR CERVICAL DECOMPRESSION/DISCECTOMY FUSION 4 LEVELS N/A 04/24/2020   Procedure: ANTERIOR CERVICAL DECOMPRESSION FUSION CERVICAL 4-5, CERVICAL 5-6, CERVICAL 6-7 WITH INSTRUMENTATION AND ALLOGRAFT;  Surgeon: Phylliss Bob, MD;  Location: [facility];  Service: Orthopedics;  Laterality: N/A;   APPENDECTOMY     BACK SURGERY  03/2015   BILATERAL CARPAL TUNNEL RELEASE     BIOPSY  03/22/2019   Procedure: BIOPSY;  Surgeon: Rogene Houston, MD;  [facility];  Service: Endoscopy;;  ascending colon polyp biopsy   CATARACT EXTRACTION Bilateral may & june 2017   CESAREAN SECTION     COLONOSCOPY N/A 03/22/2019   Procedure: COLONOSCOPY;  Surgeon: Rogene Houston, MD;  [facility]: [facility];  Service: Endoscopy;  Laterality: N/A;  930   POLYPECTOMY  03/22/2019   Procedure: POLYPECTOMY;  Surgeon: Rogene Houston, MD;  [facility];  Service: Endoscopy;;  ascending colon, hepatic flexure   TUBAL LIGATION      Social History   Socioeconomic History   Marital status: Married    Spouse name: Not on file   Number of children: Not on file   Years of education: Not on file   Highest education level: Not on file  Occupational History   Not on file  Tobacco Use   Smoking status: Never   Smokeless tobacco: Never  Vaping  Use   Vaping Use: Never used  Substance and Sexual Activity   Alcohol use: No   Drug use: No   Sexual activity: Not on file  Other Topics Concern   Not on file  Social History Narrative   Not on file   Social Determinants of Health   Financial Resource Strain: Not on file  Food Insecurity: Not on file  Transportation Needs: Not on file  Physical Activity: Not on file  Stress: Not on file  Social Connections: Not on file  Intimate Partner Violence: Not on file    Outpatient Encounter Medications as of 08/20/2022  Medication Sig    albuterol (PROVENTIL) (2.5 MG/3ML) 0.083% nebulizer solution Take 3 mLs (2.5 mg total) by nebulization every 6 (six) hours as needed for wheezing or shortness of breath.   albuterol (VENTOLIN HFA) 108 (90 Base) MCG/ACT inhaler Inhale 2 puffs into the lungs every 6 (six) hours as needed for wheezing or shortness of breath.   aspirin EC 81 MG tablet Take 81 mg by mouth daily. Swallow whole.   benzonatate (TESSALON PERLES) 100 MG capsule Take 1 capsule (100 mg total) by mouth 3 (three) times daily as needed.   Calcium Citrate-Vitamin D (CALCIUM + D PO) Take 1 tablet by mouth daily.    Continuous Blood Gluc Receiver (FREESTYLE LIBRE 14 DAY READER) DEVI 1 Device by Does not apply route daily.   Continuous Blood Gluc Sensor (FREESTYLE LIBRE 14 DAY SENSOR) MISC APPLY ONE SENSOR EVERY 14 DAYS   fluticasone (FLONASE) 50 MCG/ACT nasal spray Place 2 sprays into both nostrils daily.   Insulin Human (INSULIN PUMP) SOLN Inject into the skin See admin instructions. Novolog Insulin USE UP TO 100 UNITS VIA PUMP PER DAY   levothyroxine (SYNTHROID) 125 MCG tablet TAKE 1 TABLET(125 MCG) BY MOUTH DAILY   losartan (COZAAR) 50 MG tablet Take 1 tablet (50 mg total) by mouth daily.   magnesium hydroxide (MILK OF MAGNESIA) 400 MG/5ML suspension Take 30 mLs by mouth once a week.   Multiple Vitamin (MULTIVITAMIN WITH MINERALS) TABS tablet Take 1 tablet by mouth in the morning and at bedtime.   NOVOLOG 100 UNIT/ML injection ADMINISTER UP TO 100 UNITS VIA PUMP EVERY DAY   promethazine-dextromethorphan (PROMETHAZINE-DM) 6.25-15 MG/5ML syrup Take 2.5 mLs by mouth 4 (four) times daily as needed for cough.   simvastatin (ZOCOR) 40 MG tablet Take 1 tablet (40 mg total) by mouth daily at 6 PM. (Needs to be seen before next refill)   [EXPIRED] ketorolac (TORADOL) 30 MG/ML injection 30 mg    [EXPIRED] methylPREDNISolone acetate (DEPO-MEDROL) injection 40 mg    No facility-administered encounter medications on file as of 08/20/2022.     Allergies  Allergen Reactions   Ace Inhibitors Cough   Baclofen Other (See Comments)    Raises blood sugar   Codeine Nausea And Vomiting   Lipitor [Atorvastatin] Other (See Comments)    Myalgias    Review of Systems  Constitutional:  Negative for activity change, appetite change, chills, fatigue, fever and weight loss.  HENT: Negative.  Negative for congestion, dental problem, drooling, ear discharge, ear pain, facial swelling, hearing loss, mouth sores, nosebleeds, postnasal drip, rhinorrhea, sinus pressure, sinus pain, sneezing, sore throat, tinnitus, trouble swallowing and voice change.   Eyes: Negative.   Respiratory:  Positive for cough. Negative for apnea, hemoptysis, choking, chest tightness, shortness of breath, wheezing and stridor.   Cardiovascular:  Negative for chest pain, palpitations and leg swelling.  Gastrointestinal:  Negative for  abdominal pain, blood in stool, constipation, diarrhea, heartburn, nausea and vomiting.  Endocrine: Negative.   Genitourinary:  Negative for decreased urine volume, difficulty urinating, dysuria, frequency and urgency.  Musculoskeletal:  Positive for arthralgias. Negative for back pain, gait problem, joint swelling, myalgias, neck pain and neck stiffness.  Skin: Negative.  Negative for rash.  Allergic/Immunologic: Negative.   Neurological:  Negative for dizziness, weakness and headaches.  Hematological: Negative.   Psychiatric/Behavioral:  Negative for agitation, confusion, hallucinations, sleep disturbance and suicidal ideas.   All other systems reviewed and are negative.       Objective:  BP (!) 160/82   Pulse 72   Temp 97.9 F (36.6 C) (Temporal)   Ht '5\' 7"'$  (1.702 m)   Wt 157 lb (71.2 kg)   SpO2 98%   BMI 24.59 kg/m    Wt Readings from Last 3 Encounters:  08/20/22 157 lb (71.2 kg)  06/24/22 156 lb 12.8 oz (71.1 kg)  01/08/22 155 lb (70.3 kg)    Physical Exam Vitals and nursing note reviewed.  Constitutional:       General: She is not in acute distress.    Appearance: Normal appearance. She is well-developed, well-groomed and normal weight. She is not ill-appearing, toxic-appearing or diaphoretic.  HENT:     Head: Normocephalic and atraumatic.     Jaw: There is normal jaw occlusion.     Right Ear: Hearing normal.     Left Ear: Hearing normal.     Nose: Nose normal.     Mouth/Throat:     Lips: Pink.     Mouth: Mucous membranes are moist.     Pharynx: Oropharynx is clear. Uvula midline.  Eyes:     General: Lids are normal.     Extraocular Movements: Extraocular movements intact.     Conjunctiva/sclera: Conjunctivae normal.     Pupils: Pupils are equal, round, and reactive to light.  Neck:     Thyroid: No thyroid mass, thyromegaly or thyroid tenderness.     Vascular: No carotid bruit or JVD.     Trachea: Trachea and phonation normal.  Cardiovascular:     Rate and Rhythm: Normal rate and regular rhythm.     Chest Wall: PMI is not displaced.     Pulses: Normal pulses.     Heart sounds: Normal heart sounds. No murmur heard.    No friction rub. No gallop.  Pulmonary:     Effort: Pulmonary effort is normal. No respiratory distress.     Breath sounds: Normal breath sounds. No stridor. No wheezing, rhonchi or rales.     Comments: Dry cough noted Chest:     Chest wall: No tenderness.  Abdominal:     General: Bowel sounds are normal. There is no distension or abdominal bruit.     Palpations: Abdomen is soft. There is no hepatomegaly or splenomegaly.     Tenderness: There is no abdominal tenderness. There is no right CVA tenderness or left CVA tenderness.     Hernia: No hernia is present.  Musculoskeletal:     Cervical back: Normal range of motion and neck supple.     Right lower leg: No edema.     Left lower leg: No edema.     Comments: Splint on right wrist  Lymphadenopathy:     Cervical: No cervical adenopathy.  Skin:    General: Skin is warm and dry.     Capillary Refill: Capillary refill  takes less than 2 seconds.  Coloration: Skin is not cyanotic, jaundiced or pale.     Findings: No rash.  Neurological:     General: No focal deficit present.     Mental Status: She is alert and oriented to person, place, and time.     Sensory: Sensation is intact.     Motor: Motor function is intact.     Coordination: Coordination is intact.     Gait: Gait is intact.     Deep Tendon Reflexes: Reflexes are normal and symmetric.  Psychiatric:        Attention and Perception: Attention and perception normal.        Mood and Affect: Mood and affect normal.        Speech: Speech normal.        Behavior: Behavior normal. Behavior is cooperative.        Thought Content: Thought content normal.        Cognition and Memory: Cognition and memory normal.        Judgment: Judgment normal.     Results for orders placed or performed in visit on 06/24/22  COVID-19, Flu A+B and RSV   Specimen: Nasopharyngeal(NP) swabs in vial transport medium  Result Value Ref Range   SARS-CoV-2, NAA Not Detected Not Detected   Influenza A, NAA Not Detected Not Detected   Influenza B, NAA Not Detected Not Detected   RSV, NAA Not Detected Not Detected   Test Information: Comment        Pertinent labs & imaging results that were available during my care of the patient were reviewed by me and considered in my medical decision making.  Assessment & Plan:  Oreta was seen today for cough.  Diagnoses and all orders for this visit:  Subacute cough No indications of bacterial infection. Discussed lingering cough post viral infection and treatment. Aware to report new, worsening, or persistent symptoms. Can use albuterol as needed for cough. Burst of steroids in office.  -     albuterol (PROVENTIL) (2.5 MG/3ML) 0.083% nebulizer solution; Take 3 mLs (2.5 mg total) by nebulization every 6 (six) hours as needed for wheezing or shortness of breath. -     methylPREDNISolone acetate (DEPO-MEDROL) injection 40  mg  Carpal tunnel syndrome of right wrist Scheduled for surgery. Will dose with Toradol today for pain relief.  -     ketorolac (TORADOL) 30 MG/ML injection 30 mg     Continue all other maintenance medications.  Follow up plan: Return if symptoms worsen or fail to improve.   Continue healthy lifestyle choices, including diet (rich in fruits, vegetables, and lean proteins, and low in salt and simple carbohydrates) and exercise (at least 30 minutes of moderate physical activity daily).  Educational handout given for cough  The above assessment and management plan was discussed with the patient. The patient verbalized understanding of and has agreed to the management plan. Patient is aware to call the clinic if they develop any new symptoms or if symptoms persist or worsen. Patient is aware when to return to the clinic for a follow-up visit. Patient educated on when it is appropriate to go to the emergency department.   Monia Pouch, FNP-C [facility] [phone]

## 2022-08-26 DIAGNOSIS — G5601 Carpal tunnel syndrome, right upper limb: Secondary | ICD-10-CM | POA: Diagnosis not present

## 2022-09-07 ENCOUNTER — Other Ambulatory Visit: Payer: Self-pay | Admitting: Nurse Practitioner

## 2022-09-07 DIAGNOSIS — E109 Type 1 diabetes mellitus without complications: Secondary | ICD-10-CM

## 2022-09-08 DIAGNOSIS — G5601 Carpal tunnel syndrome, right upper limb: Secondary | ICD-10-CM | POA: Diagnosis not present

## 2022-09-15 ENCOUNTER — Other Ambulatory Visit: Payer: Self-pay | Admitting: Nurse Practitioner

## 2022-09-15 DIAGNOSIS — E039 Hypothyroidism, unspecified: Secondary | ICD-10-CM

## 2022-09-17 ENCOUNTER — Other Ambulatory Visit: Payer: Self-pay | Admitting: Nurse Practitioner

## 2022-09-17 DIAGNOSIS — Z1231 Encounter for screening mammogram for malignant neoplasm of breast: Secondary | ICD-10-CM

## 2022-09-21 ENCOUNTER — Ambulatory Visit
Admission: RE | Admit: 2022-09-21 | Discharge: 2022-09-21 | Disposition: A | Discharge status: Home / Self Care | Payer: BC Managed Care – PPO | Source: Ambulatory Visit | Attending: Nurse Practitioner | Admitting: Nurse Practitioner

## 2022-09-21 ENCOUNTER — Other Ambulatory Visit: Payer: Self-pay | Admitting: Nurse Practitioner

## 2022-09-21 DIAGNOSIS — Z1231 Encounter for screening mammogram for malignant neoplasm of breast: Secondary | ICD-10-CM

## 2022-09-21 DIAGNOSIS — N644 Mastodynia: Secondary | ICD-10-CM

## 2022-09-22 ENCOUNTER — Ambulatory Visit
Admission: RE | Admit: 2022-09-22 | Discharge: 2022-09-22 | Disposition: A | Discharge status: Home / Self Care | Payer: BC Managed Care – PPO | Source: Ambulatory Visit | Attending: Nurse Practitioner | Admitting: Nurse Practitioner

## 2022-09-22 DIAGNOSIS — R922 Inconclusive mammogram: Secondary | ICD-10-CM | POA: Diagnosis not present

## 2022-09-22 DIAGNOSIS — N644 Mastodynia: Secondary | ICD-10-CM

## 2022-09-22 DIAGNOSIS — N6489 Other specified disorders of breast: Secondary | ICD-10-CM | POA: Diagnosis not present

## 2022-09-28 DIAGNOSIS — Z1283 Encounter for screening for malignant neoplasm of skin: Secondary | ICD-10-CM | POA: Diagnosis not present

## 2022-09-28 DIAGNOSIS — D225 Melanocytic nevi of trunk: Secondary | ICD-10-CM | POA: Diagnosis not present

## 2022-09-28 DIAGNOSIS — L218 Other seborrheic dermatitis: Secondary | ICD-10-CM | POA: Diagnosis not present

## 2022-10-03 ENCOUNTER — Other Ambulatory Visit: Payer: Self-pay | Admitting: Nurse Practitioner

## 2022-10-09 ENCOUNTER — Encounter: Payer: Self-pay | Admitting: Nurse Practitioner

## 2022-10-09 ENCOUNTER — Ambulatory Visit (INDEPENDENT_AMBULATORY_CARE_PROVIDER_SITE_OTHER): Payer: BC Managed Care – PPO | Admitting: Nurse Practitioner

## 2022-10-09 ENCOUNTER — Ambulatory Visit (INDEPENDENT_AMBULATORY_CARE_PROVIDER_SITE_OTHER): Payer: BC Managed Care – PPO

## 2022-10-09 ENCOUNTER — Other Ambulatory Visit (HOSPITAL_COMMUNITY)
Admission: RE | Admit: 2022-10-09 | Discharge: 2022-10-09 | Disposition: A | Discharge status: Home / Self Care | Payer: BC Managed Care – PPO | Source: Ambulatory Visit | Attending: Nurse Practitioner | Admitting: Nurse Practitioner

## 2022-10-09 VITALS — BP 143/79 | HR 77 | Temp 98.2°F | Resp 20 | Ht 67.0 in | Wt 152.0 lb

## 2022-10-09 DIAGNOSIS — Z0001 Encounter for general adult medical examination with abnormal findings: Secondary | ICD-10-CM | POA: Diagnosis not present

## 2022-10-09 DIAGNOSIS — I10 Essential (primary) hypertension: Secondary | ICD-10-CM | POA: Diagnosis not present

## 2022-10-09 DIAGNOSIS — Z0000 Encounter for general adult medical examination without abnormal findings: Secondary | ICD-10-CM | POA: Diagnosis not present

## 2022-10-09 DIAGNOSIS — Z0389 Encounter for observation for other suspected diseases and conditions ruled out: Secondary | ICD-10-CM | POA: Diagnosis not present

## 2022-10-09 DIAGNOSIS — E785 Hyperlipidemia, unspecified: Secondary | ICD-10-CM | POA: Diagnosis not present

## 2022-10-09 DIAGNOSIS — E109 Type 1 diabetes mellitus without complications: Secondary | ICD-10-CM | POA: Diagnosis not present

## 2022-10-09 DIAGNOSIS — K5904 Chronic idiopathic constipation: Secondary | ICD-10-CM

## 2022-10-09 DIAGNOSIS — E039 Hypothyroidism, unspecified: Secondary | ICD-10-CM | POA: Diagnosis not present

## 2022-10-09 LAB — BAYER DCA HB A1C WAIVED: HB A1C (BAYER DCA - WAIVED): 7.1 % — ABNORMAL HIGH (ref 4.8–5.6)

## 2022-10-09 MED ORDER — LOSARTAN POTASSIUM 50 MG PO TABS: 50.0000 mg | ORAL_TABLET | Freq: Every day | ORAL | 1 refills | Status: DC

## 2022-10-09 MED ORDER — SIMVASTATIN 40 MG PO TABS: 40.0000 mg | ORAL_TABLET | Freq: Every day | ORAL | 1 refills | Status: DC

## 2022-10-09 NOTE — Addendum Note (Signed)
Addended by: Chevis Pretty on: 10/09/2022 11:06 AM   Modules accepted: Orders

## 2022-10-09 NOTE — Progress Notes (Signed)
Subjective:    Patient ID: Alejandra Mcdonald, female    DOB: [birth year], 62 y.o.   MRN: [id number]  Chief Complaint: medical management of chronic issues     HPI:  Alejandra Mcdonald is a 62 y.o. who identifies as a female who was assigned female at birth.   Social history: Lives with: husband Work history: Geologist, engineering   Comes in today for follow up of the following chronic medical issues:  1. Annual physical exam With PAP today  2. Hyperlipidemia with target LDL less than 100 Does try  to watch diet. Does no dedicated exercise. Lab Results  Component Value Date   CHOL 187 12/29/2021   HDL 88 12/29/2021   LDLCALC 89 12/29/2021   TRIG 49 12/29/2021   CHOLHDL 2.1 12/29/2021     3. Acquired hypothyroidism No issues that she is aware of. Lab Results  Component Value Date   TSH 0.947 07/25/2021     4. Type 1 diabetes mellitus with target hemoglobin A1c of less than 7.5 percent ([place]) Patient is on insulin pump. Has been diabetic since she was a child. Blood sugras fluctuate Lab Results  Component Value Date   HGBA1C 6.8 (H) 12/29/2021     5. Chronic idiopathic constipation She has to do a lot to have bowel movements. This has een going on for many years and she has seen GI about it. She has er own routine in order to have a bowel movement.   New complaints: None today  Allergies  Allergen Reactions   Ace Inhibitors Cough   Baclofen Other (See Comments)    Raises blood sugar   Codeine Nausea And Vomiting   Lipitor [Atorvastatin] Other (See Comments)    Myalgias   Outpatient Encounter Medications as of 10/09/2022  Medication Sig   albuterol (PROVENTIL) (2.5 MG/3ML) 0.083% nebulizer solution Take 3 mLs (2.5 mg total) by nebulization every 6 (six) hours as needed for wheezing or shortness of breath.   albuterol (VENTOLIN HFA) 108 (90 Base) MCG/ACT inhaler Inhale 2 puffs into the lungs every 6 (six) hours as needed for wheezing or shortness of breath.    aspirin EC 81 MG tablet Take 81 mg by mouth daily. Swallow whole.   benzonatate (TESSALON PERLES) 100 MG capsule Take 1 capsule (100 mg total) by mouth 3 (three) times daily as needed.   Calcium Citrate-Vitamin D (CALCIUM + D PO) Take 1 tablet by mouth daily.    Continuous Blood Gluc Receiver (FREESTYLE LIBRE 14 DAY READER) DEVI 1 Device by Does not apply route daily.   Continuous Blood Gluc Sensor (FREESTYLE LIBRE 14 DAY SENSOR) MISC Apply sensor every 14 days. DX: E10.9   fluticasone (FLONASE) 50 MCG/ACT nasal spray Place 2 sprays into both nostrils daily.   Insulin Human (INSULIN PUMP) SOLN Inject into the skin See admin instructions. Novolog Insulin USE UP TO 100 UNITS VIA PUMP PER DAY   levothyroxine (SYNTHROID) 125 MCG tablet TAKE 1 TABLET(125 MCG) BY MOUTH DAILY (NEEDS TO BE SEEN BEFORE NEXT REFILL)   losartan (COZAAR) 50 MG tablet Take 1 tablet (50 mg total) by mouth daily.   magnesium hydroxide (MILK OF MAGNESIA) 400 MG/5ML suspension Take 30 mLs by mouth once a week.   Multiple Vitamin (MULTIVITAMIN WITH MINERALS) TABS tablet Take 1 tablet by mouth in the morning and at bedtime.   NOVOLOG 100 UNIT/ML injection ADMINISTER UP 100 UNITS VIA PUMP EVERY DAY   promethazine-dextromethorphan (PROMETHAZINE-DM) 6.25-15 MG/5ML syrup Take 2.5  mLs by mouth 4 (four) times daily as needed for cough.   simvastatin (ZOCOR) 40 MG tablet Take 1 tablet (40 mg total) by mouth daily at 6 PM. (Needs to be seen before next refill)   No facility-administered encounter medications on file as of 10/09/2022.    Past Surgical History:  Procedure Laterality Date   ANTERIOR CERVICAL DECOMPRESSION/DISCECTOMY FUSION 4 LEVELS N/A 04/24/2020   Procedure: ANTERIOR CERVICAL DECOMPRESSION FUSION CERVICAL 4-5, CERVICAL 5-6, CERVICAL 6-7 WITH INSTRUMENTATION AND ALLOGRAFT;  Surgeon: Phylliss Bob, MD;  Location: [facility];  Service: Orthopedics;  Laterality: N/A;   APPENDECTOMY     BACK SURGERY  03/2015   BILATERAL CARPAL  TUNNEL RELEASE     BIOPSY  03/22/2019   Procedure: BIOPSY;  Surgeon: Rogene Houston, MD;  [facility];  Service: Endoscopy;;  ascending colon polyp biopsy   CATARACT EXTRACTION Bilateral may & june 2017   CESAREAN SECTION     COLONOSCOPY N/A 03/22/2019   Procedure: COLONOSCOPY;  Surgeon: Rogene Houston, MD;  Location: [facility];  Service: Endoscopy;  Laterality: N/A;  930   POLYPECTOMY  03/22/2019   Procedure: POLYPECTOMY;  Surgeon: Rogene Houston, MD;  [facility];  Service: Endoscopy;;  ascending colon, hepatic flexure   TUBAL LIGATION      Family History  Problem Relation Age of Onset   Hypertension Mother    Diabetes Mother    Hypertension Father    Healthy Brother    Healthy Brother    Healthy Brother    Breast cancer Neg Hx       Controlled substance contract: n/a     Review of Systems  Constitutional:  Negative for diaphoresis.  Eyes:  Negative for pain.  Respiratory:  Negative for shortness of breath.   Cardiovascular:  Negative for chest pain, palpitations and leg swelling.  Gastrointestinal:  Negative for abdominal pain.  Endocrine: Negative for polydipsia.  Skin:  Negative for rash.  Neurological:  Negative for dizziness, weakness and headaches.  Hematological:  Does not bruise/bleed easily.  All other systems reviewed and are negative.      Objective:   Physical Exam Vitals and nursing note reviewed.  Constitutional:      General: She is not in acute distress.    Appearance: Normal appearance. She is well-developed.  HENT:     Head: Normocephalic.     Right Ear: Tympanic membrane normal.     Left Ear: Tympanic membrane normal.     Nose: Nose normal.     Mouth/Throat:     Mouth: Mucous membranes are moist.  Eyes:     Pupils: Pupils are equal, round, and reactive to light.  Neck:     Vascular: No carotid bruit or JVD.  Cardiovascular:     Rate and Rhythm: Normal rate and regular rhythm.     Heart sounds: Normal heart  sounds.  Pulmonary:     Effort: Pulmonary effort is normal. No respiratory distress.     Breath sounds: Normal breath sounds. No wheezing or rales.  Chest:     Chest wall: No tenderness.  Abdominal:     General: Bowel sounds are normal. There is no distension or abdominal bruit.     Palpations: Abdomen is soft. There is no hepatomegaly, splenomegaly, mass or pulsatile mass.     Tenderness: There is no abdominal tenderness.  Musculoskeletal:        General: Normal range of motion.     Cervical  back: Normal range of motion and neck supple.  Lymphadenopathy:     Cervical: No cervical adenopathy.  Skin:    General: Skin is warm and dry.  Neurological:     Mental Status: She is alert and oriented to person, place, and time.     Deep Tendon Reflexes: Reflexes are normal and symmetric.  Psychiatric:        Behavior: Behavior normal.        Thought Content: Thought content normal.        Judgment: Judgment normal.    BP (!) 143/79   Pulse 77   Temp 98.2 F (36.8 C) (Temporal)   Resp 20   Ht '5\' 7"'$  (1.702 m)   Wt 152 lb (68.9 kg)   SpO2 98%   BMI 23.81 kg/m    Hgba1c 7.1%     Assessment & Plan:   Myliyah L Alcaide in today with chief complaint of Annual Exam   1. Annual physical exam  - CBC with Differential/Platelet - CMP14+EGFR  2. Hyperlipidemia with target LDL less than 100 Low fat diet - CBC with Differential/Platelet - CMP14+EGFR - Lipid panel - DG Chest 2 View - EKG 12-Lead - simvastatin (ZOCOR) 40 MG tablet; Take 1 tablet (40 mg total) by mouth daily at 6 PM. (Needs to be seen before next refill)  Dispense: 90 tablet; Refill: 1  3. Acquired hypothyroidism Labs pending - Thyroid Panel With TSH  4. Type 1 diabetes mellitus with target hemoglobin A1c of less than 7.5 percent ([facility]) Continue to see diabetic specialist - Bayer DCA Hb A1c Waived - Microalbumin / creatinine urine ratio  5. Chronic idiopathic constipation Continue current routine  6. Primary  hypertension [place] sodium diet - losartan (COZAAR) 50 MG tablet; Take 1 tablet (50 mg total) by mouth daily.  Dispense: 90 tablet; Refill: 1    The above assessment and management plan was discussed with the patient. The patient verbalized understanding of and has agreed to the management plan. Patient is aware to call the clinic if symptoms persist or worsen. Patient is aware when to return to the clinic for a follow-up visit. Patient educated on when it is appropriate to go to the emergency department.   Mary-Margaret Hassell Done, FNP

## 2022-10-10 LAB — CBC WITH DIFFERENTIAL/PLATELET
Basophils Absolute: 0.1 10*3/uL (ref 0.0–0.2)
Basos: 2 %
EOS (ABSOLUTE): 0.2 10*3/uL (ref 0.0–0.4)
Eos: 4 %
Hematocrit: 43.7 % (ref 34.0–46.6)
Hemoglobin: 14.8 g/dL (ref 11.1–15.9)
Immature Grans (Abs): 0 10*3/uL (ref 0.0–0.1)
Immature Granulocytes: 0 %
Lymphocytes Absolute: 1.7 10*3/uL (ref 0.7–3.1)
Lymphs: 32 %
MCH: 31.8 pg (ref 26.6–33.0)
MCHC: 33.9 g/dL (ref 31.5–35.7)
MCV: 94 fL (ref 79–97)
Monocytes Absolute: 0.3 10*3/uL (ref 0.1–0.9)
Monocytes: 6 %
Neutrophils Absolute: 2.9 10*3/uL (ref 1.4–7.0)
Neutrophils: 56 %
Platelets: 351 10*3/uL (ref 150–450)
RBC: 4.66 x10E6/uL (ref 3.77–5.28)
RDW: 12.2 % (ref 11.7–15.4)
WBC: 5.3 10*3/uL (ref 3.4–10.8)

## 2022-10-10 LAB — CMP14+EGFR
ALT: 22 IU/L (ref 0–32)
AST: 28 IU/L (ref 0–40)
Albumin/Globulin Ratio: 2.8 — ABNORMAL HIGH (ref 1.2–2.2)
Albumin: 4.8 g/dL (ref 3.9–4.9)
Alkaline Phosphatase: 69 IU/L (ref 44–121)
BUN/Creatinine Ratio: 12 (ref 12–28)
BUN: 10 mg/dL (ref 8–27)
Bilirubin Total: 0.3 mg/dL (ref 0.0–1.2)
CO2: 22 mmol/L (ref 20–29)
Calcium: 10.5 mg/dL — ABNORMAL HIGH (ref 8.7–10.3)
Chloride: 103 mmol/L (ref 96–106)
Creatinine, Ser: 0.81 mg/dL (ref 0.57–1.00)
Globulin, Total: 1.7 g/dL (ref 1.5–4.5)
Glucose: 59 mg/dL — ABNORMAL LOW (ref 70–99)
Potassium: 4.7 mmol/L (ref 3.5–5.2)
Sodium: 144 mmol/L (ref 134–144)
Total Protein: 6.5 g/dL (ref 6.0–8.5)
eGFR: 82 mL/min/{1.73_m2} (ref >59)

## 2022-10-10 LAB — LIPID PANEL
Chol/HDL Ratio: 2.3 ratio (ref 0.0–4.4)
Cholesterol, Total: 233 mg/dL — ABNORMAL HIGH (ref 100–199)
HDL: 101 mg/dL (ref >39)
LDL Chol Calc (NIH): 118 mg/dL — ABNORMAL HIGH (ref 0–99)
Triglycerides: 82 mg/dL (ref 0–149)
VLDL Cholesterol Cal: 14 mg/dL (ref 5–40)

## 2022-10-10 LAB — THYROID PANEL WITH TSH
Free Thyroxine Index: 2.3 (ref 1.2–4.9)
T3 Uptake Ratio: 30 % (ref 24–39)
T4, Total: 7.7 ug/dL (ref 4.5–12.0)
TSH: 7.69 u[IU]/mL — ABNORMAL HIGH (ref 0.450–4.500)

## 2022-10-10 MED ORDER — LEVOTHYROXINE SODIUM 150 MCG PO TABS: 150.0000 ug | ORAL_TABLET | Freq: Every day | ORAL | 3 refills | Status: DC

## 2022-10-10 NOTE — Addendum Note (Signed)
Addended by: Chevis Pretty on: 10/10/2022 07:58 AM   Modules accepted: Orders

## 2022-10-11 LAB — MICROALBUMIN / CREATININE URINE RATIO
Creatinine, Urine: 26.4 mg/dL
Microalb/Creat Ratio: <11 mg/g creat (ref 0–29)
Microalbumin, Urine: <3 ug/mL

## 2022-10-12 DIAGNOSIS — M5412 Radiculopathy, cervical region: Secondary | ICD-10-CM | POA: Diagnosis not present

## 2022-10-12 DIAGNOSIS — M546 Pain in thoracic spine: Secondary | ICD-10-CM | POA: Diagnosis not present

## 2022-10-12 LAB — CYTOLOGY - PAP: Diagnosis: NEGATIVE

## 2022-10-14 ENCOUNTER — Other Ambulatory Visit (HOSPITAL_COMMUNITY): Payer: Self-pay | Admitting: Orthopedic Surgery

## 2022-10-14 DIAGNOSIS — M542 Cervicalgia: Secondary | ICD-10-CM

## 2022-10-15 ENCOUNTER — Other Ambulatory Visit: Payer: Self-pay | Admitting: Nurse Practitioner

## 2022-10-15 DIAGNOSIS — E039 Hypothyroidism, unspecified: Secondary | ICD-10-CM

## 2022-10-23 IMAGING — MR MR CERVICAL SPINE W/O CM
5 series · 38 of 48 positions shown · non-contrast
Comparison: Cervical radiographs 04/24/2020.

CLINICAL DATA: Neck pain radiating down both arms.

EXAM:
MRI CERVICAL SPINE WITHOUT CONTRAST
TECHNIQUE: Multiplanar, multisequence MR imaging of the cervical spine was
performed. No intravenous contrast was administered.

[Series 5: T2 · sagittal · 3.0mm · 0.69mm/px · 6 of 15 slices shown (1 of 2)]
[im 1/15]
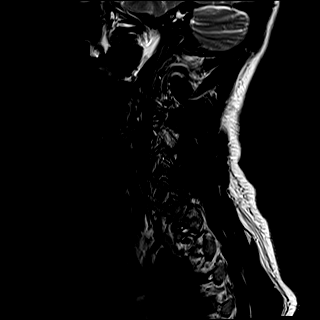
[im 3/15]
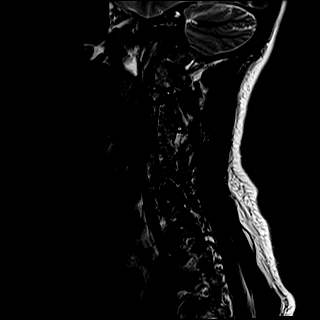
[im 6/15]
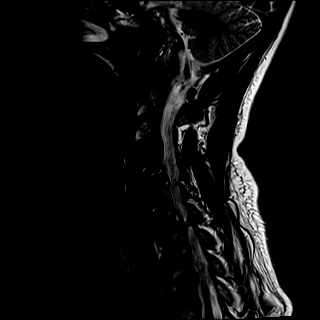
[im 9/15]
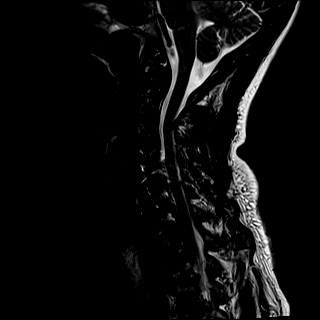
[im 12/15]
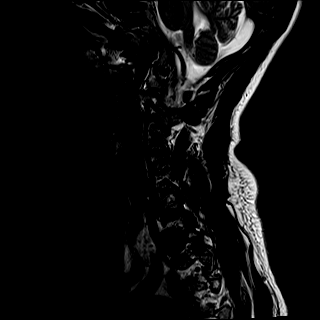
[im 15/15]
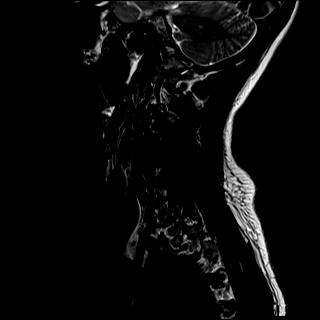

[Series 6: T1 · sagittal · 3.0mm · 0.86mm/px · 7 of 15 slices shown]
[im 1/15]
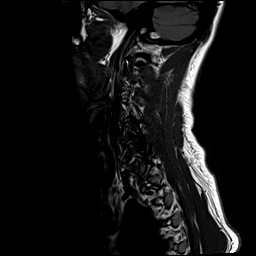
[im 3/15]
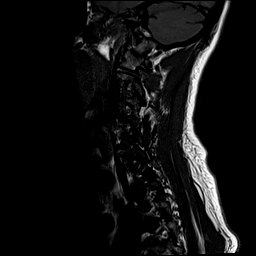
[im 5/15]
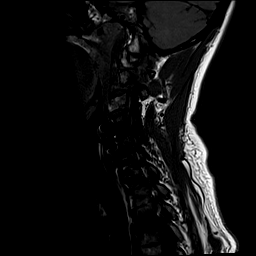
[im 8/15]
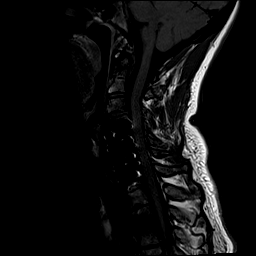
[im 10/15]
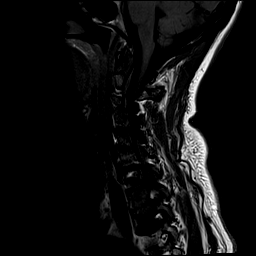
[im 12/15]
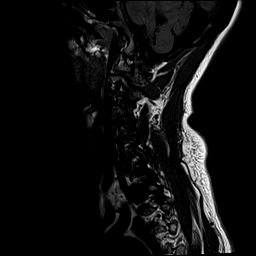
[im 15/15]
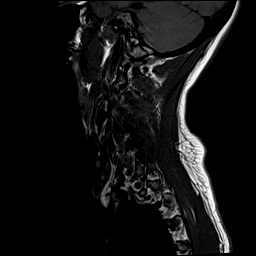

[Series 7: STIR · sagittal · 3.0mm · 0.69mm/px · 7 of 15 slices shown]
[im 1/15]
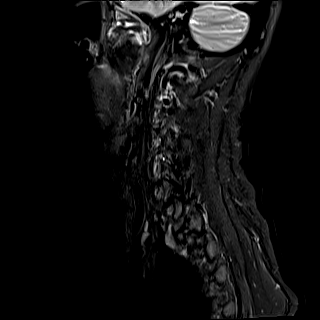
[im 3/15]
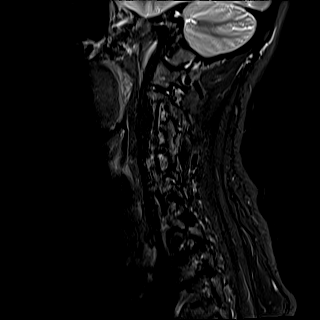
[im 5/15]
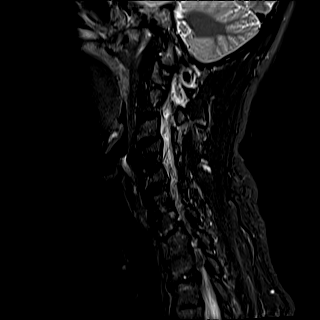
[im 8/15]
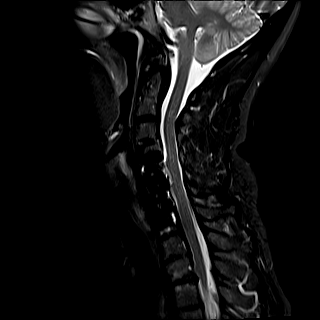
[im 10/15]
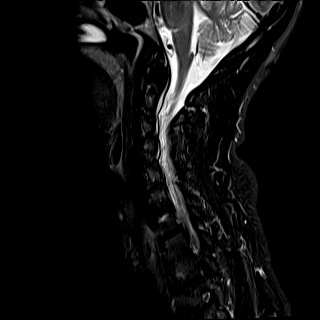
[im 12/15]
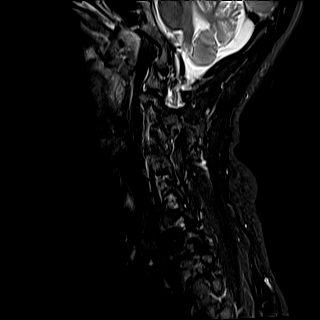
[im 15/15]
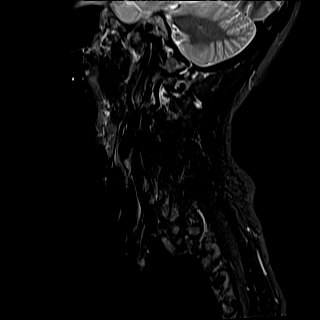

[Series 8: T2 · axial · 3.0mm · 0.70mm/px · z∈[-143,-47]mm · 10 of 32 slices shown (2 of 2)]
[im 1/32]
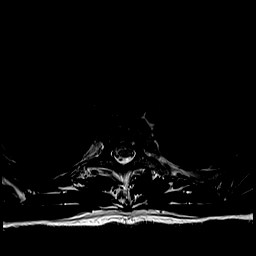
[im 3/32]
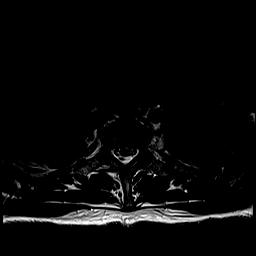
[im 5/32]
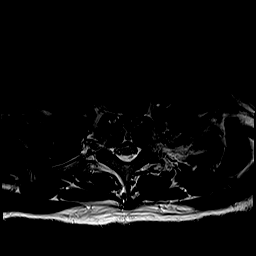
[im 8/32]
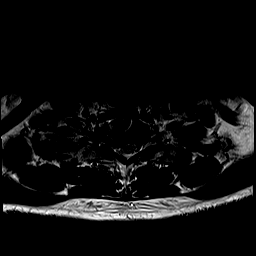
[im 10/32]
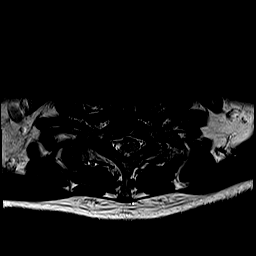
[im 15/32]
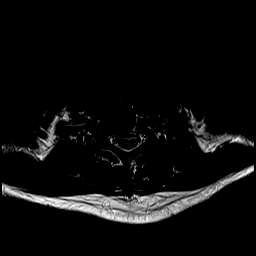
[im 17/32]
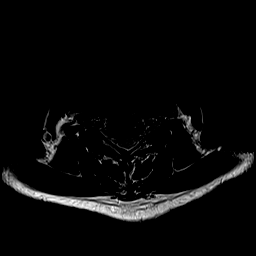
[im 22/32]
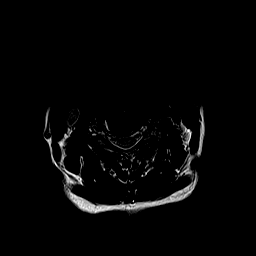
[im 27/32]
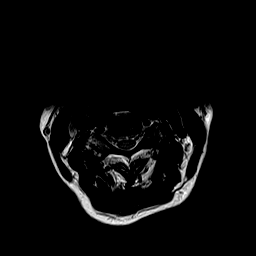
[im 32/32]
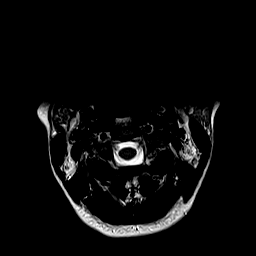

[Series 9: GRE · axial · 3.0mm · 0.35mm/px · z∈[-143,-47]mm · 8 of 32 slices shown]
[im 1/32]
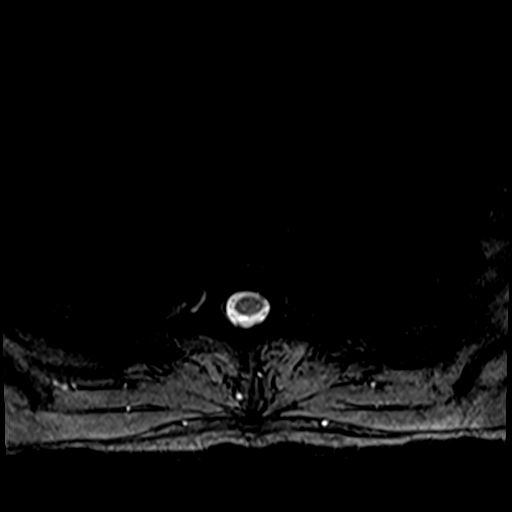
[im 5/32]
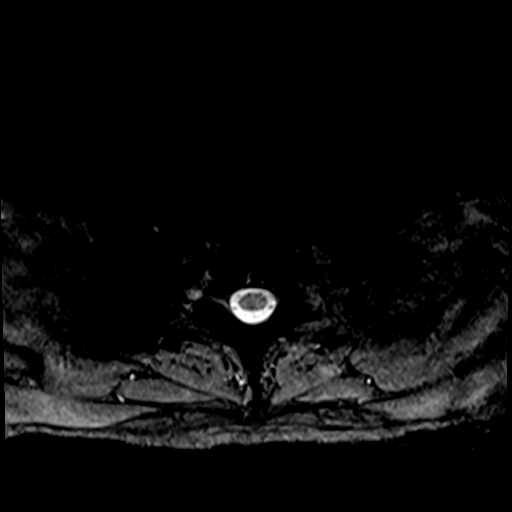
[im 10/32]
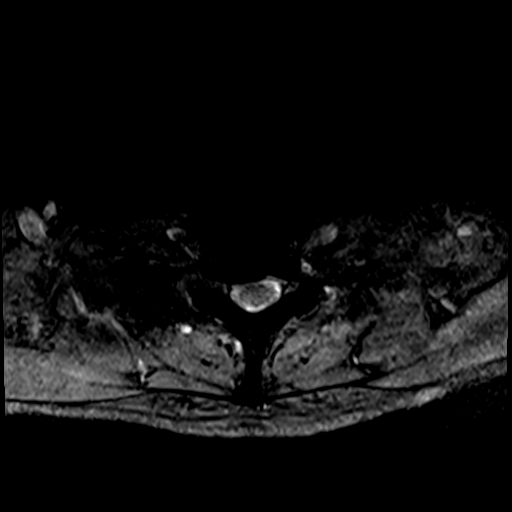
[im 15/32]
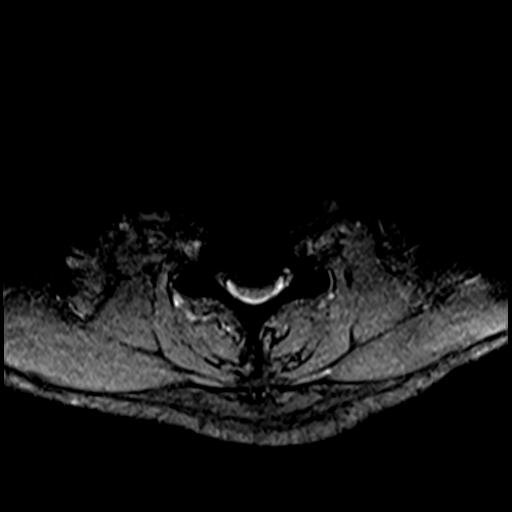
[im 17/32]
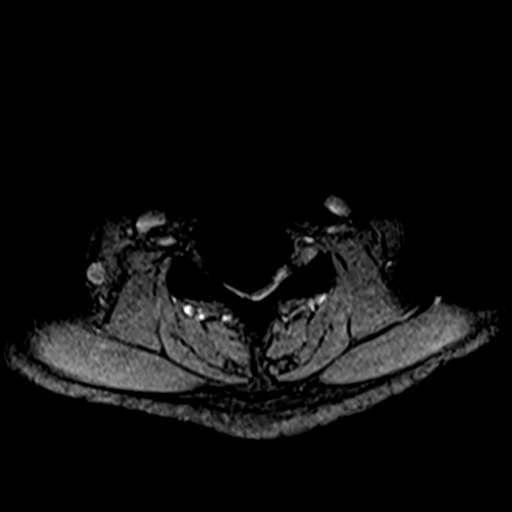
[im 22/32]
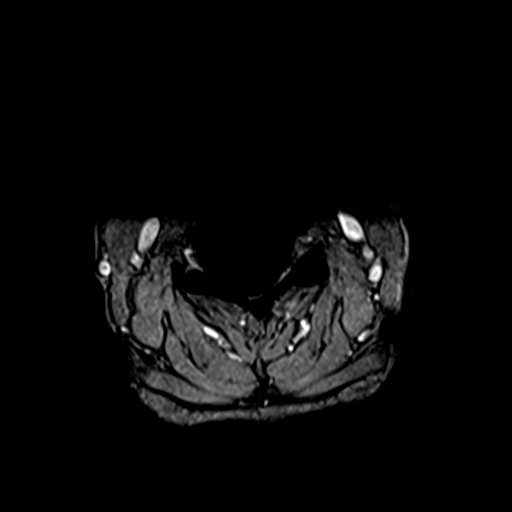
[im 27/32]
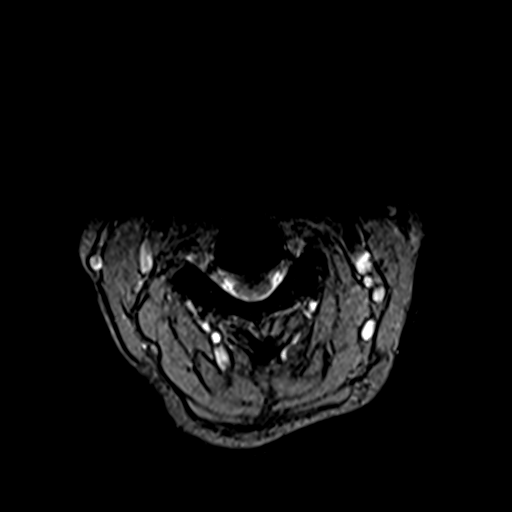
[im 32/32]
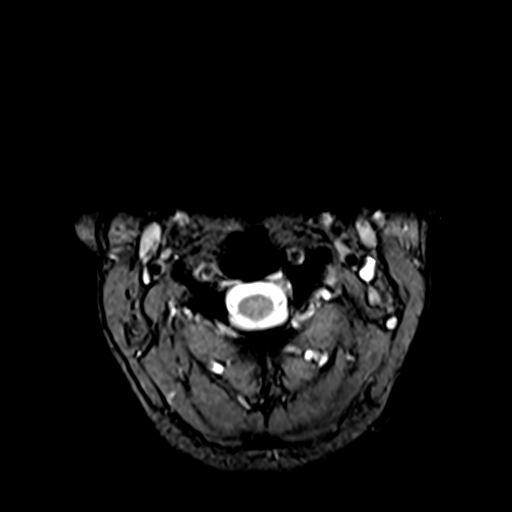

[38 of 48 positions shown; findings below may reference images not displayed]

FINDINGS: Alignment: Straightening of the normal cervical lordosis. No
substantial sagittal subluxation.

Vertebrae: C4-C7 ACDF. Degenerative/discogenic endplate signal
changes at T2-T3. No specific evidence of acute fracture
discitis/osteomyelitis.

Cord: Normal cord signal.

Posterior Fossa, vertebral arteries, paraspinal tissues: Visualized
vertebral artery flow voids are maintained. Unremarkable posterior
fossa.

Disc levels:

C2-C3: No significant disc protrusion, foraminal stenosis, or canal
stenosis.

C3-C4: No significant disc protrusion, foraminal stenosis, or canal
stenosis.

C4-C5: ACDF. No significant disc protrusion, foraminal stenosis, or
canal stenosis.

C5-C6: ACDF. Right-sided uncovertebral spurring with mild right
foraminal stenosis. Patent canal and left foramen.

C6-C7: ACDF. Right-sided uncovertebral hypertrophy with mild right
foraminal stenosis. Patent canal and left foramen.

C7-T1: Right eccentric disc bulge and endplate spurring, including
disc in the right lateral canal and foramen. Disc partially effaces
the right ventral CSF in the lateral canal without significant canal
stenosis. Moderate right foraminal stenosis. Patent left foramen.

T1-T2: Mild-to-moderate right foraminal stenosis. Pain canal left
foramina.

Right eccentric degenerative disc disease at T2-T3 with probably at
least moderate right T2-T3 foraminal stenosis. This level is not
imaged axially.
IMPRESSION: 1. At C7-T1, moderate right foraminal stenosis with right lateral
canal/foraminal disc protrusion potentially affecting the right C8
nerve. Mild-to-moderate right foraminal stenosis at T1-T2.
2. Degenerative disc disease at T2-T3 with probably at least
moderate right T2-T3 foraminal stenosis. This level is not imaged
axially and could be further assessed with a dedicated thoracic MRI.
3. C4-C7 ACDF with patent canal and foramina at the fused levels.

## 2022-10-26 DIAGNOSIS — D225 Melanocytic nevi of trunk: Secondary | ICD-10-CM | POA: Diagnosis not present

## 2022-10-26 DIAGNOSIS — D485 Neoplasm of uncertain behavior of skin: Secondary | ICD-10-CM | POA: Diagnosis not present

## 2022-10-27 DIAGNOSIS — G5601 Carpal tunnel syndrome, right upper limb: Secondary | ICD-10-CM | POA: Diagnosis not present

## 2022-10-30 DIAGNOSIS — I10 Essential (primary) hypertension: Secondary | ICD-10-CM | POA: Diagnosis not present

## 2022-10-30 DIAGNOSIS — E039 Hypothyroidism, unspecified: Secondary | ICD-10-CM | POA: Diagnosis not present

## 2022-10-30 DIAGNOSIS — E782 Mixed hyperlipidemia: Secondary | ICD-10-CM | POA: Diagnosis not present

## 2022-10-30 DIAGNOSIS — E108 Type 1 diabetes mellitus with unspecified complications: Secondary | ICD-10-CM | POA: Diagnosis not present

## 2022-11-12 ENCOUNTER — Ambulatory Visit (HOSPITAL_COMMUNITY)
Admission: RE | Admit: 2022-11-12 | Discharge: 2022-11-12 | Disposition: A | Discharge status: Home / Self Care | Payer: BC Managed Care – PPO | Source: Ambulatory Visit | Attending: Orthopedic Surgery | Admitting: Orthopedic Surgery

## 2022-11-12 DIAGNOSIS — M542 Cervicalgia: Secondary | ICD-10-CM | POA: Diagnosis not present

## 2022-11-23 DIAGNOSIS — G959 Disease of spinal cord, unspecified: Secondary | ICD-10-CM | POA: Diagnosis not present

## 2022-11-23 DIAGNOSIS — M5412 Radiculopathy, cervical region: Secondary | ICD-10-CM | POA: Diagnosis not present

## 2022-12-18 ENCOUNTER — Other Ambulatory Visit: Payer: Self-pay

## 2022-12-18 ENCOUNTER — Telehealth: Payer: Self-pay | Admitting: Nurse Practitioner

## 2022-12-18 DIAGNOSIS — E039 Hypothyroidism, unspecified: Secondary | ICD-10-CM

## 2022-12-18 NOTE — Telephone Encounter (Signed)
Future order placed for a recheck of thyroid levels

## 2022-12-18 NOTE — Telephone Encounter (Signed)
Pt scheduled to have labs rechecked on 5/16. Please add future lab order.

## 2022-12-31 ENCOUNTER — Other Ambulatory Visit: Payer: BC Managed Care – PPO

## 2022-12-31 DIAGNOSIS — E039 Hypothyroidism, unspecified: Secondary | ICD-10-CM | POA: Diagnosis not present

## 2023-01-01 ENCOUNTER — Other Ambulatory Visit: Payer: Self-pay

## 2023-01-01 DIAGNOSIS — E039 Hypothyroidism, unspecified: Secondary | ICD-10-CM

## 2023-01-01 LAB — THYROID PANEL WITH TSH
Free Thyroxine Index: 2.8 (ref 1.2–4.9)
T3 Uptake Ratio: 31 % (ref 24–39)
T4, Total: 9.1 ug/dL (ref 4.5–12.0)
TSH: 0.035 u[IU]/mL — ABNORMAL LOW (ref 0.450–4.500)

## 2023-01-03 ENCOUNTER — Other Ambulatory Visit: Payer: Self-pay | Admitting: Nurse Practitioner

## 2023-01-03 DIAGNOSIS — E109 Type 1 diabetes mellitus without complications: Secondary | ICD-10-CM

## 2023-01-12 DIAGNOSIS — G5601 Carpal tunnel syndrome, right upper limb: Secondary | ICD-10-CM | POA: Diagnosis not present

## 2023-01-20 DIAGNOSIS — E109 Type 1 diabetes mellitus without complications: Secondary | ICD-10-CM | POA: Diagnosis not present

## 2023-01-28 ENCOUNTER — Ambulatory Visit: Payer: BC Managed Care – PPO | Admitting: Nurse Practitioner

## 2023-01-28 ENCOUNTER — Encounter: Payer: Self-pay | Admitting: Nurse Practitioner

## 2023-01-28 ENCOUNTER — Encounter: Payer: Self-pay | Admitting: Pharmacist

## 2023-01-28 VITALS — BP 138/73 | HR 73 | Temp 97.8°F | Resp 20 | Ht 67.0 in | Wt 155.0 lb

## 2023-01-28 DIAGNOSIS — J40 Bronchitis, not specified as acute or chronic: Secondary | ICD-10-CM

## 2023-01-28 MED ORDER — METHYLPREDNISOLONE ACETATE 40 MG/ML IJ SUSP
40.0000 mg | Freq: Once | INTRAMUSCULAR | Status: AC
Start: 2023-01-28 — End: 2023-01-28
  Administered 2023-01-28: 40 mg via INTRAMUSCULAR

## 2023-01-28 MED ORDER — PREDNISONE 20 MG PO TABS: ORAL_TABLET | ORAL | 0 refills | Status: DC

## 2023-01-28 MED ORDER — BENZONATATE 100 MG PO CAPS: 200.0000 mg | ORAL_CAPSULE | Freq: Three times a day (TID) | ORAL | 0 refills | Status: DC | PRN

## 2023-01-28 NOTE — Progress Notes (Signed)
Subjective:    Patient ID: Alejandra Mcdonald, female    DOB: [birth year], 62 y.o.   MRN: [id number]   Chief Complaint: Cough and Nasal Congestion (Cough for 2 weeks)   Cough This is a new problem. The problem has been waxing and waning. The cough is Productive of sputum. Associated symptoms include chills, ear congestion, nasal congestion, rhinorrhea and shortness of breath. Pertinent negatives include no fever. Associated symptoms comments: wheezing. Nothing aggravates the symptoms. Treatments tried: sudeafed, inhaler,flonase. The treatment provided mild relief.     Patient Active Problem List   Diagnosis Date Noted   Radiculopathy 04/24/2020   Hemorrhoidal skin tag 04/22/2019   Constipation 04/10/2013   Hypothyroidism 11/18/2012   Hyperlipidemia with target LDL less than 100 11/18/2012   Type 1 diabetes mellitus with target hemoglobin A1c of less than 7.5 percent ([facility]) 11/18/2012       Review of Systems  Constitutional:  Positive for chills. Negative for fever.  HENT:  Positive for rhinorrhea.   Respiratory:  Positive for cough and shortness of breath.        Objective:   Physical Exam Constitutional:      Appearance: Normal appearance.  HENT:     Right Ear: Tympanic membrane normal.     Left Ear: Tympanic membrane normal.     Nose: Congestion and rhinorrhea present.     Mouth/Throat:     Pharynx: No oropharyngeal exudate or posterior oropharyngeal erythema.  Cardiovascular:     Rate and Rhythm: Normal rate and regular rhythm.     Heart sounds: Normal heart sounds.  Pulmonary:     Effort: Pulmonary effort is normal.     Breath sounds: Wheezing (exp wheezes throughout) present.  Neurological:     Mental Status: She is alert.    BP 138/73   Pulse 73   Temp 97.8 F (36.6 C) (Temporal)   Resp 20   Ht 5\' 7"  (1.702 m)   Wt 155 lb (70.3 kg)   SpO2 100%   BMI 24.28 kg/m         Assessment & Plan:   [name] in today with chief complaint of Cough and  Nasal Congestion (Cough for 2 weeks)   1. Bronchitis 1. Take meds as prescribed 2. Use a cool mist humidifier especially during the winter months and when heat has been humid. 3. Use saline nose sprays frequently 4. Saline irrigations of the nose can be very helpful if done frequently.  * 4X daily for 1 week*  * Use of a nettie pot can be helpful with this. Follow directions with this* 5. Drink plenty of fluids 6. Keep thermostat turn down low 7.For any cough or congestion- tessalon perles 8. For fever or aces or pains- take tylenol or ibuprofen appropriate for age and weight.  * for fevers greater than 101 orally you may alternate ibuprofen and tylenol every  3 hours.   Watch blood sugars very closely due to steroids.  Meds ordered this encounter  Medications   predniSONE (DELTASONE) 20 MG tablet    Sig: 2 po at sametime daily for 5 days-    Dispense:  10 tablet    Refill:  0    Order Specific Question:   Supervising Provider    Answer:   Arville Care A [[id number]]   methylPREDNISolone acetate (DEPO-MEDROL) injection 40 mg   benzonatate (TESSALON PERLES) 100 MG capsule    Sig: Take 2 capsules (200 mg total) by mouth 3 (three)  times daily as needed for cough.    Dispense:  20 capsule    Refill:  0    Order Specific Question:   Supervising Provider    Answer:   Arville Care A [[id number]]      The above assessment and management plan was discussed with the patient. The patient verbalized understanding of and has agreed to the management plan. Patient is aware to call the clinic if symptoms persist or worsen. Patient is aware when to return to the clinic for a follow-up visit. Patient educated on when it is appropriate to go to the emergency department.   Alejandra Daphine Deutscher, FNP

## 2023-01-28 NOTE — Patient Instructions (Signed)
Acute Bronchitis, Adult  Acute bronchitis is when air tubes in the lungs (bronchi) suddenly get swollen. The condition can make it hard for you to breathe. In adults, acute bronchitis usually goes away within 2 weeks. A cough caused by bronchitis may last up to 3 weeks. Smoking, allergies, and asthma can make the condition worse. What are the causes? Germs that cause cold and flu (viruses). The most common cause of this condition is the virus that causes the common cold. Bacteria. Substances that bother (irritate) the lungs, including: Smoke from cigarettes and other types of tobacco. Dust and pollen. Fumes from chemicals, gases, or burned fuel. Indoor or outdoor air pollution. What increases the risk? A weak body's defense system. This is also called the immune system. Any condition that affects your lungs and breathing, such as asthma. What are the signs or symptoms? A cough. Coughing up clear, yellow, or green mucus. Making high-pitched whistling sounds when you breathe, most often when you breathe out (wheezing). Runny or stuffy nose. Having too much mucus in your lungs (chest congestion). Shortness of breath. Body aches. A sore throat. How is this treated? Acute bronchitis may go away over time without treatment. Your doctor may tell you to: Drink more fluids. This will help thin your mucus so it is easier to cough up. Use a device that gets medicine into your lungs (inhaler). Use a vaporizer or a humidifier. These are machines that add water to the air. This helps with coughing and poor breathing. Take a medicine that thins mucus and helps clear it from your lungs. Take a medicine that prevents or stops coughing. It is not common to take an antibiotic medicine for this condition. Follow these instructions at home:  Take over-the-counter and prescription medicines only as told by your doctor. Use an inhaler, vaporizer, or humidifier as told by your doctor. Take two teaspoons  (10 mL) of honey at bedtime. This helps lessen your coughing at night. Drink enough fluid to keep your pee (urine) pale yellow. Do not smoke or use any products that contain nicotine or tobacco. If you need help quitting, ask your doctor. Get a lot of rest. Return to your normal activities when your doctor says that it is safe. Keep all follow-up visits. How is this prevented?  Wash your hands often with soap and water for at least 20 seconds. If you cannot use soap and water, use hand sanitizer. Avoid contact with people who have cold symptoms. Try not to touch your mouth, nose, or eyes with your hands. Avoid breathing in smoke or chemical fumes. Make sure to get the flu shot every year. Contact a doctor if: Your symptoms do not get better in 2 weeks. You have trouble coughing up the mucus. Your cough keeps you awake at night. You have a fever. Get help right away if: You cough up blood. You have chest pain. You have very bad shortness of breath. You faint or keep feeling like you are going to faint. You have a very bad headache. Your fever or chills get worse. These symptoms may be an emergency. Get help right away. Call your local emergency services (911 in the [place] Do not wait to see if the symptoms will go away. Do not drive yourself to the hospital. Summary Acute bronchitis is when air tubes in the lungs (bronchi) suddenly get swollen. In adults, acute bronchitis usually goes away within 2 weeks. Drink more fluids. This will help thin your mucus so it is easier   to cough up. Take over-the-counter and prescription medicines only as told by your doctor. Contact a doctor if your symptoms do not improve after 2 weeks of treatment. This information is not intended to replace advice given to you by your health care provider. Make sure you discuss any questions you have with your health care provider. Document Revised: 12/04/2020 Document Reviewed: 12/04/2020 Elsevier Patient  Education  2024 [employer]  

## 2023-01-30 ENCOUNTER — Other Ambulatory Visit: Payer: Self-pay

## 2023-01-30 ENCOUNTER — Encounter (HOSPITAL_COMMUNITY): Payer: Self-pay

## 2023-01-30 ENCOUNTER — Emergency Department (HOSPITAL_COMMUNITY)
Admission: EM | Admit: 2023-01-30 | Discharge: 2023-01-30 | Disposition: A | Discharge status: Home / Self Care | Payer: BC Managed Care – PPO | Attending: Emergency Medicine | Admitting: Emergency Medicine

## 2023-01-30 DIAGNOSIS — Z20822 Contact with and (suspected) exposure to covid-19: Secondary | ICD-10-CM | POA: Diagnosis not present

## 2023-01-30 DIAGNOSIS — Z7982 Long term (current) use of aspirin: Secondary | ICD-10-CM | POA: Insufficient documentation

## 2023-01-30 DIAGNOSIS — J329 Chronic sinusitis, unspecified: Secondary | ICD-10-CM | POA: Diagnosis not present

## 2023-01-30 DIAGNOSIS — R059 Cough, unspecified: Secondary | ICD-10-CM | POA: Diagnosis not present

## 2023-01-30 LAB — SARS CORONAVIRUS 2 BY RT PCR: SARS Coronavirus 2 by RT PCR: NEGATIVE

## 2023-01-30 MED ORDER — IPRATROPIUM-ALBUTEROL 0.5-2.5 (3) MG/3ML IN SOLN
3.0000 mL | Freq: Once | RESPIRATORY_TRACT | Status: AC
  Administered 2023-01-30: 3 mL via RESPIRATORY_TRACT
  Filled 2023-01-30: qty 3

## 2023-01-30 MED ORDER — HYDROCODONE BIT-HOMATROP MBR 5-1.5 MG/5ML PO SOLN: 5.0000 mL | Freq: Four times a day (QID) | ORAL | 0 refills | Status: DC | PRN

## 2023-01-30 MED ORDER — AMOXICILLIN-POT CLAVULANATE 875-125 MG PO TABS: 1.0000 | ORAL_TABLET | Freq: Two times a day (BID) | ORAL | 0 refills | Status: DC

## 2023-01-30 NOTE — Discharge Instructions (Addendum)
Your workup today is reassuring.  I have prescribed antibiotic for you to cover you for sinus infection.  Also recommend you do a sinus rinse.  For any concerning symptoms return to the emergency room otherwise follow-up with your primary care doctor.

## 2023-01-30 NOTE — ED Triage Notes (Signed)
Pt states she has had a cough for the past two weeks, was seen at [facility] for it and given steroids and cough drops. Home test for Covid was negative.

## 2023-01-30 NOTE — ED Provider Notes (Signed)
[place] EMERGENCY DEPARTMENT AT [facility] Provider Note   CSN: [id number] Arrival date & time: 01/30/23  4098     History  Chief Complaint  Patient presents with   Cough    Alejandra Mcdonald is a 62 y.o. female.  62 year old female presents today for about 2-week duration of cough.  She states occasionally it is productive.  No chest pain.  Was seen by PCP and given Tessalon Perles and course of steroids.  She states these medications have not helped.  She states particularly the [employer] have not helped with her cough.  She states she also developed sinus congestion with significant postnasal drip.  This has been ongoing for 10 days.  No fever.  Some change in her voice.  Tolerating fluids without difficulty.  The history is provided by the patient. No language interpreter was used.       Home Medications Prior to Admission medications   Medication Sig Start Date End Date Taking? Authorizing Provider  albuterol (PROVENTIL) (2.5 MG/3ML) 0.083% nebulizer solution Take 3 mLs (2.5 mg total) by nebulization every 6 (six) hours as needed for wheezing or shortness of breath. 08/20/22   Sonny Masters, FNP  albuterol (VENTOLIN HFA) 108 (90 Base) MCG/ACT inhaler Inhale 2 puffs into the lungs every 6 (six) hours as needed for wheezing or shortness of breath. 09/11/21   Daphine Deutscher, Mary-Margaret, FNP  aspirin EC 81 MG tablet Take 81 mg by mouth daily. Swallow whole.    [provider]  benzonatate (TESSALON PERLES) 100 MG capsule Take 2 capsules (200 mg total) by mouth 3 (three) times daily as needed for cough. 01/28/23   Daphine Deutscher, Mary-Margaret, FNP  Calcium Citrate-Vitamin D (CALCIUM + D PO) Take 1 tablet by mouth daily.     [provider]  Continuous Blood Gluc Receiver (FREESTYLE LIBRE 14 DAY READER) DEVI 1 Device by Does not apply route daily. 09/14/17   Daphine Deutscher Mary-Margaret, FNP  Continuous Blood Gluc Sensor (FREESTYLE LIBRE 14 DAY SENSOR) MISC Apply sensor  every 14 days. DX: E10.9 10/05/22   Daphine Deutscher, Mary-Margaret, FNP  Insulin Human (INSULIN PUMP) SOLN Inject into the skin See admin instructions. Novolog Insulin USE UP TO 100 UNITS VIA PUMP PER DAY    [provider]  levothyroxine (SYNTHROID) 150 MCG tablet Take 1 tablet (150 mcg total) by mouth daily. 10/10/22   Daphine Deutscher, Mary-Margaret, FNP  losartan (COZAAR) 50 MG tablet Take 1 tablet (50 mg total) by mouth daily. 10/09/22   Daphine Deutscher, Mary-Margaret, FNP  magnesium hydroxide (MILK OF MAGNESIA) 400 MG/5ML suspension Take 30 mLs by mouth once a week.    [provider]  Multiple Vitamin (MULTIVITAMIN WITH MINERALS) TABS tablet Take 1 tablet by mouth in the morning and at bedtime.    [provider]  NOVOLOG 100 UNIT/ML injection ADMINISTER UP TO 100 UNITS VIA PUMP EVERY DAY 01/04/23   Daphine Deutscher, Mary-Margaret, FNP  predniSONE (DELTASONE) 20 MG tablet 2 po at sametime daily for 5 days- 01/28/23   Bennie Pierini, FNP  simvastatin (ZOCOR) 40 MG tablet Take 1 tablet (40 mg total) by mouth daily at 6 PM. (Needs to be seen before next refill) 10/09/22   Bennie Pierini, FNP      Allergies    Ace inhibitors, Baclofen, Codeine, and Lipitor [atorvastatin]    Review of Systems   Review of Systems  Constitutional:  Negative for chills and fever.  HENT:  Positive for congestion, postnasal drip and sinus pressure. Negative for  sore throat and trouble swallowing.   Respiratory:  Positive for cough.   Cardiovascular:  Negative for chest pain.  Neurological:  Negative for light-headedness and headaches.  All other systems reviewed and are negative.   Physical Exam Updated Vital Signs BP (!) 177/72 (BP Location: Left Arm)   Pulse 93   Temp 98.2 F (36.8 C) (Oral)   Resp 20   SpO2 97%  Physical Exam Vitals and nursing note reviewed.  Constitutional:      General: She is not in acute distress.    Appearance: Normal appearance. She is not ill-appearing.  HENT:     Head:  Normocephalic and atraumatic.     Nose: Nose normal.  Eyes:     General: No scleral icterus.    Extraocular Movements: Extraocular movements intact.     Conjunctiva/sclera: Conjunctivae normal.  Cardiovascular:     Rate and Rhythm: Normal rate and regular rhythm.     Heart sounds: Normal heart sounds.  Pulmonary:     Effort: Pulmonary effort is normal. No respiratory distress.     Breath sounds: Normal breath sounds. No wheezing or rales.  Abdominal:     General: There is no distension.     Tenderness: There is no abdominal tenderness. There is no guarding.  Musculoskeletal:        General: Normal range of motion.     Cervical back: Normal range of motion.  Skin:    General: Skin is warm and dry.  Neurological:     General: No focal deficit present.     Mental Status: She is alert. Mental status is at baseline.     ED Results / Procedures / Treatments   Labs (all labs ordered are listed, but only abnormal results are displayed) Labs Reviewed  SARS CORONAVIRUS 2 BY RT PCR    EKG None  Radiology No results found.  Procedures Procedures    Medications Ordered in ED Medications  ipratropium-albuterol (DUONEB) 0.5-2.5 (3) MG/3ML nebulizer solution 3 mL (has no administration in time range)    ED Course/ Medical Decision Making/ A&P                             Medical Decision Making Risk Prescription drug management.   62 year old female presents today for 2-week duration of cough which is occasionally productive, and 10-day duration of sinus congestion associated with postnasal drip, and hoarseness.  No fever.  Well-appearing.  She is looking for symptom management.  Given she has had 10 days of sinus congestion and significant postnasal drip.  Will start her on Augmentin to cover for bacterial sinusitis.  Will also give her Hycodan for her cough.  Sinus rinse discussed.  Return precautions discussed.  Considered x-ray however lung sounds are clear.  SpO2 on room  air are well-preserved.  Low suspicion for pneumonia.  However Augmentin will also have significant coverage to cover for pneumonia.  Patient voices understanding and is in agreement with plan. Covid test negative  Final Clinical Impression(s) / ED Diagnoses Final diagnoses:  Sinusitis, unspecified chronicity, unspecified location    Rx / DC Orders ED Discharge Orders          Ordered    HYDROcodone bit-homatropine (HYCODAN) 5-1.5 MG/5ML syrup  Every 6 hours PRN        01/30/23 1031    amoxicillin-clavulanate (AUGMENTIN) 875-125 MG tablet  Every 12 hours        01/30/23 1031  Marita Kansas, PA-C 01/30/23 1033    Jacalyn Lefevre, MD 01/30/23 1121

## 2023-02-02 DIAGNOSIS — I10 Essential (primary) hypertension: Secondary | ICD-10-CM | POA: Diagnosis not present

## 2023-02-02 DIAGNOSIS — E108 Type 1 diabetes mellitus with unspecified complications: Secondary | ICD-10-CM | POA: Diagnosis not present

## 2023-02-02 DIAGNOSIS — E039 Hypothyroidism, unspecified: Secondary | ICD-10-CM | POA: Diagnosis not present

## 2023-02-02 DIAGNOSIS — E782 Mixed hyperlipidemia: Secondary | ICD-10-CM | POA: Diagnosis not present

## 2023-04-09 ENCOUNTER — Encounter: Payer: Self-pay | Admitting: Nurse Practitioner

## 2023-04-09 ENCOUNTER — Telehealth: Payer: Self-pay | Admitting: Nurse Practitioner

## 2023-04-09 ENCOUNTER — Ambulatory Visit: Payer: BC Managed Care – PPO | Admitting: Nurse Practitioner

## 2023-04-09 VITALS — BP 118/76 | HR 85 | Temp 98.3°F | Resp 20 | Ht 67.0 in | Wt 155.0 lb

## 2023-04-09 DIAGNOSIS — E785 Hyperlipidemia, unspecified: Secondary | ICD-10-CM | POA: Diagnosis not present

## 2023-04-09 DIAGNOSIS — K5904 Chronic idiopathic constipation: Secondary | ICD-10-CM

## 2023-04-09 DIAGNOSIS — E039 Hypothyroidism, unspecified: Secondary | ICD-10-CM | POA: Diagnosis not present

## 2023-04-09 DIAGNOSIS — I10 Essential (primary) hypertension: Secondary | ICD-10-CM | POA: Insufficient documentation

## 2023-04-09 DIAGNOSIS — E119 Type 2 diabetes mellitus without complications: Secondary | ICD-10-CM | POA: Diagnosis not present

## 2023-04-09 DIAGNOSIS — Z794 Long term (current) use of insulin: Secondary | ICD-10-CM | POA: Diagnosis not present

## 2023-04-09 DIAGNOSIS — E1169 Type 2 diabetes mellitus with other specified complication: Secondary | ICD-10-CM | POA: Diagnosis not present

## 2023-04-09 LAB — BAYER DCA HB A1C WAIVED: HB A1C (BAYER DCA - WAIVED): 7.2 % — ABNORMAL HIGH (ref 4.8–5.6)

## 2023-04-09 MED ORDER — PREDNISONE 20 MG PO TABS: ORAL_TABLET | ORAL | 0 refills | Status: DC

## 2023-04-09 MED ORDER — NOVOLOG 100 UNIT/ML IJ SOLN
INTRAMUSCULAR | 11 refills | Status: DC
Start: 2023-04-09 — End: 2023-04-09

## 2023-04-09 MED ORDER — HYDROCODONE BIT-HOMATROP MBR 5-1.5 MG/5ML PO SOLN: 5.0000 mL | Freq: Four times a day (QID) | ORAL | 0 refills | Status: DC | PRN

## 2023-04-09 MED ORDER — NOVOLOG 100 UNIT/ML IJ SOLN
INTRAMUSCULAR | 11 refills | Status: DC
Start: 2023-04-09 — End: 2023-10-12

## 2023-04-09 MED ORDER — LEVOTHYROXINE SODIUM 150 MCG PO TABS: 150.0000 ug | ORAL_TABLET | Freq: Every day | ORAL | 1 refills | Status: DC

## 2023-04-09 MED ORDER — LOSARTAN POTASSIUM 50 MG PO TABS
50.0000 mg | ORAL_TABLET | Freq: Every day | ORAL | 1 refills | Status: DC
Start: 2023-04-09 — End: 2023-10-12

## 2023-04-09 MED ORDER — SIMVASTATIN 40 MG PO TABS
40.0000 mg | ORAL_TABLET | Freq: Every day | ORAL | 1 refills | Status: DC
Start: 2023-04-09 — End: 2023-10-12

## 2023-04-09 NOTE — Progress Notes (Signed)
Subjective:    Patient ID: Alejandra Mcdonald, female    DOB: [birth year], 62 y.o.   MRN: [id number]   Chief Complaint: medical management of chronic issues     HPI:  [name] is a 62 y.o. who identifies as a female who was assigned female at birth.   Social history: Lives with: husband Work history: retired   Water engineer in today for follow up of the following chronic medical issues:  1. Acquired hypothyroidism No issues that she is aware of. Lab Results  Component Value Date   TSH 0.035 (L) 12/31/2022     2. Hyperlipidemia associated with type 2 diabetes mellitus (HCC) Does try to watch her diet and stays very active.  Lab Results  Component Value Date   HGBA1C 7.1 (H) 10/09/2022     3. Insulin-requiring or dependent type II diabetes mellitus (HCC) She has an insulin pump. She has been on a pump since she was a teenager. Is does well keeping control of her blood sugar.  4. Chronic idiopathic constipation Has had constipation and keeps it under control with OTC meds. Has gotten better since she retired.   New complaints: Has chronic cough. Is non stop at night. Denies acid reflux. Inhalers do not work and breathing treatments do not help.  Allergies  Allergen Reactions   Ace Inhibitors Cough   Baclofen Other (See Comments)    Raises blood sugar   Codeine Nausea And Vomiting   Lipitor [Atorvastatin] Other (See Comments)    Myalgias   Outpatient Encounter Medications as of 04/09/2023  Medication Sig   albuterol (PROVENTIL) (2.5 MG/3ML) 0.083% nebulizer solution Take 3 mLs (2.5 mg total) by nebulization every 6 (six) hours as needed for wheezing or shortness of breath.   albuterol (VENTOLIN HFA) 108 (90 Base) MCG/ACT inhaler Inhale 2 puffs into the lungs every 6 (six) hours as needed for wheezing or shortness of breath.   amoxicillin-clavulanate (AUGMENTIN) 875-125 MG tablet Take 1 tablet by mouth every 12 (twelve) hours.   aspirin EC 81 MG tablet Take 81 mg by  mouth daily. Swallow whole.   benzonatate (TESSALON PERLES) 100 MG capsule Take 2 capsules (200 mg total) by mouth 3 (three) times daily as needed for cough.   Calcium Citrate-Vitamin D (CALCIUM + D PO) Take 1 tablet by mouth daily.    Continuous Blood Gluc Receiver (FREESTYLE LIBRE 14 DAY READER) DEVI 1 Device by Does not apply route daily.   Continuous Blood Gluc Sensor (FREESTYLE LIBRE 14 DAY SENSOR) MISC Apply sensor every 14 days. DX: E10.9   HYDROcodone bit-homatropine (HYCODAN) 5-1.5 MG/5ML syrup Take 5 mLs by mouth every 6 (six) hours as needed for cough.   Insulin Human (INSULIN PUMP) SOLN Inject into the skin See admin instructions. Novolog Insulin USE UP TO 100 UNITS VIA PUMP PER DAY   levothyroxine (SYNTHROID) 150 MCG tablet Take 1 tablet (150 mcg total) by mouth daily.   losartan (COZAAR) 50 MG tablet Take 1 tablet (50 mg total) by mouth daily.   magnesium hydroxide (MILK OF MAGNESIA) 400 MG/5ML suspension Take 30 mLs by mouth once a week.   Multiple Vitamin (MULTIVITAMIN WITH MINERALS) TABS tablet Take 1 tablet by mouth in the morning and at bedtime.   NOVOLOG 100 UNIT/ML injection ADMINISTER UP TO 100 UNITS VIA PUMP EVERY DAY   predniSONE (DELTASONE) 20 MG tablet 2 po at sametime daily for 5 days-   simvastatin (ZOCOR) 40 MG tablet Take 1 tablet (40 mg total)  by mouth daily at 6 PM. (Needs to be seen before next refill)   No facility-administered encounter medications on file as of 04/09/2023.    Past Surgical History:  Procedure Laterality Date   ANTERIOR CERVICAL DECOMPRESSION/DISCECTOMY FUSION 4 LEVELS N/A 04/24/2020   Procedure: ANTERIOR CERVICAL DECOMPRESSION FUSION CERVICAL 4-5, CERVICAL 5-6, CERVICAL 6-7 WITH INSTRUMENTATION AND ALLOGRAFT;  Surgeon: Estill Bamberg, MD;  Location: [facility] OR;  Service: Orthopedics;  Laterality: N/A;   APPENDECTOMY     BACK SURGERY  03/2015   BILATERAL CARPAL TUNNEL RELEASE     BIOPSY  03/22/2019   Procedure: BIOPSY;  Surgeon: Malissa Hippo,  MD;  Location: [facility] ENDO SUITE;  Service: Endoscopy;;  ascending colon polyp biopsy   CATARACT EXTRACTION Bilateral may & june 2017   CESAREAN SECTION     COLONOSCOPY N/A 03/22/2019   Procedure: COLONOSCOPY;  Surgeon: Malissa Hippo, MD;  Location: [facility] SU[facility];  Service: Endoscopy;  Laterality: N/A;  930   POLYPECTOMY  03/22/2019   Procedure: POLYPECTOMY;  Surgeon: Malissa Hippo, MD;  Location: [facility];  Service: Endoscopy;;  ascending colon, hepatic flexure   TUBAL LIGATION      Family History  Problem Relation Age of Onset   Hypertension Mother    Diabetes Mother    Hypertension Father    Healthy Brother    Healthy Brother    Healthy Brother    Breast cancer Neg Hx       Controlled substance contract: n/a     Review of Systems  Constitutional:  Negative for diaphoresis.  Eyes:  Negative for pain.  Respiratory:  Positive for cough (non productive). Negative for shortness of breath.   Cardiovascular:  Negative for chest pain, palpitations and leg swelling.  Gastrointestinal:  Negative for abdominal pain.  Endocrine: Negative for polydipsia.  Skin:  Negative for rash.  Neurological:  Negative for dizziness, weakness and headaches.  Hematological:  Does not bruise/bleed easily.  All other systems reviewed and are negative.      Objective:   Physical Exam Vitals and nursing note reviewed.  Constitutional:      General: She is not in acute distress.    Appearance: Normal appearance. She is well-developed.  HENT:     Head: Normocephalic.     Right Ear: Tympanic membrane normal.     Left Ear: Tympanic membrane normal.     Nose: Nose normal.     Mouth/Throat:     Mouth: Mucous membranes are moist.  Eyes:     Pupils: Pupils are equal, round, and reactive to light.  Neck:     Vascular: No carotid bruit or JVD.  Cardiovascular:     Rate and Rhythm: Normal rate and regular rhythm.     Heart sounds: Normal heart sounds.  Pulmonary:     Effort: Pulmonary  effort is normal. No respiratory distress.     Breath sounds: Normal breath sounds. No wheezing or rales.  Chest:     Chest wall: No tenderness.  Abdominal:     General: Bowel sounds are normal. There is no distension or abdominal bruit.     Palpations: Abdomen is soft. There is no hepatomegaly, splenomegaly, mass or pulsatile mass.     Tenderness: There is no abdominal tenderness.  Musculoskeletal:        General: Normal range of motion.     Cervical back: Normal range of motion and neck supple.  Lymphadenopathy:     Cervical: No cervical adenopathy.  Skin:    General: Skin is warm and dry.  Neurological:     Mental Status: She is alert and oriented to person, place, and time.     Deep Tendon Reflexes: Reflexes are normal and symmetric.  Psychiatric:        Behavior: Behavior normal.        Thought Content: Thought content normal.        Judgment: Judgment normal.    BP 118/76   Pulse 85   Temp 98.3 F (36.8 C) (Temporal)   Resp 20   Ht 5\' 7"  (1.702 m)   Wt 155 lb (70.3 kg)   SpO2 95%   BMI 24.28 kg/m    HGBA1c - 7.2%    Assessment & Plan:   Winfred L Cooley comes in today with chief complaint of Annual Exam (No pap) and Cough   Diagnosis and orders addressed:  1. Acquired hypothyroidism Labs pending - Thyroid Panel With TSH  2. Hyperlipidemia associated with type 2 diabetes mellitus (HCC) Low fat diet - CBC with Differential/Platelet - CMP14+EGFR - Lipid panel - simvastatin (ZOCOR) 40 MG tablet; Take 1 tablet (40 mg total) by mouth daily at 6 PM. (Needs to be seen before next refill)  Dispense: 90 tablet; Refill: 1  3. Insulin-requiring or dependent type II diabetes mellitus (HCC) Continue current diet - Bayer DCA Hb A1c Waived - NOVOLOG 100 UNIT/ML injection; Per insulin pump  Dispense: 30 mL; Refill: 11  4. Chronic idiopathic constipation Continue increase in fiber  5. Primary hypertension Low sodium diet - losartan (COZAAR) 50 MG tablet; Take 1  tablet (50 mg total) by mouth daily.  Dispense: 90 tablet; Refill: 1   Labs pending Health Maintenance reviewed Diet and exercise encouraged  Follow up plan: 6 months   Mary-Margaret Daphine Deutscher, FNP

## 2023-04-09 NOTE — Telephone Encounter (Signed)
Need directions for Novolog

## 2023-04-10 LAB — CMP14+EGFR
ALT: 26 IU/L (ref 0–32)
AST: 30 IU/L (ref 0–40)
Albumin: 4.2 g/dL (ref 3.9–4.9)
Alkaline Phosphatase: 67 IU/L (ref 44–121)
BUN/Creatinine Ratio: 10 — ABNORMAL LOW (ref 12–28)
BUN: 9 mg/dL (ref 8–27)
Bilirubin Total: 0.3 mg/dL (ref 0.0–1.2)
CO2: 26 mmol/L (ref 20–29)
Calcium: 9.8 mg/dL (ref 8.7–10.3)
Chloride: 98 mmol/L (ref 96–106)
Creatinine, Ser: 0.88 mg/dL (ref 0.57–1.00)
Globulin, Total: 1.7 g/dL (ref 1.5–4.5)
Glucose: 221 mg/dL — ABNORMAL HIGH (ref 70–99)
Potassium: 4.4 mmol/L (ref 3.5–5.2)
Sodium: 136 mmol/L (ref 134–144)
Total Protein: 5.9 g/dL — ABNORMAL LOW (ref 6.0–8.5)
eGFR: 74 mL/min/{1.73_m2} (ref >59)

## 2023-04-10 LAB — LIPID PANEL
Chol/HDL Ratio: 2.2 ratio (ref 0.0–4.4)
Cholesterol, Total: 206 mg/dL — ABNORMAL HIGH (ref 100–199)
HDL: 94 mg/dL (ref >39)
LDL Chol Calc (NIH): 94 mg/dL (ref 0–99)
Triglycerides: 106 mg/dL (ref 0–149)
VLDL Cholesterol Cal: 18 mg/dL (ref 5–40)

## 2023-04-10 LAB — CBC WITH DIFFERENTIAL/PLATELET
Basophils Absolute: 0 10*3/uL (ref 0.0–0.2)
Basos: 1 %
EOS (ABSOLUTE): 0.1 10*3/uL (ref 0.0–0.4)
Eos: 2 %
Hematocrit: 40.5 % (ref 34.0–46.6)
Hemoglobin: 13.7 g/dL (ref 11.1–15.9)
Immature Grans (Abs): 0 10*3/uL (ref 0.0–0.1)
Immature Granulocytes: 0 %
Lymphocytes Absolute: 0.7 10*3/uL (ref 0.7–3.1)
Lymphs: 15 %
MCH: 31.3 pg (ref 26.6–33.0)
MCHC: 33.8 g/dL (ref 31.5–35.7)
MCV: 93 fL (ref 79–97)
Monocytes Absolute: 0.4 10*3/uL (ref 0.1–0.9)
Monocytes: 9 %
Neutrophils Absolute: 3.3 10*3/uL (ref 1.4–7.0)
Neutrophils: 73 %
Platelets: 234 10*3/uL (ref 150–450)
RBC: 4.38 x10E6/uL (ref 3.77–5.28)
RDW: 11.9 % (ref 11.7–15.4)
WBC: 4.4 10*3/uL (ref 3.4–10.8)

## 2023-04-10 LAB — THYROID PANEL WITH TSH
Free Thyroxine Index: 2.9 (ref 1.2–4.9)
T3 Uptake Ratio: 31 % (ref 24–39)
T4, Total: 9.3 ug/dL (ref 4.5–12.0)
TSH: 0.093 u[IU]/mL — ABNORMAL LOW (ref 0.450–4.500)

## 2023-04-10 MED ORDER — LEVOTHYROXINE SODIUM 125 MCG PO TABS: 125.0000 ug | ORAL_TABLET | Freq: Every day | ORAL | 3 refills | Status: DC

## 2023-04-10 NOTE — Addendum Note (Signed)
Addended by: Bennie Pierini on: 04/10/2023 07:39 AM   Modules accepted: Orders

## 2023-04-14 DIAGNOSIS — E109 Type 1 diabetes mellitus without complications: Secondary | ICD-10-CM | POA: Diagnosis not present

## 2023-04-20 ENCOUNTER — Telehealth: Payer: Self-pay | Admitting: Nurse Practitioner

## 2023-04-20 MED ORDER — BENZONATATE 100 MG PO CAPS: 100.0000 mg | ORAL_CAPSULE | Freq: Three times a day (TID) | ORAL | 0 refills | Status: DC | PRN

## 2023-04-20 MED ORDER — PREDNISONE 20 MG PO TABS
40.0000 mg | ORAL_TABLET | Freq: Every day | ORAL | 0 refills | Status: AC
Start: 2023-04-20 — End: 2023-04-25

## 2023-04-20 NOTE — Telephone Encounter (Signed)
Patient notified and verbalized understanding. 

## 2023-04-20 NOTE — Telephone Encounter (Signed)
Will do low dose steroid for 5 days. Watch blood sugars because will increase them. Will also send in some tessalon perles.   Meds ordered this encounter  Medications   predniSONE (DELTASONE) 20 MG tablet    Sig: Take 2 tablets (40 mg total) by mouth daily with breakfast for 5 days. 2 po daily for 5 days    Dispense:  10 tablet    Refill:  0    Order Specific Question:   Supervising Provider    Answer:   Arville Care A [[id number]]   benzonatate (TESSALON PERLES) 100 MG capsule    Sig: Take 1 capsule (100 mg total) by mouth 3 (three) times daily as needed for cough.    Dispense:  20 capsule    Refill:  0    Order Specific Question:   Supervising Provider    Answer:   Nils Pyle [[id number]]   Alejandra Daphine Deutscher, FNP

## 2023-04-20 NOTE — Telephone Encounter (Signed)
Pt called to let MMM know that her cough is no better. Wants to know if something else can be sent in for her?

## 2023-04-28 ENCOUNTER — Telehealth: Payer: BC Managed Care – PPO

## 2023-04-28 DIAGNOSIS — R051 Acute cough: Secondary | ICD-10-CM | POA: Diagnosis not present

## 2023-04-28 DIAGNOSIS — R030 Elevated blood-pressure reading, without diagnosis of hypertension: Secondary | ICD-10-CM | POA: Diagnosis not present

## 2023-04-28 DIAGNOSIS — Z6824 Body mass index (BMI) 24.0-24.9, adult: Secondary | ICD-10-CM | POA: Diagnosis not present

## 2023-04-28 DIAGNOSIS — J209 Acute bronchitis, unspecified: Secondary | ICD-10-CM | POA: Diagnosis not present

## 2023-05-17 DIAGNOSIS — E109 Type 1 diabetes mellitus without complications: Secondary | ICD-10-CM | POA: Diagnosis not present

## 2023-05-17 DIAGNOSIS — Z794 Long term (current) use of insulin: Secondary | ICD-10-CM | POA: Diagnosis not present

## 2023-05-28 DIAGNOSIS — H3561 Retinal hemorrhage, right eye: Secondary | ICD-10-CM | POA: Diagnosis not present

## 2023-05-28 DIAGNOSIS — H524 Presbyopia: Secondary | ICD-10-CM | POA: Diagnosis not present

## 2023-05-28 LAB — HM DIABETES EYE EXAM

## 2023-06-01 ENCOUNTER — Other Ambulatory Visit: Payer: BC Managed Care – PPO

## 2023-06-01 DIAGNOSIS — E108 Type 1 diabetes mellitus with unspecified complications: Secondary | ICD-10-CM | POA: Diagnosis not present

## 2023-06-04 DIAGNOSIS — E109 Type 1 diabetes mellitus without complications: Secondary | ICD-10-CM | POA: Diagnosis not present

## 2023-06-08 DIAGNOSIS — I10 Essential (primary) hypertension: Secondary | ICD-10-CM | POA: Diagnosis not present

## 2023-06-08 DIAGNOSIS — E782 Mixed hyperlipidemia: Secondary | ICD-10-CM | POA: Diagnosis not present

## 2023-06-08 DIAGNOSIS — E108 Type 1 diabetes mellitus with unspecified complications: Secondary | ICD-10-CM | POA: Diagnosis not present

## 2023-06-08 DIAGNOSIS — E039 Hypothyroidism, unspecified: Secondary | ICD-10-CM | POA: Diagnosis not present

## 2023-06-15 DIAGNOSIS — D225 Melanocytic nevi of trunk: Secondary | ICD-10-CM | POA: Diagnosis not present

## 2023-06-15 DIAGNOSIS — S81819A Laceration without foreign body, unspecified lower leg, initial encounter: Secondary | ICD-10-CM | POA: Diagnosis not present

## 2023-06-15 DIAGNOSIS — Z1283 Encounter for screening for malignant neoplasm of skin: Secondary | ICD-10-CM | POA: Diagnosis not present

## 2023-06-17 DIAGNOSIS — E109 Type 1 diabetes mellitus without complications: Secondary | ICD-10-CM | POA: Diagnosis not present

## 2023-06-22 DIAGNOSIS — R053 Chronic cough: Secondary | ICD-10-CM | POA: Diagnosis not present

## 2023-06-22 DIAGNOSIS — K219 Gastro-esophageal reflux disease without esophagitis: Secondary | ICD-10-CM | POA: Diagnosis not present

## 2023-07-07 DIAGNOSIS — E109 Type 1 diabetes mellitus without complications: Secondary | ICD-10-CM | POA: Diagnosis not present

## 2023-07-08 DIAGNOSIS — E108 Type 1 diabetes mellitus with unspecified complications: Secondary | ICD-10-CM | POA: Diagnosis not present

## 2023-07-08 DIAGNOSIS — I10 Essential (primary) hypertension: Secondary | ICD-10-CM | POA: Diagnosis not present

## 2023-07-08 DIAGNOSIS — E039 Hypothyroidism, unspecified: Secondary | ICD-10-CM | POA: Diagnosis not present

## 2023-07-16 ENCOUNTER — Encounter: Payer: Self-pay | Admitting: Nurse Practitioner

## 2023-07-16 NOTE — Telephone Encounter (Signed)
 Care team updated and letter sent for eye exam notes.

## 2023-07-23 ENCOUNTER — Telehealth: Payer: Self-pay

## 2023-07-23 NOTE — Telephone Encounter (Signed)
Spoke with patient.  She only wants to have her A1C done and is okay with doing it the day she comes in for her appointment with Gennette Pac on 10/12/23.  No further appointments needed.  Copied from CRM [id number]. Topic: Clinical - Request for Lab/Test Order >> Jul 23, 2023  9:13 AM Prudencio Pair wrote: Reason for CRM: Patient states she is needing lab work completed before her appt on Feb. 25th. Was able to get her schedule. She stated this is needed for her diabetes and also wants to see if cholesterol can be checked as well.

## 2023-08-12 DIAGNOSIS — E109 Type 1 diabetes mellitus without complications: Secondary | ICD-10-CM | POA: Diagnosis not present

## 2023-08-12 DIAGNOSIS — Z794 Long term (current) use of insulin: Secondary | ICD-10-CM | POA: Diagnosis not present

## 2023-10-08 ENCOUNTER — Other Ambulatory Visit: Payer: BC Managed Care – PPO

## 2023-10-12 ENCOUNTER — Ambulatory Visit: Payer: BC Managed Care – PPO | Admitting: Nurse Practitioner

## 2023-10-12 ENCOUNTER — Encounter: Payer: Self-pay | Admitting: Nurse Practitioner

## 2023-10-12 VITALS — BP 126/75 | HR 73 | Temp 98.2°F | Ht 67.0 in | Wt 151.0 lb

## 2023-10-12 DIAGNOSIS — E039 Hypothyroidism, unspecified: Secondary | ICD-10-CM | POA: Diagnosis not present

## 2023-10-12 DIAGNOSIS — I10 Essential (primary) hypertension: Secondary | ICD-10-CM | POA: Diagnosis not present

## 2023-10-12 DIAGNOSIS — E1169 Type 2 diabetes mellitus with other specified complication: Secondary | ICD-10-CM | POA: Diagnosis not present

## 2023-10-12 DIAGNOSIS — F5101 Primary insomnia: Secondary | ICD-10-CM

## 2023-10-12 DIAGNOSIS — E785 Hyperlipidemia, unspecified: Secondary | ICD-10-CM

## 2023-10-12 DIAGNOSIS — E119 Type 2 diabetes mellitus without complications: Secondary | ICD-10-CM

## 2023-10-12 DIAGNOSIS — K5904 Chronic idiopathic constipation: Secondary | ICD-10-CM | POA: Diagnosis not present

## 2023-10-12 DIAGNOSIS — Z794 Long term (current) use of insulin: Secondary | ICD-10-CM

## 2023-10-12 LAB — BAYER DCA HB A1C WAIVED: HB A1C (BAYER DCA - WAIVED): 7.4 % — ABNORMAL HIGH (ref 4.8–5.6)

## 2023-10-12 LAB — LIPID PANEL

## 2023-10-12 MED ORDER — LOSARTAN POTASSIUM 50 MG PO TABS: 50.0000 mg | ORAL_TABLET | Freq: Every day | ORAL | 1 refills | Status: DC

## 2023-10-12 MED ORDER — LEVOTHYROXINE SODIUM 125 MCG PO TABS: 125.0000 ug | ORAL_TABLET | Freq: Every day | ORAL | 1 refills | Status: DC

## 2023-10-12 MED ORDER — SIMVASTATIN 40 MG PO TABS
40.0000 mg | ORAL_TABLET | Freq: Every day | ORAL | 1 refills | Status: DC
Start: 2023-10-12 — End: 2024-01-04

## 2023-10-12 MED ORDER — NOVOLOG 100 UNIT/ML IJ SOLN: INTRAMUSCULAR | 11 refills | Status: DC

## 2023-10-12 MED ORDER — ZOLPIDEM TARTRATE 10 MG PO TABS: 10.0000 mg | ORAL_TABLET | Freq: Every evening | ORAL | 1 refills | Status: DC | PRN

## 2023-10-12 NOTE — Progress Notes (Signed)
 Subjective:    Patient ID: Alejandra Mcdonald, female    DOB: 1960-11-14, 63 y.o.   MRN: [id number]   Chief Complaint: medical management of chronic issues     HPI:  Alejandra Mcdonald is a 63 y.o. who identifies as a female who was assigned female at birth.   Social history: Lives with: husband Work history: retired   Water engineer in today for follow up of the following chronic medical issues:  1. Primary hypertension NO c/o chest pain, sob or headache. Does not check blood pressure at home very often. BP Readings from Last 3 Encounters:  04/09/23 118/76  01/30/23 (!) 144/102  01/28/23 138/73      2. Acquired hypothyroidism No issues that she is aware of. We did not change dose of meds at last visit. Lab Results  Component Value Date   TSH 0.093 (L) 04/09/2023     3. Hyperlipidemia associated with type 2 diabetes mellitus (HCC) Does try to watch her diet and stays very active.  Lab Results  Component Value Date   CHOL 206 (H) 04/09/2023   HDL 94 04/09/2023   LDLCALC 94 04/09/2023   TRIG 106 04/09/2023   CHOLHDL 2.2 04/09/2023      4. Insulin-requiring or dependent type II diabetes mellitus (HCC) She has an insulin pump. She has been on a pump since she was a teenager. Is does well keeping control of her blood sugar. Lab Results  Component Value Date   HGBA1C 7.2 (H) 04/09/2023    5. Chronic idiopathic constipation Has had constipation and keeps it under control with OTC meds. Has gotten better since she retired.   New complaints: Not sleeping. Having trouble staying asleep- wakes up 5-6 times a night.   Allergies  Allergen Reactions   Ace Inhibitors Cough   Baclofen Other (See Comments)    Raises blood sugar   Codeine Nausea And Vomiting   Lipitor [Atorvastatin] Other (See Comments)    Myalgias   Outpatient Encounter Medications as of 10/12/2023  Medication Sig   albuterol (PROVENTIL) (2.5 MG/3ML) 0.083% nebulizer solution Take 3 mLs (2.5 mg total) by  nebulization every 6 (six) hours as needed for wheezing or shortness of breath.   albuterol (VENTOLIN HFA) 108 (90 Base) MCG/ACT inhaler Inhale 2 puffs into the lungs every 6 (six) hours as needed for wheezing or shortness of breath.   benzonatate (TESSALON PERLES) 100 MG capsule Take 1 capsule (100 mg total) by mouth 3 (three) times daily as needed for cough.   Calcium Citrate-Vitamin D (CALCIUM + D PO) Take 1 tablet by mouth daily.    Continuous Blood Gluc Receiver (FREESTYLE LIBRE 14 DAY READER) DEVI 1 Device by Does not apply route daily.   Continuous Blood Gluc Sensor (FREESTYLE LIBRE 14 DAY SENSOR) MISC Apply sensor every 14 days. DX: E10.9   HYDROcodone bit-homatropine (HYCODAN) 5-1.5 MG/5ML syrup Take 5 mLs by mouth every 6 (six) hours as needed for cough.   Insulin Human (INSULIN PUMP) SOLN Inject into the skin See admin instructions. Novolog Insulin USE UP TO 100 UNITS VIA PUMP PER DAY   levothyroxine (SYNTHROID) 125 MCG tablet Take 1 tablet (125 mcg total) by mouth daily.   losartan (COZAAR) 50 MG tablet Take 1 tablet (50 mg total) by mouth daily.   magnesium hydroxide (MILK OF MAGNESIA) 400 MG/5ML suspension Take 30 mLs by mouth once a week.   Multiple Vitamin (MULTIVITAMIN WITH MINERALS) TABS tablet Take 1 tablet by mouth in the morning  and at bedtime.   NOVOLOG 100 UNIT/ML injection Per insulin pump up to 100u a day as needed   predniSONE (DELTASONE) 20 MG tablet 2 po at sametime daily for 5 days-   simvastatin (ZOCOR) 40 MG tablet Take 1 tablet (40 mg total) by mouth daily at 6 PM. (Needs to be seen before next refill)   No facility-administered encounter medications on file as of 10/12/2023.    Past Surgical History:  Procedure Laterality Date   ANTERIOR CERVICAL DECOMPRESSION/DISCECTOMY FUSION 4 LEVELS N/A 04/24/2020   Procedure: ANTERIOR CERVICAL DECOMPRESSION FUSION CERVICAL 4-5, CERVICAL 5-6, CERVICAL 6-7 WITH INSTRUMENTATION AND ALLOGRAFT;  Surgeon: Estill Bamberg, MD;   Location: [facility];  Service: Orthopedics;  Laterality: N/A;   APPENDECTOMY     BACK SURGERY  03/2015   BILATERAL CARPAL TUNNEL RELEASE     BIOPSY  03/22/2019   Procedure: BIOPSY;  Surgeon: Malissa Hippo, MD;  [facility]: [facility];  Service: Endoscopy;;  ascending colon polyp biopsy   CATARACT EXTRACTION Bilateral may & june 2017   CESAREAN SECTION     COLONOSCOPY N/A 03/22/2019   Procedure: COLONOSCOPY;  Surgeon: Malissa Hippo, MD;  [facility]: [facility];  Service: Endoscopy;  Laterality: N/A;  930   POLYPECTOMY  03/22/2019   Procedure: POLYPECTOMY;  Surgeon: Malissa Hippo, MD;  [facility]: [facility];  Service: Endoscopy;;  ascending colon, hepatic flexure   TUBAL LIGATION      Family History  Problem Relation Age of Onset   Hypertension Mother    Diabetes Mother    Hypertension Father    Healthy Brother    Healthy Brother    Healthy Brother    Breast cancer Neg Hx       Controlled substance contract: n/a     Review of Systems  Constitutional:  Negative for diaphoresis.  Eyes:  Negative for pain.  Respiratory:  Positive for cough (non productive). Negative for shortness of breath.   Cardiovascular:  Negative for chest pain, palpitations and leg swelling.  Gastrointestinal:  Negative for abdominal pain.  Endocrine: Negative for polydipsia.  Skin:  Negative for rash.  Neurological:  Negative for dizziness, weakness and headaches.  Hematological:  Does not bruise/bleed easily.  All other systems reviewed and are negative.      Objective:   Physical Exam Vitals and nursing note reviewed.  Constitutional:      General: She is not in acute distress.    Appearance: Normal appearance. She is well-developed.  HENT:     Head: Normocephalic.     Right Ear: Tympanic membrane normal.     Left Ear: Tympanic membrane normal.     Nose: Nose normal.     Mouth/Throat:     Mouth: Mucous membranes are moist.  Eyes:     Pupils: Pupils are equal, round, and reactive to  light.  Neck:     Vascular: No carotid bruit or JVD.  Cardiovascular:     Rate and Rhythm: Normal rate and regular rhythm.     Heart sounds: Normal heart sounds.  Pulmonary:     Effort: Pulmonary effort is normal. No respiratory distress.     Breath sounds: Normal breath sounds. No wheezing or rales.  Chest:     Chest wall: No tenderness.  Abdominal:     General: Bowel sounds are normal. There is no distension or abdominal bruit.     Palpations: Abdomen is soft. There is no hepatomegaly, splenomegaly, mass or pulsatile mass.  Tenderness: There is no abdominal tenderness.  Musculoskeletal:        General: Normal range of motion.     Cervical back: Normal range of motion and neck supple.  Lymphadenopathy:     Cervical: No cervical adenopathy.  Skin:    General: Skin is warm and dry.  Neurological:     Mental Status: She is alert and oriented to person, place, and time.     Deep Tendon Reflexes: Reflexes are normal and symmetric.  Psychiatric:        Behavior: Behavior normal.        Thought Content: Thought content normal.        Judgment: Judgment normal.    BP 126/75   Pulse 73   Temp 98.2 F (36.8 C) (Temporal)   Ht 5\' 7"  (1.702 m)   Wt 151 lb (68.5 kg)   SpO2 97%   BMI 23.65 kg/m     HGBA1c - 7.4%    Assessment & Plan:   Beckham L Brunette comes in today with chief complaint of medical management of chronic issues    Diagnosis and orders addressed:  1. Acquired hypothyroidism Labs pending - Thyroid Panel With TSH  2. Hyperlipidemia associated with type 2 diabetes mellitus (HCC) Low fat diet - CBC with Differential/Platelet - CMP14+EGFR - Lipid panel - simvastatin (ZOCOR) 40 MG tablet; Take 1 tablet (40 mg total) by mouth daily at 6 PM. (Needs to be seen before next refill)  Dispense: 90 tablet; Refill: 1  3. Insulin-requiring or dependent type II diabetes mellitus (HCC) Continue current diet - Bayer DCA Hb A1c Waived - NOVOLOG 100 UNIT/ML injection;  Per insulin pump  Dispense: 30 mL; Refill: 11  4. Chronic idiopathic constipation Continue increase in fiber  5. Primary hypertension Low sodium diet - losartan (COZAAR) 50 MG tablet; Take 1 tablet (50 mg total) by mouth daily.  Dispense: 90 tablet; Refill: 1   Labs pending Health Maintenance reviewed Diet and exercise encouraged  Follow up plan: 6 months   Mary-Margaret Daphine Deutscher, FNP

## 2023-10-13 LAB — CBC WITH DIFFERENTIAL/PLATELET
Basophils Absolute: 0.1 10*3/uL (ref 0.0–0.2)
Basos: 2 %
EOS (ABSOLUTE): 0.2 10*3/uL (ref 0.0–0.4)
Eos: 4 %
Hematocrit: 41.8 % (ref 34.0–46.6)
Hemoglobin: 14.1 g/dL (ref 11.1–15.9)
Immature Grans (Abs): 0 10*3/uL (ref 0.0–0.1)
Immature Granulocytes: 0 %
Lymphocytes Absolute: 1.5 10*3/uL (ref 0.7–3.1)
Lymphs: 34 %
MCH: 31.4 pg (ref 26.6–33.0)
MCHC: 33.7 g/dL (ref 31.5–35.7)
MCV: 93 fL (ref 79–97)
Monocytes Absolute: 0.3 10*3/uL (ref 0.1–0.9)
Monocytes: 8 %
Neutrophils Absolute: 2.2 10*3/uL (ref 1.4–7.0)
Neutrophils: 52 %
Platelets: 327 10*3/uL (ref 150–450)
RBC: 4.49 x10E6/uL (ref 3.77–5.28)
RDW: 12.1 % (ref 11.7–15.4)
WBC: 4.3 10*3/uL (ref 3.4–10.8)

## 2023-10-13 LAB — CMP14+EGFR
ALT: 22 IU/L (ref 0–32)
AST: 22 IU/L (ref 0–40)
Albumin: 4.4 g/dL (ref 3.9–4.9)
Alkaline Phosphatase: 74 IU/L (ref 44–121)
BUN/Creatinine Ratio: 13 (ref 12–28)
BUN: 11 mg/dL (ref 8–27)
Bilirubin Total: 0.5 mg/dL (ref 0.0–1.2)
CO2: 25 mmol/L (ref 20–29)
Calcium: 9.6 mg/dL (ref 8.7–10.3)
Chloride: 97 mmol/L (ref 96–106)
Creatinine, Ser: 0.84 mg/dL (ref 0.57–1.00)
Globulin, Total: 1.7 g/dL (ref 1.5–4.5)
Glucose: 227 mg/dL — ABNORMAL HIGH (ref 70–99)
Potassium: 4.4 mmol/L (ref 3.5–5.2)
Sodium: 136 mmol/L (ref 134–144)
Total Protein: 6.1 g/dL (ref 6.0–8.5)
eGFR: 78 mL/min/{1.73_m2} (ref >59)

## 2023-10-13 LAB — LIPID PANEL
Cholesterol, Total: 244 mg/dL — ABNORMAL HIGH (ref 100–199)
HDL: 101 mg/dL (ref >39)
LDL CALC COMMENT:: 2.4 ratio (ref 0.0–4.4)
LDL Chol Calc (NIH): 123 mg/dL — ABNORMAL HIGH (ref 0–99)
Triglycerides: 119 mg/dL (ref 0–149)
VLDL Cholesterol Cal: 20 mg/dL (ref 5–40)

## 2023-10-13 LAB — THYROID PANEL WITH TSH
Free Thyroxine Index: 3.3 (ref 1.2–4.9)
T3 Uptake Ratio: 32 % (ref 24–39)
T4, Total: 10.2 ug/dL (ref 4.5–12.0)
TSH: 0.109 u[IU]/mL — ABNORMAL LOW (ref 0.450–4.500)

## 2023-10-13 LAB — MICROALBUMIN / CREATININE URINE RATIO
Creatinine, Urine: 79 mg/dL
Microalb/Creat Ratio: 5 mg/g{creat} (ref 0–29)
Microalbumin, Urine: 3.7 ug/mL

## 2023-10-19 DIAGNOSIS — E039 Hypothyroidism, unspecified: Secondary | ICD-10-CM | POA: Diagnosis not present

## 2023-10-19 DIAGNOSIS — I10 Essential (primary) hypertension: Secondary | ICD-10-CM | POA: Diagnosis not present

## 2023-10-19 DIAGNOSIS — E1065 Type 1 diabetes mellitus with hyperglycemia: Secondary | ICD-10-CM | POA: Diagnosis not present

## 2023-10-19 DIAGNOSIS — E108 Type 1 diabetes mellitus with unspecified complications: Secondary | ICD-10-CM | POA: Diagnosis not present

## 2023-10-26 DIAGNOSIS — M5412 Radiculopathy, cervical region: Secondary | ICD-10-CM | POA: Diagnosis not present

## 2023-11-22 DIAGNOSIS — M542 Cervicalgia: Secondary | ICD-10-CM | POA: Diagnosis not present

## 2023-11-22 DIAGNOSIS — M6281 Muscle weakness (generalized): Secondary | ICD-10-CM | POA: Diagnosis not present

## 2023-11-24 DIAGNOSIS — M6281 Muscle weakness (generalized): Secondary | ICD-10-CM | POA: Diagnosis not present

## 2023-11-24 DIAGNOSIS — M542 Cervicalgia: Secondary | ICD-10-CM | POA: Diagnosis not present

## 2023-11-30 DIAGNOSIS — M542 Cervicalgia: Secondary | ICD-10-CM | POA: Diagnosis not present

## 2023-12-01 DIAGNOSIS — M542 Cervicalgia: Secondary | ICD-10-CM | POA: Diagnosis not present

## 2023-12-01 DIAGNOSIS — M6281 Muscle weakness (generalized): Secondary | ICD-10-CM | POA: Diagnosis not present

## 2023-12-08 DIAGNOSIS — M6281 Muscle weakness (generalized): Secondary | ICD-10-CM | POA: Diagnosis not present

## 2023-12-08 DIAGNOSIS — M542 Cervicalgia: Secondary | ICD-10-CM | POA: Diagnosis not present

## 2023-12-13 DIAGNOSIS — M6281 Muscle weakness (generalized): Secondary | ICD-10-CM | POA: Diagnosis not present

## 2023-12-13 DIAGNOSIS — M542 Cervicalgia: Secondary | ICD-10-CM | POA: Diagnosis not present

## 2023-12-15 DIAGNOSIS — M6281 Muscle weakness (generalized): Secondary | ICD-10-CM | POA: Diagnosis not present

## 2023-12-15 DIAGNOSIS — M542 Cervicalgia: Secondary | ICD-10-CM | POA: Diagnosis not present

## 2023-12-16 DIAGNOSIS — Z794 Long term (current) use of insulin: Secondary | ICD-10-CM | POA: Diagnosis not present

## 2023-12-16 DIAGNOSIS — E109 Type 1 diabetes mellitus without complications: Secondary | ICD-10-CM | POA: Diagnosis not present

## 2023-12-20 DIAGNOSIS — M6281 Muscle weakness (generalized): Secondary | ICD-10-CM | POA: Diagnosis not present

## 2023-12-20 DIAGNOSIS — M542 Cervicalgia: Secondary | ICD-10-CM | POA: Diagnosis not present

## 2023-12-22 ENCOUNTER — Other Ambulatory Visit: Payer: Self-pay | Admitting: Nurse Practitioner

## 2023-12-22 DIAGNOSIS — M542 Cervicalgia: Secondary | ICD-10-CM | POA: Diagnosis not present

## 2023-12-22 DIAGNOSIS — M6281 Muscle weakness (generalized): Secondary | ICD-10-CM | POA: Diagnosis not present

## 2023-12-22 DIAGNOSIS — Z1231 Encounter for screening mammogram for malignant neoplasm of breast: Secondary | ICD-10-CM

## 2023-12-27 ENCOUNTER — Ambulatory Visit
Admission: RE | Admit: 2023-12-27 | Discharge: 2023-12-27 | Disposition: A | Discharge status: Home / Self Care | Source: Ambulatory Visit | Attending: Nurse Practitioner | Admitting: Nurse Practitioner

## 2023-12-27 DIAGNOSIS — Z1231 Encounter for screening mammogram for malignant neoplasm of breast: Secondary | ICD-10-CM

## 2023-12-28 DIAGNOSIS — M6281 Muscle weakness (generalized): Secondary | ICD-10-CM | POA: Diagnosis not present

## 2023-12-28 DIAGNOSIS — M542 Cervicalgia: Secondary | ICD-10-CM | POA: Diagnosis not present

## 2023-12-29 DIAGNOSIS — M542 Cervicalgia: Secondary | ICD-10-CM | POA: Diagnosis not present

## 2023-12-29 DIAGNOSIS — M6281 Muscle weakness (generalized): Secondary | ICD-10-CM | POA: Diagnosis not present

## 2023-12-31 ENCOUNTER — Ambulatory Visit: Payer: BC Managed Care – PPO | Admitting: Nurse Practitioner

## 2024-01-04 ENCOUNTER — Ambulatory Visit: Admitting: Nurse Practitioner

## 2024-01-04 ENCOUNTER — Encounter: Payer: Self-pay | Admitting: Nurse Practitioner

## 2024-01-04 VITALS — BP 123/74 | HR 75 | Ht 67.0 in | Wt 153.0 lb

## 2024-01-04 DIAGNOSIS — K5904 Chronic idiopathic constipation: Secondary | ICD-10-CM | POA: Diagnosis not present

## 2024-01-04 DIAGNOSIS — E109 Type 1 diabetes mellitus without complications: Secondary | ICD-10-CM | POA: Diagnosis not present

## 2024-01-04 DIAGNOSIS — E785 Hyperlipidemia, unspecified: Secondary | ICD-10-CM | POA: Diagnosis not present

## 2024-01-04 DIAGNOSIS — E1169 Type 2 diabetes mellitus with other specified complication: Secondary | ICD-10-CM

## 2024-01-04 DIAGNOSIS — E119 Type 2 diabetes mellitus without complications: Secondary | ICD-10-CM

## 2024-01-04 DIAGNOSIS — E039 Hypothyroidism, unspecified: Secondary | ICD-10-CM | POA: Diagnosis not present

## 2024-01-04 DIAGNOSIS — Z794 Long term (current) use of insulin: Secondary | ICD-10-CM

## 2024-01-04 DIAGNOSIS — I10 Essential (primary) hypertension: Secondary | ICD-10-CM | POA: Diagnosis not present

## 2024-01-04 DIAGNOSIS — Z0000 Encounter for general adult medical examination without abnormal findings: Secondary | ICD-10-CM

## 2024-01-04 LAB — BAYER DCA HB A1C WAIVED: HB A1C (BAYER DCA - WAIVED): 6.8 % — ABNORMAL HIGH (ref 4.8–5.6)

## 2024-01-04 MED ORDER — NOVOLOG 100 UNIT/ML IJ SOLN: INTRAMUSCULAR | 11 refills | Status: AC

## 2024-01-04 MED ORDER — LOSARTAN POTASSIUM 50 MG PO TABS: 50.0000 mg | ORAL_TABLET | Freq: Every day | ORAL | 1 refills | Status: DC

## 2024-01-04 MED ORDER — SIMVASTATIN 40 MG PO TABS: 40.0000 mg | ORAL_TABLET | Freq: Every day | ORAL | 1 refills | Status: DC

## 2024-01-04 MED ORDER — LEVOTHYROXINE SODIUM 125 MCG PO TABS: 125.0000 ug | ORAL_TABLET | Freq: Every day | ORAL | 1 refills | Status: DC

## 2024-01-04 NOTE — Progress Notes (Signed)
 Subjective:    Patient ID: Alejandra Mcdonald, female    DOB: 11-17-1960, 63 y.o.   MRN: [id number]   Chief Complaint: annual physical     HPI:  Alejandra Mcdonald is a 63 y.o. who identifies as a female who was assigned female at birth.   Social history: Lives with: husband Work history: retired   Water engineer in today for follow up of the following chronic medical issues:  1. Primary hypertension NO c/o chest pain, sob or headache. Does not check blood pressure at home very often. BP Readings from Last 3 Encounters:  01/04/24 123/74  10/12/23 126/75  04/09/23 118/76      2. Acquired hypothyroidism No issues that she is aware of. We did not change dose of meds at last visit. Lab Results  Component Value Date   TSH 0.109 (L) 10/12/2023     3. Hyperlipidemia associated with type 2 diabetes mellitus (HCC) Does try to watch her diet and stays very active.  Lab Results  Component Value Date   CHOL 244 (H) 10/12/2023   HDL 101 10/12/2023   LDLCALC 123 (H) 10/12/2023   TRIG 119 10/12/2023   CHOLHDL 2.4 10/12/2023      4. Insulin -requiring or dependent type II diabetes mellitus (HCC) She has an insulin  pump. She has been on a pump since she was a teenager. Is does well keeping control of her blood sugar. Has new insulin  pump and sensor. The new sensor does not work as good as her old one does. Sensor never reads what her finger stick does. She wants to go back to [place]. Lab Results  Component Value Date   HGBA1C 7.4 (H) 10/12/2023    5. Chronic idiopathic constipation Has had constipation and keeps it under control with OTC meds. Has gotten better since she retired.   New complaints: None today  Allergies  Allergen Reactions   Ace Inhibitors Cough   Baclofen Other (See Comments)    Raises blood sugar   Codeine Nausea And Vomiting   Lipitor [Atorvastatin] Other (See Comments)    Myalgias   Outpatient Encounter Medications as of 01/04/2024  Medication Sig   albuterol   (PROVENTIL ) (2.5 MG/3ML) 0.083% nebulizer solution Take 3 mLs (2.5 mg total) by nebulization every 6 (six) hours as needed for wheezing or shortness of breath.   Calcium  Citrate-Vitamin D  (CALCIUM  + D PO) Take 1 tablet by mouth daily.    Insulin  Human (INSULIN  PUMP) SOLN Inject into the skin See admin instructions. Novolog  Insulin  USE UP TO 100 UNITS VIA PUMP PER DAY   levothyroxine  (SYNTHROID ) 125 MCG tablet Take 1 tablet (125 mcg total) by mouth daily.   losartan  (COZAAR ) 50 MG tablet Take 1 tablet (50 mg total) by mouth daily.   Multiple Vitamin (MULTIVITAMIN WITH MINERALS) TABS tablet Take 1 tablet by mouth in the morning and at bedtime.   NOVOLOG  100 UNIT/ML injection Per insulin  pump up to 100u a day as needed   simvastatin  (ZOCOR ) 40 MG tablet Take 1 tablet (40 mg total) by mouth daily at 6 PM. (Needs to be seen before next refill)   [DISCONTINUED] zolpidem  (AMBIEN ) 10 MG tablet Take 1 tablet (10 mg total) by mouth at bedtime as needed for sleep.   No facility-administered encounter medications on file as of 01/04/2024.    Past Surgical History:  Procedure Laterality Date   ANTERIOR CERVICAL DECOMPRESSION/DISCECTOMY FUSION 4 LEVELS N/A 04/24/2020   Procedure: ANTERIOR CERVICAL DECOMPRESSION FUSION CERVICAL 4-5, CERVICAL 5-6, CERVICAL  6-7 WITH INSTRUMENTATION AND ALLOGRAFT;  Surgeon: Virl Grimes, MD;  Location: [facility];  Service: Orthopedics;  Laterality: N/A;   APPENDECTOMY     BACK SURGERY  03/2015   BILATERAL CARPAL TUNNEL RELEASE     BIOPSY  03/22/2019   Procedure: BIOPSY;  Surgeon: Ruby Corporal, MD;  [facility];  Service: Endoscopy;;  ascending colon polyp biopsy   CATARACT EXTRACTION Bilateral may & june 2017   CESAREAN SECTION     COLONOSCOPY N/A 03/22/2019   Procedure: COLONOSCOPY;  Surgeon: Ruby Corporal, MD;  [facility];  Service: Endoscopy;  Laterality: N/A;  930   POLYPECTOMY  03/22/2019   Procedure: POLYPECTOMY;  Surgeon: Ruby Corporal, MD;   [facility];  Service: Endoscopy;;  ascending colon, hepatic flexure   TUBAL LIGATION      Family History  Problem Relation Age of Onset   Hypertension Mother    Diabetes Mother    Hypertension Father    Healthy Brother    Healthy Brother    Healthy Brother    Breast cancer Neg Hx       Controlled substance contract: n/a     Review of Systems  Constitutional:  Negative for diaphoresis.  Eyes:  Negative for pain.  Respiratory:  Positive for cough (non productive). Negative for shortness of breath.   Cardiovascular:  Negative for chest pain, palpitations and leg swelling.  Gastrointestinal:  Negative for abdominal pain.  Endocrine: Negative for polydipsia.  Skin:  Negative for rash.  Neurological:  Negative for dizziness, weakness and headaches.  Hematological:  Does not bruise/bleed easily.  All other systems reviewed and are negative.      Objective:   Physical Exam Vitals and nursing note reviewed.  Constitutional:      General: She is not in acute distress.    Appearance: Normal appearance. She is well-developed.  HENT:     Head: Normocephalic.     Right Ear: Tympanic membrane normal.     Left Ear: Tympanic membrane normal.     Nose: Nose normal.     Mouth/Throat:     Mouth: Mucous membranes are moist.  Eyes:     Pupils: Pupils are equal, round, and reactive to light.  Neck:     Vascular: No carotid bruit or JVD.  Cardiovascular:     Rate and Rhythm: Normal rate and regular rhythm.     Heart sounds: Normal heart sounds.  Pulmonary:     Effort: Pulmonary effort is normal. No respiratory distress.     Breath sounds: Normal breath sounds. No wheezing or rales.  Chest:     Chest wall: No tenderness.  Abdominal:     General: Bowel sounds are normal. There is no distension or abdominal bruit.     Palpations: Abdomen is soft. There is no hepatomegaly, splenomegaly, mass or pulsatile mass.     Tenderness: There is no abdominal tenderness.   Musculoskeletal:        General: Normal range of motion.     Cervical back: Normal range of motion and neck supple.  Lymphadenopathy:     Cervical: No cervical adenopathy.  Skin:    General: Skin is warm and dry.  Neurological:     Mental Status: She is alert and oriented to person, place, and time.     Deep Tendon Reflexes: Reflexes are normal and symmetric.  Psychiatric:        Behavior: Behavior normal.  Thought Content: Thought content normal.        Judgment: Judgment normal.    BP 123/74   Pulse 75   Ht 5\' 7"  (1.702 m)   Wt 153 lb (69.4 kg)   SpO2 96%   BMI 23.96 kg/m     HGBA1c - 6.8%    Assessment & Plan:   Alejandra Mcdonald comes in today with chief complaint of annual physical   Diagnosis and orders addressed:  1. Acquired hypothyroidism Labs pending - Thyroid  Panel With TSH  2. Hyperlipidemia associated with type 2 diabetes mellitus (HCC) Low fat diet - CBC with Differential/Platelet - CMP14+EGFR - Lipid panel - simvastatin  (ZOCOR ) 40 MG tablet; Take 1 tablet (40 mg total) by mouth daily at 6 PM. (Needs to be seen before next refill)  Dispense: 90 tablet; Refill: 1  3. Insulin -requiring or dependent type II diabetes mellitus (HCC) Continue current diet - Bayer DCA Hb A1c Waived - NOVOLOG  100 UNIT/ML injection; Per insulin  pump  Dispense: 30 mL; Refill: 11  4. Chronic idiopathic constipation Continue increase in fiber  5. Primary hypertension Low sodium diet - losartan  (COZAAR ) 50 MG tablet; Take 1 tablet (50 mg total) by mouth daily.  Dispense: 90 tablet; Refill: 1   Labs pending Health Maintenance reviewed Diet and exercise encouraged  Follow up plan: 6 months   Mary-Margaret Gaylyn Keas, FNP

## 2024-01-05 LAB — CMP14+EGFR
ALT: 19 IU/L (ref 0–32)
AST: 24 IU/L (ref 0–40)
Albumin: 4.1 g/dL (ref 3.9–4.9)
Alkaline Phosphatase: 80 IU/L (ref 44–121)
BUN/Creatinine Ratio: 15 (ref 12–28)
BUN: 11 mg/dL (ref 8–27)
Bilirubin Total: 0.3 mg/dL (ref 0.0–1.2)
CO2: 23 mmol/L (ref 20–29)
Calcium: 10.4 mg/dL — ABNORMAL HIGH (ref 8.7–10.3)
Chloride: 100 mmol/L (ref 96–106)
Creatinine, Ser: 0.73 mg/dL (ref 0.57–1.00)
Globulin, Total: 1.8 g/dL (ref 1.5–4.5)
Glucose: 120 mg/dL — ABNORMAL HIGH (ref 70–99)
Potassium: 4.7 mmol/L (ref 3.5–5.2)
Sodium: 139 mmol/L (ref 134–144)
Total Protein: 5.9 g/dL — ABNORMAL LOW (ref 6.0–8.5)
eGFR: 92 mL/min/{1.73_m2} (ref >59)

## 2024-01-05 LAB — CBC WITH DIFFERENTIAL/PLATELET
Basophils Absolute: 0.1 10*3/uL (ref 0.0–0.2)
Basos: 1 %
EOS (ABSOLUTE): 0.1 10*3/uL (ref 0.0–0.4)
Eos: 3 %
Hematocrit: 43 % (ref 34.0–46.6)
Hemoglobin: 14.5 g/dL (ref 11.1–15.9)
Immature Grans (Abs): 0 10*3/uL (ref 0.0–0.1)
Immature Granulocytes: 0 %
Lymphocytes Absolute: 1.6 10*3/uL (ref 0.7–3.1)
Lymphs: 31 %
MCH: 32.2 pg (ref 26.6–33.0)
MCHC: 33.7 g/dL (ref 31.5–35.7)
MCV: 95 fL (ref 79–97)
Monocytes Absolute: 0.4 10*3/uL (ref 0.1–0.9)
Monocytes: 7 %
Neutrophils Absolute: 3.1 10*3/uL (ref 1.4–7.0)
Neutrophils: 58 %
Platelets: 317 10*3/uL (ref 150–450)
RBC: 4.51 x10E6/uL (ref 3.77–5.28)
RDW: 11.8 % (ref 11.7–15.4)
WBC: 5.3 10*3/uL (ref 3.4–10.8)

## 2024-01-05 LAB — LIPID PANEL
Chol/HDL Ratio: 2.1 ratio (ref 0.0–4.4)
Cholesterol, Total: 217 mg/dL — ABNORMAL HIGH (ref 100–199)
HDL: 101 mg/dL (ref >39)
LDL Chol Calc (NIH): 105 mg/dL — ABNORMAL HIGH (ref 0–99)
Triglycerides: 64 mg/dL (ref 0–149)
VLDL Cholesterol Cal: 11 mg/dL (ref 5–40)

## 2024-01-05 LAB — THYROID PANEL WITH TSH
Free Thyroxine Index: 2.5 (ref 1.2–4.9)
T3 Uptake Ratio: 28 % (ref 24–39)
T4, Total: 9.1 ug/dL (ref 4.5–12.0)
TSH: 0.08 u[IU]/mL — ABNORMAL LOW (ref 0.450–4.500)

## 2024-01-13 ENCOUNTER — Ambulatory Visit: Payer: Self-pay | Admitting: Nurse Practitioner

## 2024-01-13 MED ORDER — LEVOTHYROXINE SODIUM 112 MCG PO TABS: 112.0000 ug | ORAL_TABLET | Freq: Every day | ORAL | 3 refills | Status: AC

## 2024-01-20 DIAGNOSIS — E1065 Type 1 diabetes mellitus with hyperglycemia: Secondary | ICD-10-CM | POA: Diagnosis not present

## 2024-01-20 DIAGNOSIS — E782 Mixed hyperlipidemia: Secondary | ICD-10-CM | POA: Diagnosis not present

## 2024-01-20 DIAGNOSIS — I10 Essential (primary) hypertension: Secondary | ICD-10-CM | POA: Diagnosis not present

## 2024-01-20 DIAGNOSIS — E039 Hypothyroidism, unspecified: Secondary | ICD-10-CM | POA: Diagnosis not present

## 2024-02-07 DIAGNOSIS — H3561 Retinal hemorrhage, right eye: Secondary | ICD-10-CM | POA: Diagnosis not present

## 2024-04-11 DIAGNOSIS — M5412 Radiculopathy, cervical region: Secondary | ICD-10-CM | POA: Diagnosis not present

## 2024-04-12 ENCOUNTER — Other Ambulatory Visit: Payer: Self-pay | Admitting: Orthopedic Surgery

## 2024-04-12 DIAGNOSIS — M542 Cervicalgia: Secondary | ICD-10-CM

## 2024-04-13 DIAGNOSIS — M542 Cervicalgia: Secondary | ICD-10-CM | POA: Diagnosis not present

## 2024-04-20 ENCOUNTER — Other Ambulatory Visit

## 2024-04-20 DIAGNOSIS — E109 Type 1 diabetes mellitus without complications: Secondary | ICD-10-CM | POA: Diagnosis not present

## 2024-04-20 DIAGNOSIS — Z794 Long term (current) use of insulin: Secondary | ICD-10-CM | POA: Diagnosis not present

## 2024-05-01 DIAGNOSIS — M5412 Radiculopathy, cervical region: Secondary | ICD-10-CM | POA: Diagnosis not present

## 2024-05-03 DIAGNOSIS — G5602 Carpal tunnel syndrome, left upper limb: Secondary | ICD-10-CM | POA: Diagnosis not present

## 2024-05-03 DIAGNOSIS — G5601 Carpal tunnel syndrome, right upper limb: Secondary | ICD-10-CM | POA: Diagnosis not present

## 2024-05-03 DIAGNOSIS — G5603 Carpal tunnel syndrome, bilateral upper limbs: Secondary | ICD-10-CM | POA: Diagnosis not present

## 2024-05-08 NOTE — Discharge Instructions (Signed)

## 2024-05-09 ENCOUNTER — Inpatient Hospital Stay
Admission: RE | Admit: 2024-05-09 | Discharge: 2024-05-09 | Disposition: A | Discharge status: Home / Self Care | Source: Ambulatory Visit | Attending: Orthopedic Surgery | Admitting: Orthopedic Surgery

## 2024-05-09 DIAGNOSIS — M542 Cervicalgia: Secondary | ICD-10-CM

## 2024-05-09 DIAGNOSIS — M47812 Spondylosis without myelopathy or radiculopathy, cervical region: Secondary | ICD-10-CM | POA: Diagnosis not present

## 2024-05-09 MED ORDER — TRIAMCINOLONE ACETONIDE 40 MG/ML IJ SUSP (RADIOLOGY)
60.0000 mg | Freq: Once | INTRAMUSCULAR | Status: AC
  Administered 2024-05-09: 60 mg via EPIDURAL

## 2024-05-09 MED ORDER — IOPAMIDOL (ISOVUE-M 300) INJECTION 61%
1.0000 mL | Freq: Once | INTRAMUSCULAR | Status: AC | PRN
Start: 2024-05-09 — End: 2024-05-09
  Administered 2024-05-09: 1 mL via EPIDURAL

## 2024-05-10 DIAGNOSIS — E109 Type 1 diabetes mellitus without complications: Secondary | ICD-10-CM | POA: Diagnosis not present

## 2024-05-10 DIAGNOSIS — Z794 Long term (current) use of insulin: Secondary | ICD-10-CM | POA: Diagnosis not present

## 2024-05-15 ENCOUNTER — Other Ambulatory Visit

## 2024-05-15 DIAGNOSIS — E1065 Type 1 diabetes mellitus with hyperglycemia: Secondary | ICD-10-CM | POA: Diagnosis not present

## 2024-05-22 DIAGNOSIS — M5412 Radiculopathy, cervical region: Secondary | ICD-10-CM | POA: Diagnosis not present

## 2024-05-23 DIAGNOSIS — E1065 Type 1 diabetes mellitus with hyperglycemia: Secondary | ICD-10-CM | POA: Diagnosis not present

## 2024-05-23 DIAGNOSIS — E039 Hypothyroidism, unspecified: Secondary | ICD-10-CM | POA: Diagnosis not present

## 2024-05-23 DIAGNOSIS — E108 Type 1 diabetes mellitus with unspecified complications: Secondary | ICD-10-CM | POA: Diagnosis not present

## 2024-05-23 DIAGNOSIS — I10 Essential (primary) hypertension: Secondary | ICD-10-CM | POA: Diagnosis not present

## 2024-06-15 DIAGNOSIS — E109 Type 1 diabetes mellitus without complications: Secondary | ICD-10-CM | POA: Diagnosis not present

## 2024-06-20 ENCOUNTER — Ambulatory Visit: Payer: Self-pay | Admitting: Nurse Practitioner

## 2024-06-20 ENCOUNTER — Encounter: Payer: Self-pay | Admitting: Nurse Practitioner

## 2024-06-20 VITALS — BP 134/66 | HR 71 | Temp 98.1°F | Ht 67.0 in | Wt 163.0 lb

## 2024-06-20 DIAGNOSIS — E039 Hypothyroidism, unspecified: Secondary | ICD-10-CM

## 2024-06-20 DIAGNOSIS — K5904 Chronic idiopathic constipation: Secondary | ICD-10-CM

## 2024-06-20 DIAGNOSIS — E1169 Type 2 diabetes mellitus with other specified complication: Secondary | ICD-10-CM | POA: Diagnosis not present

## 2024-06-20 DIAGNOSIS — I10 Essential (primary) hypertension: Secondary | ICD-10-CM

## 2024-06-20 DIAGNOSIS — E782 Mixed hyperlipidemia: Secondary | ICD-10-CM

## 2024-06-20 DIAGNOSIS — F5101 Primary insomnia: Secondary | ICD-10-CM

## 2024-06-20 DIAGNOSIS — M5090 Cervical disc disorder, unspecified, unspecified cervical region: Secondary | ICD-10-CM

## 2024-06-20 DIAGNOSIS — E1069 Type 1 diabetes mellitus with other specified complication: Secondary | ICD-10-CM | POA: Diagnosis not present

## 2024-06-20 DIAGNOSIS — E785 Hyperlipidemia, unspecified: Secondary | ICD-10-CM | POA: Diagnosis not present

## 2024-06-20 LAB — BAYER DCA HB A1C WAIVED: HB A1C (BAYER DCA - WAIVED): 6.5 % — ABNORMAL HIGH (ref 4.8–5.6)

## 2024-06-20 MED ORDER — GABAPENTIN 300 MG PO CAPS: 300.0000 mg | ORAL_CAPSULE | Freq: Three times a day (TID) | ORAL | 1 refills | Status: AC

## 2024-06-20 MED ORDER — TRAZODONE HCL 100 MG PO TABS: 100.0000 mg | ORAL_TABLET | Freq: Every day | ORAL | 1 refills | Status: AC

## 2024-06-20 MED ORDER — SIMVASTATIN 40 MG PO TABS
40.0000 mg | ORAL_TABLET | Freq: Every day | ORAL | 1 refills | Status: AC
Start: 2024-06-20 — End: ?

## 2024-06-20 NOTE — Progress Notes (Signed)
 Subjective:    Patient ID: Alejandra Mcdonald, female    DOB: 08-02-1961, 63 y.o.   MRN: [id number]   Chief Complaint: medical management of chronic issues     HPI:  Alejandra Mcdonald is a 63 y.o. who identifies as a female who was assigned female at birth.   Social history: Lives with: husband Work history: retired   Water Engineer in today for follow up of the following chronic medical issues:  1. Primary hypertension NO c/o chest pain, sob or headache. Does not check blood pressure at home very often. Endocriniology increased losartan  to 100mg  daily BP Readings from Last 3 Encounters:  05/09/24 (!) 165/71  01/04/24 123/74  10/12/23 126/75      2. Acquired hypothyroidism No issues that she is aware of. We did not change dose of meds at last visit. Lab Results  Component Value Date   TSH 0.080 (L) 01/04/2024     3. Hyperlipidemia associated with type 1 diabetes mellitus (HCC) Does try to watch her diet and stays very active.  Lab Results  Component Value Date   CHOL 217 (H) 01/04/2024   HDL 101 01/04/2024   LDLCALC 105 (H) 01/04/2024   TRIG 64 01/04/2024   CHOLHDL 2.1 01/04/2024   The ASCVD Risk score (Arnett DK, et al., 2019) failed to calculate for the following reasons:   The valid HDL cholesterol range is 20 to 100 mg/dL    4.  Diabetes type 1 with othe rcomplications She has an insulin  pump. She has been on a pump since she was a teenager. Is does well keeping control of her blood sugar. Sees endocrinology every 6 months Lab Results  Component Value Date   HGBA1C 6.8 (H) 01/04/2024    5. Chronic idiopathic constipation Has had constipation and keeps it under control with OTC meds. Has gotten better since she retired.   New complaints: Has ruptured disc in back and neurologist put her on gabapentin 300mg  tid but usually tries to take no more then 2 times a day. Insomnia- has been on ambien  for some time. Buy she has tried her husbands trazadone and that  works better for her.  Allergies  Allergen Reactions   Ace Inhibitors Cough   Baclofen Other (See Comments)    Raises blood sugar   Codeine Nausea And Vomiting   Lipitor [Atorvastatin] Other (See Comments)    Myalgias   Outpatient Encounter Medications as of 06/20/2024  Medication Sig   albuterol  (PROVENTIL ) (2.5 MG/3ML) 0.083% nebulizer solution Take 3 mLs (2.5 mg total) by nebulization every 6 (six) hours as needed for wheezing or shortness of breath.   Calcium  Citrate-Vitamin D  (CALCIUM  + D PO) Take 1 tablet by mouth daily.    Insulin  Human (INSULIN  PUMP) SOLN Inject into the skin See admin instructions. Novolog  Insulin  USE UP TO 100 UNITS VIA PUMP PER DAY   levothyroxine  (SYNTHROID ) 112 MCG tablet Take 1 tablet (112 mcg total) by mouth daily.   losartan  (COZAAR ) 50 MG tablet Take 1 tablet (50 mg total) by mouth daily.   Multiple Vitamin (MULTIVITAMIN WITH MINERALS) TABS tablet Take 1 tablet by mouth in the morning and at bedtime.   NOVOLOG  100 UNIT/ML injection Per insulin  pump up to 100u a day as needed   simvastatin  (ZOCOR ) 40 MG tablet Take 1 tablet (40 mg total) by mouth daily at 6 PM. (Needs to be seen before next refill)   No facility-administered encounter medications on file as of 06/20/2024.  Past Surgical History:  Procedure Laterality Date   ANTERIOR CERVICAL DECOMPRESSION/DISCECTOMY FUSION 4 LEVELS N/A 04/24/2020   Procedure: ANTERIOR CERVICAL DECOMPRESSION FUSION CERVICAL 4-5, CERVICAL 5-6, CERVICAL 6-7 WITH INSTRUMENTATION AND ALLOGRAFT;  Surgeon: Beuford Anes, MD;  Location: [facility];  Service: Orthopedics;  Laterality: N/A;   APPENDECTOMY     BACK SURGERY  03/2015   BILATERAL CARPAL TUNNEL RELEASE     BIOPSY  03/22/2019   Procedure: BIOPSY;  Surgeon: Golda Claudis PENNER, MD;  [facility];  Service: Endoscopy;;  ascending colon polyp biopsy   CATARACT EXTRACTION Bilateral may & june 2017   CESAREAN SECTION     COLONOSCOPY N/A 03/22/2019   Procedure:  COLONOSCOPY;  Surgeon: Golda Claudis PENNER, MD;  [facility];  Service: Endoscopy;  Laterality: N/A;  930   POLYPECTOMY  03/22/2019   Procedure: POLYPECTOMY;  Surgeon: Golda Claudis PENNER, MD;  [facility];  Service: Endoscopy;;  ascending colon, hepatic flexure   TUBAL LIGATION      Family History  Problem Relation Age of Onset   Hypertension Mother    Diabetes Mother    Hypertension Father    Healthy Brother    Healthy Brother    Healthy Brother    Breast cancer Neg Hx       Controlled substance contract: n/a     Review of Systems  Constitutional:  Negative for diaphoresis.  Eyes:  Negative for pain.  Respiratory:  Positive for cough (non productive). Negative for shortness of breath.   Cardiovascular:  Negative for chest pain, palpitations and leg swelling.  Gastrointestinal:  Negative for abdominal pain.  Endocrine: Negative for polydipsia.  Skin:  Negative for rash.  Neurological:  Negative for dizziness, weakness and headaches.  Hematological:  Does not bruise/bleed easily.  All other systems reviewed and are negative.      Objective:   Physical Exam Vitals and nursing note reviewed.  Constitutional:      General: She is not in acute distress.    Appearance: Normal appearance. She is well-developed.  HENT:     Head: Normocephalic.     Right Ear: Tympanic membrane normal.     Left Ear: Tympanic membrane normal.     Nose: Nose normal.     Mouth/Throat:     Mouth: Mucous membranes are moist.  Eyes:     Pupils: Pupils are equal, round, and reactive to light.  Neck:     Vascular: No carotid bruit or JVD.  Cardiovascular:     Rate and Rhythm: Normal rate and regular rhythm.     Heart sounds: Normal heart sounds.  Pulmonary:     Effort: Pulmonary effort is normal. No respiratory distress.     Breath sounds: Normal breath sounds. No wheezing or rales.  Chest:     Chest wall: No tenderness.  Abdominal:     General: Bowel sounds are normal.  There is no distension or abdominal bruit.     Palpations: Abdomen is soft. There is no hepatomegaly, splenomegaly, mass or pulsatile mass.     Tenderness: There is no abdominal tenderness.  Musculoskeletal:        General: Normal range of motion.     Cervical back: Normal range of motion and neck supple.     Comments: FROM of cervical spine  Lymphadenopathy:     Cervical: No cervical adenopathy.  Skin:    General: Skin is warm and dry.  Neurological:     Mental Status:  She is alert and oriented to person, place, and time.     Deep Tendon Reflexes: Reflexes are normal and symmetric.  Psychiatric:        Behavior: Behavior normal.        Thought Content: Thought content normal.        Judgment: Judgment normal.    BP 134/66   Pulse 71   Temp 98.1 F (36.7 C) (Temporal)   Ht 5' 7 (1.702 m)   Wt 163 lb (73.9 kg)   SpO2 96%   BMI 25.53 kg/m      HGBA1c - 6.5%    Assessment & Plan:   Aylssa L Leyba comes in today with chief complaint of medical management of chronic issues    Diagnosis and orders addressed:  1. Acquired hypothyroidism Labs pending - Thyroid  Panel With TSH  2. Hyperlipidemia associated with type 2 diabetes mellitus (HCC) Low fat diet - CBC with Differential/Platelet - CMP14+EGFR - Lipid panel - simvastatin  (ZOCOR ) 40 MG tablet; Take 1 tablet (40 mg total) by mouth daily at 6 PM. (Needs to be seen before next refill)  Dispense: 90 tablet; Refill: 1  3. Insulin -requiring or dependent type II diabetes mellitus (HCC) Continue current diet - Bayer DCA Hb A1c Waived - NOVOLOG  100 UNIT/ML injection; Per insulin  pump  Dispense: 30 mL; Refill: 11  4. Chronic idiopathic constipation Continue increase in fiber  5. Primary hypertension Low sodium diet - losartan  (COZAAR ) 100 MG tablet; Take 1 tablet (100 mg total) by mouth daily.  Dispense: 90 tablet; Refill: 1   Labs pending Health Maintenance reviewed Diet and exercise encouraged  Follow up  plan: 6 months   Mary-Margaret Gladis, FNP

## 2024-06-20 NOTE — Patient Instructions (Signed)
 Gabapentin  Capsules or Tablets What is this medication? GABAPENTIN  (GAB a PEN tin) treats nerve pain. It may also be used to prevent and control seizures in people with epilepsy. It works by calming overactive nerves in your body. This medicine may be used for other purposes; ask your health care provider or pharmacist if you have questions. COMMON BRAND NAME(S): Active-PAC with Gabapentin , Gabarone , Gralise , Neurontin  What should I tell my care team before I take this medication? They need to know if you have any of these conditions: Have or have had depression Kidney disease Lung or breathing disease, such as asthma or COPD Substance use disorder Suicidal thoughts, plans, or attempt An unusual or allergic reaction to gabapentin , other medications, foods, dyes, or preservatives Pregnant or trying to get pregnant Breastfeeding How should I use this medication? Take this medication by mouth with a glass of water. Follow the directions on the prescription label. You can take it with or without food. If it upsets your stomach, take it with food. Take your medication at regular intervals. Do not take it more often than directed. Do not stop taking except on your care team's advice. If you are directed to break the 600 or 800 mg tablets in half as part of your dose, the extra half tablet should be used for the next dose. If you have not used the extra half tablet within 28 days, it should be thrown away. A special MedGuide will be given to you by the pharmacist with each prescription and refill. Be sure to read this information carefully each time. Talk to your care team about the use of this medication in children. While this medication may be prescribed for children as young as 3 years for selected conditions, precautions do apply. Overdosage: If you think you have taken too much of this medicine contact a poison control center or emergency room at once. NOTE: This medicine is only for you. Do not  share this medicine with others. What if I miss a dose? If you miss a dose, take it as soon as you can. If it is almost time for your next dose, take only that dose. Do not take double or extra doses. What may interact with this medication? Alcohol Antihistamines for allergy , cough, and cold Certain medications for anxiety or sleep Certain medications for depression like amitriptyline, fluoxetine, sertraline Certain medications for seizures like phenobarbital, primidone Certain medications for stomach problems General anesthetics like halothane, isoflurane, methoxyflurane, propofol  Local anesthetics like lidocaine , pramoxine, tetracaine Medications that relax muscles for surgery Opioid medications for pain Phenothiazines like chlorpromazine, mesoridazine, prochlorperazine, thioridazine This list may not describe all possible interactions. Give your health care provider a list of all the medicines, herbs, non-prescription drugs, or dietary supplements you use. Also tell them if you smoke, drink alcohol, or use illegal drugs. Some items may interact with your medicine. What should I watch for while using this medication? Visit your care team for regular checks on your progress. Tell your care team if your symptoms do not start to get better or if they get worse. Do not suddenly stop taking this medication. You may develop a severe reaction. Your care team will tell you how much medication to take. If your care team wants you to stop the medication, the dose may be slowly lowered over time to avoid any side effects. This medication may affect your coordination, reaction time, or judgment. Do not drive or operate machinery until you know how this medication affects you. Sit  up or stand slowly to reduce the risk of dizzy or fainting spells. Drinking alcohol with this medication can increase the risk of these side effects. Taking this medication with other CNS depressants can make you too sleepy. This  can make it hard to breathe and stay awake. In some cases, it can cause coma and death. CNS depressants include opioids, benzodiazepines, muscle relaxants, medications for sleep, and alcohol. Talk to your care team about all the medications, vitamins, and supplements you take. They can tell you what is safe to take together. Call emergency services right away if you have slow or shallow breathing, feel dizzy or confused, or have trouble staying awake. This medication can cause new or worsening depression and increase the risk of suicidal thoughts and actions in a small number of people. This can happen while you are taking this medication or after stopping it. Talk to your care team right away if you have changes in mood or behavior or thoughts of self-harm or suicide. They can help you. This medication may cause serious skin reactions. They can happen weeks to months after starting the medication. Talk to your care team right away if you have fevers or flu-like symptoms with a rash. The rash may be red or purple and then turn into blisters or peeling of the skin. Or you might notice a red rash with swelling of the face, lips, or lymph nodes in your neck or under your arms. If you take this medication for seizures, wear a medical ID bracelet or chain. Carry a card that describes your condition. List the medications and doses you take on the card. Talk to your care team if you may be pregnant. There are benefits and risks to taking medications during pregnancy. Your care team can help you find the option that works for you. What side effects may I notice from receiving this medication? Side effects that you should report to your care team as soon as possible: Allergic reactions or angioedema--skin rash, itching or hives, swelling of the face, eyes, lips, tongue, arms, or legs, trouble swallowing or breathing Rash, fever, and swollen lymph nodes Thoughts of suicide or self-harm, worsening mood, feelings of  depression Trouble breathing Unusual changes in mood or behavior in children after use such as trouble concentrating, hostility, or restlessness Side effects that usually do not require medical attention (report these to your care team if they continue or are bothersome): Dizziness Drowsiness Nausea Swelling of the ankles, hands, or feet Vomiting This list may not describe all possible side effects. Call your doctor for medical advice about side effects. You may report side effects to FDA at 1-800-FDA-1088. Where should I keep my medication? Keep out of reach of children and pets. Store at room temperature between 15 and 30 degrees C (59 and 86 degrees F). Get rid of any unused medication after the expiration date. This medication may cause accidental overdose and death if taken by other adults, children, or pets. To get rid of medications that are no longer needed or have expired: Take the medication to a medication take-back program. Check with your pharmacy or law enforcement to find a location. If you cannot return the medication, check the label or package insert to see if the medication should be thrown out in the garbage or flushed down the toilet. If you are not sure, ask your care team. If it is safe to put it in the trash, empty the medication out of the container. Mix the medication  with cat litter, dirt, coffee grounds, or other unwanted substance. Seal the mixture in a bag or container. Put it in the trash. NOTE: This sheet is a summary. It may not cover all possible information. If you have questions about this medicine, talk to your doctor, pharmacist, or health care provider.  2025 [employer]/Gold Standard (2023-12-20 00:00:00)

## 2024-06-21 LAB — CBC WITH DIFFERENTIAL/PLATELET
Basophils Absolute: 0.1 x10E3/uL (ref 0.0–0.2)
Basos: 2 %
EOS (ABSOLUTE): 0.2 x10E3/uL (ref 0.0–0.4)
Eos: 4 %
Hematocrit: 43.7 % (ref 34.0–46.6)
Hemoglobin: 14.6 g/dL (ref 11.1–15.9)
Immature Grans (Abs): 0 x10E3/uL (ref 0.0–0.1)
Immature Granulocytes: 0 %
Lymphocytes Absolute: 1.8 x10E3/uL (ref 0.7–3.1)
Lymphs: 29 %
MCH: 32.2 pg (ref 26.6–33.0)
MCHC: 33.4 g/dL (ref 31.5–35.7)
MCV: 96 fL (ref 79–97)
Monocytes Absolute: 0.5 x10E3/uL (ref 0.1–0.9)
Monocytes: 8 %
Neutrophils Absolute: 3.5 x10E3/uL (ref 1.4–7.0)
Neutrophils: 57 %
Platelets: 333 x10E3/uL (ref 150–450)
RBC: 4.54 x10E6/uL (ref 3.77–5.28)
RDW: 12.3 % (ref 11.7–15.4)
WBC: 6.1 x10E3/uL (ref 3.4–10.8)

## 2024-06-21 LAB — CMP14+EGFR
ALT: 26 IU/L (ref 0–32)
AST: 32 IU/L (ref 0–40)
Albumin: 4.1 g/dL (ref 3.9–4.9)
Alkaline Phosphatase: 62 IU/L (ref 49–135)
BUN/Creatinine Ratio: 11 — ABNORMAL LOW (ref 12–28)
BUN: 9 mg/dL (ref 8–27)
Bilirubin Total: 0.4 mg/dL (ref 0.0–1.2)
CO2: 26 mmol/L (ref 20–29)
Calcium: 9.9 mg/dL (ref 8.7–10.3)
Chloride: 101 mmol/L (ref 96–106)
Creatinine, Ser: 0.79 mg/dL (ref 0.57–1.00)
Globulin, Total: 2 g/dL (ref 1.5–4.5)
Glucose: 82 mg/dL (ref 70–99)
Potassium: 4.5 mmol/L (ref 3.5–5.2)
Sodium: 140 mmol/L (ref 134–144)
Total Protein: 6.1 g/dL (ref 6.0–8.5)
eGFR: 83 mL/min/1.73 (ref >59)

## 2024-06-21 LAB — LIPID PANEL
Chol/HDL Ratio: 2.4 ratio (ref 0.0–4.4)
Cholesterol, Total: 235 mg/dL — ABNORMAL HIGH (ref 100–199)
HDL: 99 mg/dL (ref >39)
LDL Chol Calc (NIH): 124 mg/dL — ABNORMAL HIGH (ref 0–99)
Triglycerides: 69 mg/dL (ref 0–149)
VLDL Cholesterol Cal: 12 mg/dL (ref 5–40)

## 2024-06-21 LAB — THYROID PANEL WITH TSH
Free Thyroxine Index: 2.9 (ref 1.2–4.9)
T3 Uptake Ratio: 30 % (ref 24–39)
T4, Total: 9.8 ug/dL (ref 4.5–12.0)
TSH: 0.862 u[IU]/mL (ref 0.450–4.500)

## 2024-06-22 ENCOUNTER — Ambulatory Visit: Payer: Self-pay | Admitting: Nurse Practitioner

## 2024-06-27 ENCOUNTER — Other Ambulatory Visit: Payer: Self-pay | Admitting: Orthopedic Surgery

## 2024-06-27 DIAGNOSIS — M5412 Radiculopathy, cervical region: Secondary | ICD-10-CM

## 2024-07-04 ENCOUNTER — Ambulatory Visit: Admitting: Nurse Practitioner

## 2024-07-12 ENCOUNTER — Other Ambulatory Visit

## 2024-07-19 NOTE — Discharge Instructions (Signed)

## 2024-07-20 ENCOUNTER — Inpatient Hospital Stay
Admission: RE | Admit: 2024-07-20 | Discharge: 2024-07-20 | Disposition: A | Source: Ambulatory Visit | Attending: Orthopedic Surgery | Admitting: Orthopedic Surgery

## 2024-07-20 DIAGNOSIS — M5412 Radiculopathy, cervical region: Secondary | ICD-10-CM

## 2024-07-20 MED ORDER — TRIAMCINOLONE ACETONIDE 40 MG/ML IJ SUSP (RADIOLOGY)
60.0000 mg | Freq: Once | INTRAMUSCULAR | Status: AC
  Administered 2024-07-20: 60 mg via EPIDURAL

## 2024-07-20 MED ORDER — IOPAMIDOL (ISOVUE-M 300) INJECTION 61%
1.0000 mL | Freq: Once | INTRAMUSCULAR | Status: AC | PRN
  Administered 2024-07-20: 1 mL via EPIDURAL

## 2024-07-27 ENCOUNTER — Ambulatory Visit: Admitting: Nurse Practitioner

## 2024-07-27 ENCOUNTER — Encounter: Payer: Self-pay | Admitting: Nurse Practitioner

## 2024-07-27 VITALS — BP 118/65 | HR 70 | Temp 97.2°F | Ht 67.0 in | Wt 160.0 lb

## 2024-07-27 DIAGNOSIS — L989 Disorder of the skin and subcutaneous tissue, unspecified: Secondary | ICD-10-CM

## 2024-07-27 NOTE — Progress Notes (Signed)
° °  Subjective:    Patient ID: Alejandra Mcdonald, female    DOB: May 05, 1961, 63 y.o.   MRN: [id number]   Chief Complaint: mole removal   HPI  Patient has 5cm slightly raises seedy brown mole on midlower abdominal wall. Here for removal. Has not changed in size and color in many years. Patient Active Problem List   Diagnosis Date Noted   Primary hypertension 04/09/2023   Radiculopathy 04/24/2020   Hemorrhoidal skin tag 04/22/2019   Constipation 04/10/2013   Hypothyroidism 11/18/2012   Mixed diabetic hyperlipidemia associated with type 1 diabetes mellitus (HCC) 11/18/2012   Type 1 diabetes mellitus with other specified complication (HCC) 11/18/2012       Review of Systems  Constitutional:  Negative for diaphoresis.  Eyes:  Negative for pain.  Respiratory:  Negative for shortness of breath.   Cardiovascular:  Negative for chest pain, palpitations and leg swelling.  Gastrointestinal:  Negative for abdominal pain.  Endocrine: Negative for polydipsia.  Skin:  Negative for rash.  Neurological:  Negative for dizziness, weakness and headaches.  Hematological:  Does not bruise/bleed easily.  All other systems reviewed and are negative.      Objective:   Physical Exam  Skin excision  Date/Time: 07/27/2024 10:42 AM  Performed by: Gladis Mustard, FNP Authorized by: Gladis Mustard, FNP   Number of Lesions: 1 Lesion 1:    Body area: trunk   Trunk location: abdomen   Initial size (mm): 10   Final defect size (mm): 10   Malignancy: benign lesion     Graft type: split-thickness     Closure complexity: simple     Repair comments: Silvadene sticks used for cautery  Comments:  Patient tolerated well.        Assessment & Plan:   Alejandra Mcdonald in today with chief complaint of mole removal   1. Skin lesion (Primary) Wound care discussed Watch for signs of infection     The above assessment and management plan was discussed with the patient. The  patient verbalized understanding of and has agreed to the management plan. Patient is aware to call the clinic if symptoms persist or worsen. Patient is aware when to return to the clinic for a follow-up visit. Patient educated on when it is appropriate to go to the emergency department.   Mary-Margaret Gladis, FNP

## 2024-11-16 ENCOUNTER — Ambulatory Visit: Admitting: Nurse Practitioner
